# Patient Record
Sex: Female | Born: 1960 | Race: White | Hispanic: No | Smoking: Never smoker
Health system: Southern US, Community
[De-identification: ages and names within clinical notes are randomized; demographics above are authoritative.]

## PROBLEM LIST (undated history)

## (undated) DIAGNOSIS — M1990 Unspecified osteoarthritis, unspecified site: Secondary | ICD-10-CM

## (undated) DIAGNOSIS — R112 Nausea with vomiting, unspecified: Secondary | ICD-10-CM

## (undated) DIAGNOSIS — M419 Scoliosis, unspecified: Secondary | ICD-10-CM

## (undated) DIAGNOSIS — I8390 Asymptomatic varicose veins of unspecified lower extremity: Secondary | ICD-10-CM

## (undated) DIAGNOSIS — R51 Headache: Secondary | ICD-10-CM

## (undated) DIAGNOSIS — D649 Anemia, unspecified: Secondary | ICD-10-CM

## (undated) DIAGNOSIS — R21 Rash and other nonspecific skin eruption: Secondary | ICD-10-CM

## (undated) DIAGNOSIS — G629 Polyneuropathy, unspecified: Secondary | ICD-10-CM

## (undated) DIAGNOSIS — K219 Gastro-esophageal reflux disease without esophagitis: Secondary | ICD-10-CM

## (undated) DIAGNOSIS — M545 Low back pain, unspecified: Secondary | ICD-10-CM

## (undated) HISTORY — DX: Gastro-esophageal reflux disease without esophagitis: K21.9

## (undated) HISTORY — DX: Asymptomatic varicose veins of unspecified lower extremity: I83.90

## (undated) HISTORY — DX: Unspecified osteoarthritis, unspecified site: M19.90

## (undated) HISTORY — DX: Polyneuropathy, unspecified: G62.9

## (undated) HISTORY — PX: THYROID SURGERY: SHX805

## (undated) HISTORY — DX: Rash and other nonspecific skin eruption: R21

## (undated) HISTORY — DX: Low back pain, unspecified: M54.50

## (undated) HISTORY — DX: Scoliosis, unspecified: M41.9

## (undated) HISTORY — DX: Headache: R51

## (undated) HISTORY — DX: Nausea with vomiting, unspecified: R11.2

---

## 1991-04-20 HISTORY — PX: BREAST SURGERY: SHX581

## 2001-04-19 HISTORY — PX: BUNIONECTOMY: SHX129

## 2007-04-20 HISTORY — PX: ESOPHAGOGASTRODUODENOSCOPY: SHX1529

## 2010-08-02 ENCOUNTER — Ambulatory Visit: Admit: 2010-08-02 | Discharge: 2010-08-02 | Payer: Self-pay | Source: Ambulatory Visit

## 2011-02-18 HISTORY — PX: COLONOSCOPY: SHX174

## 2011-05-18 ENCOUNTER — Ambulatory Visit: Payer: BLUE CROSS/BLUE SHIELD | Attending: Foot & Ankle Surgery

## 2011-05-18 DIAGNOSIS — Z3480 Encounter for supervision of other normal pregnancy, unspecified trimester: Secondary | ICD-10-CM | POA: Insufficient documentation

## 2011-05-18 NOTE — Pre-Procedure Instructions (Signed)
FAXED [facility] FOR LAB/EKG/NOTES

## 2011-05-21 HISTORY — PX: ARTHRODESIS: SHX136

## 2011-05-28 NOTE — Pre-Procedure Instructions (Signed)
Labs,EKG HP/med clearance  05/11/11 scan/rev

## 2011-06-01 ENCOUNTER — Ambulatory Visit: Payer: BLUE CROSS/BLUE SHIELD | Admitting: Anesthesiology

## 2011-06-01 ENCOUNTER — Encounter
Admission: RE | Disposition: A | Discharge status: Home / Self Care | Payer: Self-pay | Source: Ambulatory Visit | Attending: Foot & Ankle Surgery

## 2011-06-01 ENCOUNTER — Ambulatory Visit: Payer: BLUE CROSS/BLUE SHIELD

## 2011-06-01 ENCOUNTER — Ambulatory Visit
Admission: RE | Admit: 2011-06-01 | Discharge: 2011-06-01 | Disposition: A | Discharge status: Home / Self Care | Payer: BLUE CROSS/BLUE SHIELD | Source: Ambulatory Visit | Attending: Foot & Ankle Surgery | Admitting: Foot & Ankle Surgery

## 2011-06-01 ENCOUNTER — Encounter: Payer: Self-pay | Admitting: Anesthesiology

## 2011-06-01 ENCOUNTER — Ambulatory Visit: Payer: BLUE CROSS/BLUE SHIELD | Admitting: Foot & Ankle Surgery

## 2011-06-01 DIAGNOSIS — M19079 Primary osteoarthritis, unspecified ankle and foot: Secondary | ICD-10-CM | POA: Insufficient documentation

## 2011-06-01 DIAGNOSIS — M2020 Hallux rigidus, unspecified foot: Secondary | ICD-10-CM | POA: Insufficient documentation

## 2011-06-01 SURGERY — ARTHRODESIS, TOE
Anesthesia: Anesthesia General | Site: Foot | Laterality: Left | Wound class: Clean

## 2011-06-01 MED ORDER — FAMOTIDINE 10 MG/ML IV SOLN
INTRAVENOUS | Status: DC | PRN
Start: 2011-06-01 — End: 2011-06-01
  Administered 2011-06-01: 20 mg via INTRAVENOUS

## 2011-06-01 MED ORDER — MIDAZOLAM HCL 2 MG/2ML IJ SOLN
INTRAMUSCULAR | Status: DC | PRN
Start: 2011-06-01 — End: 2011-06-01
  Administered 2011-06-01: 2 mg via INTRAVENOUS

## 2011-06-01 MED ORDER — MEPERIDINE HCL 25 MG/ML IJ SOLN
12.5000 mg | INTRAMUSCULAR | Status: DC | PRN
Start: 2011-06-01 — End: 2011-06-01

## 2011-06-01 MED ORDER — PROPOFOL 10 MG/ML IV EMUL
INTRAVENOUS | Status: DC | PRN
Start: 2011-06-01 — End: 2011-06-01
  Administered 2011-06-01: 100 mg via INTRAVENOUS

## 2011-06-01 MED ORDER — LACTATED RINGERS IV SOLN
INTRAVENOUS | Status: DC
Start: 2011-06-01 — End: 2011-06-01

## 2011-06-01 MED ORDER — PROPOFOL 10 MG/ML IV EMUL
INTRAVENOUS | Status: DC | PRN
Start: 2011-06-01 — End: 2011-06-01
  Administered 2011-06-01: 140 ug/kg/min via INTRAVENOUS

## 2011-06-01 MED ORDER — FENTANYL CITRATE 0.05 MG/ML IJ SOLN
50.0000 ug | INTRAMUSCULAR | Status: DC | PRN
Start: 2011-06-01 — End: 2011-06-01

## 2011-06-01 MED ORDER — DIPHENHYDRAMINE HCL 50 MG/ML IJ SOLN
12.5000 mg | Freq: Four times a day (QID) | INTRAMUSCULAR | Status: DC | PRN
Start: 2011-06-01 — End: 2011-06-01

## 2011-06-01 MED ORDER — SODIUM CHLORIDE 0.9 % IV MBP
1.0000 g | Freq: Once | INTRAVENOUS | Status: AC
Start: 2011-06-01 — End: 2011-06-01
  Administered 2011-06-01: 1 g via INTRAVENOUS

## 2011-06-01 MED ORDER — LIDOCAINE HCL 2 % IJ SOLN
INTRAMUSCULAR | Status: DC | PRN
Start: 2011-06-01 — End: 2011-06-01
  Administered 2011-06-01: 100 mg

## 2011-06-01 MED ORDER — KETOROLAC TROMETHAMINE 60 MG/2ML IM SOLN
INTRAMUSCULAR | Status: DC | PRN
Start: 2011-06-01 — End: 2011-06-01
  Administered 2011-06-01: 30 mg via INTRAMUSCULAR

## 2011-06-01 MED ORDER — SODIUM CHLORIDE 0.9 % IR SOLN
Status: DC | PRN
Start: 2011-06-01 — End: 2011-06-01
  Administered 2011-06-01: 1000 mL

## 2011-06-01 MED ORDER — PHENYLEPHRINE 100 MCG/ML IV BOLUS (ANESTHESIA)
PREFILLED_SYRINGE | INTRAVENOUS | Status: DC | PRN
Start: 2011-06-01 — End: 2011-06-01
  Administered 2011-06-01 (×3): 100 ug via INTRAVENOUS

## 2011-06-01 MED ORDER — ONDANSETRON HCL 4 MG/2ML IJ SOLN
INTRAMUSCULAR | Status: DC | PRN
Start: 2011-06-01 — End: 2011-06-01
  Administered 2011-06-01: 4 mg via INTRAVENOUS

## 2011-06-01 MED ORDER — BACITRACIN 50000 UNITS IM SOLR
INTRAMUSCULAR | Status: DC | PRN
Start: 2011-06-01 — End: 2011-06-01
  Administered 2011-06-01: 50000 [IU]

## 2011-06-01 MED ORDER — GLYCOPYRROLATE 0.2 MG/ML IJ SOLN
INTRAMUSCULAR | Status: DC | PRN
Start: 2011-06-01 — End: 2011-06-01
  Administered 2011-06-01: 0.2 mg via INTRAVENOUS

## 2011-06-01 MED ORDER — LACTATED RINGERS IV SOLN
INTRAVENOUS | Status: DC
Start: 2011-06-01 — End: 2011-06-01
  Administered 2011-06-01: 1000 mL via INTRAVENOUS

## 2011-06-01 MED ORDER — HYDROMORPHONE HCL PF 1 MG/ML IJ SOLN
0.5000 mg | INTRAMUSCULAR | Status: DC | PRN
Start: 2011-06-01 — End: 2011-06-01

## 2011-06-01 MED ORDER — PROMETHAZINE HCL 25 MG/ML IJ SOLN
6.2500 mg | Freq: Once | INTRAMUSCULAR | Status: DC | PRN
Start: 2011-06-01 — End: 2011-06-01

## 2011-06-01 MED ORDER — FENTANYL CITRATE 0.05 MG/ML IJ SOLN
INTRAMUSCULAR | Status: DC | PRN
Start: 2011-06-01 — End: 2011-06-01
  Administered 2011-06-01 (×2): 50 ug via INTRAVENOUS

## 2011-06-01 MED ORDER — OXYCODONE-ACETAMINOPHEN 5-325 MG PO TABS
1.0000 | ORAL_TABLET | ORAL | Status: DC | PRN
Start: 2011-06-01 — End: 2011-06-01

## 2011-06-01 MED ORDER — DEXAMETHASONE SODIUM PHOSPHATE 4 MG/ML IJ SOLN
INTRAMUSCULAR | Status: DC | PRN
Start: 2011-06-01 — End: 2011-06-01
  Administered 2011-06-01: 6 mg via INTRAVENOUS

## 2011-06-01 MED ORDER — LACTATED RINGERS IV SOLN
INTRAVENOUS | Status: DC | PRN
Start: 2011-06-01 — End: 2011-06-01

## 2011-06-01 MED ORDER — ONDANSETRON HCL 4 MG/2ML IJ SOLN
4.0000 mg | Freq: Once | INTRAMUSCULAR | Status: DC | PRN
Start: 2011-06-01 — End: 2011-06-01

## 2011-06-01 MED ORDER — BUPIVACAINE HCL 0.5 % IJ SOLN
INTRAMUSCULAR | Status: DC | PRN
Start: 2011-06-01 — End: 2011-06-01
  Administered 2011-06-01: 15 mL
  Administered 2011-06-01: 20 mL

## 2011-06-01 SURGICAL SUPPLY — 41 items
BLADE SAGITTAL 9.5X25.5X 4MM (Blade) ×1 IMPLANT
BLADE SHARP 15 DEG (Blade) ×4 IMPLANT
BNDG ACE ELASTIC 4IN STRL (Procedure Accessories) ×1 IMPLANT
BNDG ACE ELASTIC 6IN STRL (Procedure Accessories) ×1 IMPLANT
BUR DENTAL L48 MM OVAL 8 FLUTE OD4 MM BUSA CARBIDE L8 MM (Burr) IMPLANT
BUR RASP CROSSCUT TEAR 9X5MM (Burr) ×1 IMPLANT
BURR DNTL CRB OVL 4MM 48MM 8 FLUT 8MM (Burr) ×2 IMPLANT
CATH IV 14GX1.75IN NLTX (IV Supply) ×1 IMPLANT
DRAPE 84X54IN MINI C ARM OEC 6800 DISPOSABLE (Drape) IMPLANT
DRAPE CARM MN 84X54IN DISP OEC 6800 (Drape) ×2 IMPLANT
DRESSING FLEXZAN 4X4 (Dressing) IMPLANT
GAUZE KERLIX 4.5X4YDS (Dressing) ×1 IMPLANT
GLOVE SRG 6.5 BGL SNSR LTX STRL PF TXTR (Glove) ×2
GLOVE SRG NTR RBR 7 BGL IND 288X91MM LTX (Glove) ×2
GLOVE SURGICAL 6 1/2 BIOGEL SENSOR (Glove) ×1 IMPLANT
GLOVE SURGICAL 6 1/2 BIOGEL SENSOR POWDER FREE TEXTURE BEAD CUFF STRAW (Glove) IMPLANT
GLOVE SURGICAL 7 BIOGEL INDICATOR POWDER (Glove) ×1 IMPLANT
GLOVE SURGICAL 7 BIOGEL INDICATOR POWDER FREE SMOOTH BEAD CUFF (Glove) IMPLANT
GOWN X-LRG POLY REINFORCED (Gown) ×1 IMPLANT
K-WIRE W/BALL 1.6MM (Guide Wire) ×1 IMPLANT
KIT EXTREMITY LOWER *USE 45863 (Kits) ×1 IMPLANT
PAD GROUNDING B-6400 RED (Laser Supplies) ×1 IMPLANT
SCREW BN TI CPTR 4MM 28MM ST SLF DRL CNN (Screw) ×1 IMPLANT
SCREW BN TI CPTR 4MM 34MM ST SLF DRL CNN (Screw) IMPLANT
SCREW BN TI CPTR 4MM 38MM ST SLF DRL CNN (Screw) ×1 IMPLANT
SCREW L28 MM OD4 MM TITANIUM FOREFOOT MIDFOOT SELF TAP SELF DRILL (Screw) IMPLANT
SCREW L34 MM OD4 MM TITANIUM FOREFOOT MIDFOOT SELF TAP SELF DRILL (Screw) IMPLANT
SCREW L38 MM OD4 MM TITANIUM FOREFOOT MIDFOOT SELF TAP SELF DRILL (Screw) IMPLANT
SPLINT PLASTER EFS 5X30IN (Cast) ×1 IMPLANT
SPONGE GAUZE L6 3/4 IN X W6 IN MEDIUM (Dressing) ×1 IMPLANT
SPONGE GAUZE L6 3/4 IN X W6 IN MEDIUM ABSORBENT FLUFF DRY CRINKLE (Dressing) IMPLANT
SPONGE GZE CTTN MED KRLX 6.75X6IN LF (Dressing) ×2
SUTURE ABS 3-0 PS2 VCL MTPS 18IN BRD (Suture) ×2
SUTURE COATED VICRYL 3-0 PS-2 L18 IN (Suture) ×1 IMPLANT
SUTURE COATED VICRYL 3-0 PS-2 L18 IN BRAID COATED UNDYED ABSORBABLE (Suture) IMPLANT
SUTURE MONOCRYL 4-0 PS2 27IN (Suture) ×1 IMPLANT
SUTURE VICRYL 4-0 PS2 27IN (Suture) ×1 IMPLANT
SYRINGE 20 ML BD LUER-LOK MEDICAL (Syringes, Needles) IMPLANT
SYRINGE MED 20ML LL LF STRL (Syringes, Needles) ×2 IMPLANT
TOURNIQUET 34IN STRL (Procedure Accessories) ×1 IMPLANT
TOWEL STERILE REUSABLE 8PK (Procedure Accessories) ×1 IMPLANT

## 2011-06-01 NOTE — Progress Notes (Signed)
Patient signed BETA waiver

## 2011-06-01 NOTE — Anesthesia Preprocedure Evaluation (Addendum)
Anesthesia Evaluation    AIRWAY    Mallampati: II    TM distance: >3 FB  Neck ROM: full  Mouth Opening:full   CARDIOVASCULAR    cardiovascular exam normal     DENTAL    No notable dental hx     PULMONARY    pulmonary exam normal     OTHER FINDINGS          Anesthesia Plan    ASA 2   general   Detailed anesthesia plan: general LMA      Post op pain management: per surgeon  intravenous induction   informed consent obtained        <IHSANLANDD>    Outside labs & EKG reviewed  GERD sx controlled with Dexilant

## 2011-06-01 NOTE — Discharge Instructions (Signed)
Surgeon's  Instructions    1. Keep dressing/operative area clean and dry.  2.   Weight Bearing status:Non weightbearing left foot  3. If swelling, redness, heat, and/or drainage occur at          the incision site, Call Your Doctor.  4. To lessen swelling and pain:  elevate the affected          leg, and apply ice packs over the dressing.  Put the         ice in a plastic bag with a wash cloth around the bag.   5 Low grade fever is normal up to 48 hours after   surgery.  Call  your doctor for any temperature    over 101.  6.   Take pain medication as prescribed  7.   No driving until cleared by your physician   8.   Advance your diet as tolerated      Anesthesia: After Your Surgery  You've just had surgery. During surgery, you received medication called anesthesia to keep you comfortable and pain-free. After surgery, you may experience some pain or nausea. This is normal. Here are some tips for feeling better and recovering after surgery.      Stay on schedule with your medication.   Going Home  Your doctor or nurse will show you how to take care of yourself when you go home. He or she will also answer your questions. Have an adult family member or friend drive you home. For the first 24 hours after your surgery:   Do not drive or use heavy equipment.   Do not make important decisions or sign documents.   Avoid alcohol.   Have someone stay with you, if possible. They can watch for problems and help keep you safe.  Be sure to keep all follow-up doctor's appointments. And rest after your procedure for as long as your doctor tells you to.  Coping with Pain  If you have pain after surgery, pain medication will help you feel better. Take it as directed, before pain becomes severe. Also, ask your doctor or pharmacist about other ways to control pain, such as with heat, ice, and relaxation. And follow any other instructions your surgeon or nurse gives you.  Tips for Taking Pain Medication  To get the best relief  possible, remember these points:   Pain medications can upset your stomach. Taking them with a little food may help.   Most pain relievers taken by mouth need at least 20 to 30 minutes to take effect.   Taking medication on a schedule can help you remember to take it. Try to time your medication so that you can take it before beginning an activity, such as dressing, walking, or sitting down for dinner.   Constipation is a common side effect of pain medications. Drink lots of fluids. Eating fruit and vegetables can also help. Don't take laxatives unless your surgeon has prescribed them.   Mixing alcohol and pain medication can cause dizziness and slow your breathing. It can even be fatal. Don't drink alcohol while taking pain medication.   Pain medication can slow your reflexes. Don't drive or operate machinery while taking pain medication.  Managing Nausea  Some people have an upset stomach after surgery. This is often due to anesthesia, pain, pain medications, or the stress of surgery. The following tips will help you manage nausea and get good nutrition as you recover. If you were on a special diet before  surgery, ask your doctor if you should follow it during recovery. These tips may help:   Don't push yourself to eat. Your body will tell you what to eat and when.   Start off with liquids and soup. They are easier to digest.   Progress to semisolids (mashed potatoes, applesauce, and gelatin) as you feel ready.   Slowly move to solid foods. Don't eat fatty, rich, or spicy foods at first.   Don't force yourself to have three large meals a day. Instead, eat smaller amounts more often.   Take pain medications with a small amount of solid food, such as crackers or toast.  Call Your Surgeon If.   You still have pain an hour after taking medication (it may not be strong enough).   You feel too sleepy, dizzy, or groggy (medication may be too strong).   You have side effects like nausea, vomiting, or skin  changes (rash, itching, or hives).    [address] [place], [place] [address]. All rights reserved. This information is not intended as a substitute for professional medical care. Always follow your healthcare professional's instructions.    GENERAL DISCHARGE INSTRUCTIONS    Things to call your surgeon for:  Persistent Nausea and Vomiting  Chills or a Fever above 101 degrees F  Persistent bleeding, swelling or pus at the operative site  Unable to urinate in 8 hours  Pain that is not relieved by the pain medication.  Loss of feeling or inability to move fingers/toes on the surgical extremity  Blue color of nails or skin on the surgical extremity  Increased coldness of skin on the surgical extremity  Increased swelling of the surgical extremity, especially below the dressing/cast.  Increasing or severe pain, not relieved by your pain medication.You may not drive or do anything requiring coordination or balance for 24 hours.  Rest for the rest of the day.  Avoid heavy lifting for 2 weeks after any surgery.        You may not drink alcohol or consume non-prescribed sedatives or tranquilizers for 24 hours unless approved by your physician.    You should not sign important papers or make important decisions in the next 24 hours.    Please have someone responsible with you the first night you are home.    Keep your dressing/wound site clean and dry.  Do not remove the dressing until advised by your physician.  Wash your hands frequently before and after touching your surgical site.    Begin your diet with clear liquids and progress to your normal diet as long as you are not nauseated. It is suggested that you avoid greasy or spicy foods.    It is suggested that unless specified by the pharmacist, you take all medications with food.  All narcotic type pain medications can cause constipation, keep fluid intake up and increase fiber in your diet.

## 2011-06-01 NOTE — PACU (Signed)
Assisted pt to bathroom, successful at voiding

## 2011-06-01 NOTE — Interval H&P Note (Signed)
H&P reviewed and updated  No changes   OR today for right 1st MPJ fusion  Consents signed  Pt will follow up with Dr. Latanya Presser in one week.    Gaylyn Rong, PGY2

## 2011-06-01 NOTE — Anesthesia Postprocedure Evaluation (Signed)
Anesthesia Post Evaluation    Patient: [name]    Procedures performed: Procedure(s) with comments:  ARTHRODESIS, TOE - LEFT FOOT 1ST MTPJ (METAPHALANGEAL JOINT) ARTHRODESIS    Anesthesia type: General TIVA    Patient location:Phase II PACU    Last vitals:   Filed Vitals:    06/01/11 1202   BP: 109/66   Pulse: 86   Temp: 97 F (36.1 C)   Resp: 16       Post pain: Patient not complaining of pain, continue current therapy     Mental Status:awake    Respiratory Function: tolerating room air    Cardiovascular: stable    Nausea/Vomiting: patient not complaining of nausea or vomiting    Hydration Status: adequate    Post assessment: no apparent anesthetic complications

## 2011-06-01 NOTE — Transfer of Care (Signed)
Anesthesia Transfer of Care Note    Patient: [name]    Procedures performed: Procedure(s) with comments:  ARTHRODESIS, TOE - LEFT FOOT 1ST MTPJ (METAPHALANGEAL JOINT) ARTHRODESIS    Anesthesia type: General TIVA    Patient location:Phase II PACU    Last vitals:   Filed Vitals:    06/01/11 1201   BP: 109/66   Pulse: 98   Temp:    Resp: 16       Post pain: Patient not complaining of pain, continue current therapy     Mental Status:awake    Respiratory Function: tolerating room air    Cardiovascular: stable    Nausea/Vomiting: patient not complaining of nausea or vomiting    Hydration Status: adequate    Post assessment: no apparent anesthetic complications

## 2011-06-01 NOTE — Brief Op Note (Signed)
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.2] left foot    Procedure:   Left 1st MPJ arthrodesis      Anesthesia:    General   [name], MD   Anesthesiologist: [name], MD  CRNA: Naida Sleight, CRNA     Estimated Blood Loss:   Less than 10 cc    Hemostasis: ankle tourniquet at    Implants:     Implants     Screw    Integra Screw - IMPLANTED     Inventory item:  Model/Cat no.: [id number]    Serial no.:  Manufacturer:     Lot no.:  Size:           Screw Low Pro Canu 4.0x46mm - [id number] - IMPLANTED     Inventory item: Screw Low Pro Canu 4.0x61mm Model/Cat no.: [id number]    Serial no.:  Manufacturer: [employer]    Lot no.:  Size:                    Injectables: 20cc 0.5%marcaine preop                        15cc 0.5% marcaine postop    Pathology: poor bone stock of 1st metatarsal head    Antibiotics:   Ancef 1g IV    Complications:   None.    Condition:   Stable to PACU                                                                            Geraldine Solar Keir Viernes

## 2011-06-01 NOTE — PACU (Signed)
Assisted pt to bathroom, successful at voiding. Pt tolerating coke and crackers

## 2011-06-04 NOTE — Op Note (Signed)
Procedure Date: 06/01/2011     Patient Type: A     SURGEON: MyHoa Kaas DPM  ASSISTANT:  Geraldine Solar Lavel Rieman DPM     PREOPERATIVE DIAGNOSIS:  1.  Hallux rigidus, left foot.    2.  Left foot arthritis.     POSTOPERATIVE DIAGNOSIS:  1.  Hallux rigidus, left foot.    2.  Left foot arthritis.     TITLE OF PROCEDURE:  Left first metatarsophalangeal joint arthrodesis.     ANESTHESIA:  General.     ESTIMATED BLOOD LOSS:  Less than 10 mL.     HEMOSTASIS:  Ankle tourniquet at 200 mmHg.     MATERIALS:  4.0 x 38 mm Integra screw and 4.0 x 28 mm Integra screw and 0.062 K-wire.     ANTIBIOTICS:  Ancef 1 gram.     COMPLICATIONS:  None.     CONDITION:  Stable to PACU.     INDICATION FOR PROCEDURE:  The patient is well known to the practice of Dr. Latanya Presser.  All conservative  measures have failed for painful first metatarsophalangeal joint and  conservative measures have been advised.  All risks, complications,  advantages and disadvantages have been thoroughly discussed.  All consents  have been signed preoperatively.     DESCRIPTION OF PROCEDURE:  The patient was brought into the operating room, placed on the operating  table in supine position.  The patient was then placed under general  anesthesia.  An ankle tourniquet was placed around the left ankle and a  preoperative injection of 20 mL of 0.5% Marcaine plain was injected into  the left foot in a standard Mayo blocking fashion.  The left foot was  scrubbed, prepped and draped in usual aseptic fashion.  The extremity was elevated for 2 minutes  and the ankle tourniquet was inflated to 200 mmHg.     Using a 15 blade, the skin was incised, careful to avoid any neurovascular  structures over the dorsal aspect of the first metatarsophalangeal joint.   The incision was then deepened down to the level of bone and capsular  tissue was reflected from the first metatarsal head and base of the  proximal phalanx.  Extremely denuded the cartilage of the first metatarsal  head was noted  along with large osteophytes.  Very poor bone quality was  visualized intraoperatively.  A curette was used to remove all cartilage  from the head of the first metatarsal as well as the base of the proximal  phalanx.  Curettage was carried out to the level of the subchondral plate.   The power bur was then used to further resect any cartilage.  A 0.045  K-wire was used to fenestrate the head of the first metatarsal and base of  the proximal phalanx.  The toe was then placed into proper fusion position  which was approximately 15 degrees of dorsiflexion and 10 degrees of  abduction and 0 degrees of rotation in the frontal plane.  A 0.062 K-wire  was thrown from distal to proximal percutaneously to stabilize the fusion  site for fixation.  Using fluoroscopy, the fusion site was visualized and  noted to be in good apposition.  Next, a 4.0 x 38 mm Integra screw was  inserted from proximal medial to distal lateral, using the standard AO  technique.  Good compression across the osteotomy site was achieved but the  bone was noted to be extremely soft.  The second screw from proximal  lateral to distal plantar  was then inserted again using standard AO  technique.  Again, fluoroscopy was used to check placement of the screws  and adequate correction and apposition was achieved.  Due to the poor  quality of the bone, the K-wire was used as a guidepin was left in place  and will be pulled later date and Dr. Latanya Presser' office.     The wounds were then flushed with copious amounts of normal saline and  reinspected for closure.  Capsular tissue was reapproximated using 3-0  Vicryl, subcutaneous using 4-0 Vicryl, and skin closure was achieved using  4-0 Monocryl in a subcuticular running fashion.  The ankle tourniquet was  dropped and a hyperemic response was noted to all digits of the left foot.   The wounds were dressed with silk soaked in Betadine, 4 x 4 gauze, Kerlix  and a short plaster splint was applied with Ace bandages.  The  patient was  transferred to PACU with vital signs stable and vascular status intact to  all digits of the left foot.  Following a period of postoperative  monitoring, the patient will be discharged home in stable condition.  She  will follow up with Dr. Latanya Presser next week.           D:  06/03/2011 18:01 PM by Dr. Geraldine Solar. [place], DPM [phone])  T:  06/04/2011 11:56 AM by [facility]      Everlean Cherry: [id number]) (Doc ID: [id number])

## 2011-08-16 ENCOUNTER — Encounter (INDEPENDENT_AMBULATORY_CARE_PROVIDER_SITE_OTHER): Payer: Self-pay

## 2011-08-17 ENCOUNTER — Telehealth: Payer: BLUE CROSS/BLUE SHIELD

## 2011-08-17 NOTE — Pre-Procedure Instructions (Signed)
Will have pre-op labs at [facility] tomorrow --requested by fax

## 2011-08-30 NOTE — Pre-Procedure Instructions (Signed)
Called surgeon's office requesting/lab done 08/18/11 and H&P

## 2011-08-30 NOTE — Pre-Procedure Instructions (Signed)
Rev/Scanned/Lab/Old H&P

## 2011-08-31 ENCOUNTER — Ambulatory Visit: Payer: BLUE CROSS/BLUE SHIELD | Admitting: Foot & Ankle Surgery

## 2011-08-31 ENCOUNTER — Encounter: Payer: Self-pay | Admitting: Anesthesiology

## 2011-08-31 ENCOUNTER — Ambulatory Visit: Payer: BLUE CROSS/BLUE SHIELD | Admitting: Anesthesiology

## 2011-08-31 ENCOUNTER — Other Ambulatory Visit: Payer: BLUE CROSS/BLUE SHIELD | Admitting: Radiology

## 2011-08-31 ENCOUNTER — Other Ambulatory Visit: Payer: BLUE CROSS/BLUE SHIELD

## 2011-08-31 ENCOUNTER — Encounter
Admission: RE | Disposition: A | Discharge status: Home / Self Care | Payer: Self-pay | Source: Ambulatory Visit | Attending: Foot & Ankle Surgery

## 2011-08-31 ENCOUNTER — Ambulatory Visit
Admission: RE | Admit: 2011-08-31 | Discharge: 2011-08-31 | Disposition: A | Discharge status: Home / Self Care | Payer: BLUE CROSS/BLUE SHIELD | Source: Ambulatory Visit | Attending: Foot & Ankle Surgery | Admitting: Foot & Ankle Surgery

## 2011-08-31 DIAGNOSIS — Y831 Surgical operation with implant of artificial internal device as the cause of abnormal reaction of the patient, or of later complication, without mention of misadventure at the time of the procedure: Secondary | ICD-10-CM | POA: Insufficient documentation

## 2011-08-31 DIAGNOSIS — T8489XA Other specified complication of internal orthopedic prosthetic devices, implants and grafts, initial encounter: Secondary | ICD-10-CM | POA: Insufficient documentation

## 2011-08-31 DIAGNOSIS — M79609 Pain in unspecified limb: Secondary | ICD-10-CM | POA: Insufficient documentation

## 2011-08-31 DIAGNOSIS — Z882 Allergy status to sulfonamides status: Secondary | ICD-10-CM | POA: Insufficient documentation

## 2011-08-31 LAB — POCT PREGNANCY TEST, URINE HCG: POCT Pregnancy HCG Test, UR: NEGATIVE

## 2011-08-31 SURGERY — REMOVAL, HARDWARE, SIMPLE, NON-SPINE
Anesthesia: Anesthesia General | Site: Foot | Laterality: Left | Wound class: Clean

## 2011-08-31 MED ORDER — PROPOFOL INFUSION 10 MG/ML
INTRAVENOUS | Status: DC | PRN
Start: 2011-08-31 — End: 2011-08-31
  Administered 2011-08-31: 100 ug/kg/min via INTRAVENOUS

## 2011-08-31 MED ORDER — SODIUM CHLORIDE 0.9 % IR SOLN
Status: DC | PRN
Start: 2011-08-31 — End: 2011-08-31
  Administered 2011-08-31: 250 mL

## 2011-08-31 MED ORDER — ONDANSETRON HCL 4 MG/2ML IJ SOLN
INTRAMUSCULAR | Status: AC
Start: 2011-08-31 — End: 2011-08-31
  Administered 2011-08-31: 4 mg via INTRAVENOUS
  Filled 2011-08-31: qty 2

## 2011-08-31 MED ORDER — HYDROMORPHONE HCL 2 MG PO TABS
2.0000 mg | ORAL_TABLET | ORAL | Status: DC | PRN
Start: 2011-08-31 — End: 2011-08-31

## 2011-08-31 MED ORDER — CEFAZOLIN SODIUM 1 G IJ SOLR
1.00 g | INTRAMUSCULAR | Status: DC
Start: 2011-08-31 — End: 2011-08-31
  Administered 2011-08-31: 1 g via INTRAVENOUS

## 2011-08-31 MED ORDER — FENTANYL CITRATE 0.05 MG/ML IJ SOLN
25.0000 ug | Freq: Once | INTRAMUSCULAR | Status: AC
Start: 2011-08-31 — End: 2011-08-31

## 2011-08-31 MED ORDER — HYDROMORPHONE HCL PF 1 MG/ML IJ SOLN
0.5000 mg | INTRAMUSCULAR | Status: DC | PRN
Start: 2011-08-31 — End: 2011-08-31

## 2011-08-31 MED ORDER — BUPIVACAINE HCL 0.5 % IJ SOLN
INTRAMUSCULAR | Status: DC | PRN
Start: 2011-08-31 — End: 2011-08-31
  Administered 2011-08-31 (×2): 10 mL

## 2011-08-31 MED ORDER — LACTATED RINGERS IV SOLN
INTRAVENOUS | Status: DC
Start: 2011-08-31 — End: 2011-08-31
  Administered 2011-08-31: 1000 mL via INTRAVENOUS

## 2011-08-31 MED ORDER — FENTANYL CITRATE 0.05 MG/ML IJ SOLN
INTRAMUSCULAR | Status: DC | PRN
Start: 2011-08-31 — End: 2011-08-31
  Administered 2011-08-31: 25 ug via INTRAVENOUS

## 2011-08-31 MED ORDER — ONDANSETRON HCL 4 MG/2ML IJ SOLN
4.0000 mg | Freq: Once | INTRAMUSCULAR | Status: DC | PRN
Start: 2011-08-31 — End: 2011-08-31

## 2011-08-31 MED ORDER — MIDAZOLAM HCL 2 MG/2ML IJ SOLN
INTRAMUSCULAR | Status: DC | PRN
Start: 2011-08-31 — End: 2011-08-31
  Administered 2011-08-31: 2 mg via INTRAVENOUS

## 2011-08-31 MED ORDER — FENTANYL CITRATE 0.05 MG/ML IJ SOLN
INTRAMUSCULAR | Status: AC
Start: 2011-08-31 — End: 2011-08-31
  Administered 2011-08-31: 25 ug via INTRAVENOUS
  Filled 2011-08-31: qty 2

## 2011-08-31 MED ORDER — PROPOFOL INFUSION 10 MG/ML
INTRAVENOUS | Status: DC | PRN
Start: 2011-08-31 — End: 2011-08-31
  Administered 2011-08-31: 100 mg via INTRAVENOUS

## 2011-08-31 MED ORDER — FENTANYL CITRATE 0.05 MG/ML IJ SOLN
25.0000 ug | INTRAMUSCULAR | Status: DC | PRN
Start: 2011-08-31 — End: 2011-08-31

## 2011-08-31 MED ORDER — LIDOCAINE HCL 2 % IJ SOLN
INTRAMUSCULAR | Status: DC | PRN
Start: 2011-08-31 — End: 2011-08-31
  Administered 2011-08-31: 60 mg

## 2011-08-31 MED ORDER — PROMETHAZINE HCL 25 MG/ML IJ SOLN
6.2500 mg | Freq: Once | INTRAMUSCULAR | Status: DC | PRN
Start: 2011-08-31 — End: 2011-08-31

## 2011-08-31 MED ORDER — PHENYLEPHRINE 100 MCG/ML IV BOLUS (ANESTHESIA)
PREFILLED_SYRINGE | INTRAVENOUS | Status: DC | PRN
Start: 2011-08-31 — End: 2011-08-31
  Administered 2011-08-31 (×2): 100 ug via INTRAVENOUS

## 2011-08-31 MED ORDER — LACTATED RINGERS IV SOLN
20.0000 mL/h | INTRAVENOUS | Status: DC | PRN
Start: 2011-08-31 — End: 2011-08-31

## 2011-08-31 SURGICAL SUPPLY — 49 items
APPLCATOR CHLORAPREP 26ML (Prep) IMPLANT
BANDAGE CMPR PLSTR CTTN PRCR 5YDX4IN LF (Procedure Accessories) ×2
BANDAGE PROCARE COMP L5 YD X W4 IN 2 CLIP FASTENER SLF CLSR PLSTR CTTN (Procedure Accessories) ×1 IMPLANT
BANDAGE PROCARE COMPRESSION L5 YD X W4 (Procedure Accessories) ×1 IMPLANT
BLADE S/SU RIBBACK CARB STL 15 (Blade) ×4 IMPLANT
CATH IV 14GX1.75IN NLTX (IV Supply) ×2 IMPLANT
DRAPE SRG TBRN CNVRT 108X77IN STRL REINF (Drape) ×2
DRAPE SURGICAL REINFORCE SPLIT (Drape) ×1 IMPLANT
DRAPE SURGICAL REINFORCE SPLIT IMPERVIOUS L108 IN X W77 IN CONVERTORS (Drape) ×1 IMPLANT
DRESSING OWENS GZE STRL 3X8IN (Dressing) ×1
DRESSING WND DERMACEA 8X3IN LF STRL NADH (Dressing) ×1 IMPLANT
DRESSING WOUND 8X3IN DERMACEA NONADHSV LF STRL DSPSBL (Dressing) ×1 IMPLANT
GAUZE KERLIX 4.5X4YDS (Dressing) ×2 IMPLANT
GLOVE SRG NTR RBR 7 BGL IND 288X91MM LTX (Glove) ×2
GLOVE SURG BIOGEL SZ6.5 (Glove) ×2 IMPLANT
GLOVE SURGICAL 7 BIOGEL INDICATOR POWDER (Glove) ×1 IMPLANT
GLOVE SURGICAL 7 BIOGEL INDICATOR POWDER FREE SMOOTH BEAD CUFF (Glove) ×1 IMPLANT
INACTIVE USE LAWSON 112416 (Drape) ×2 IMPLANT
INACTIVE USE LAWSON 120900 (Gown) ×4 IMPLANT
NEEDLE 25GA 1 1/2 (Needles) ×2 IMPLANT
NEEDLE REG BEVEL 19GX1.5IN (Needles) ×2 IMPLANT
PACK DRAPE C-ARM FIT MINI-6600 (Pack) ×2 IMPLANT
PAD ELECTROSRG GRND REM W CRD (Procedure Accessories) ×2 IMPLANT
PAD PREP CUFF 24X41IN W 9IN (Prep) ×2 IMPLANT
SLEEVE SEQUEN COMP KNEE REG (Procedure Accessories) ×2 IMPLANT
SOL NACL .9% IRRIG 250ML NLTX (IV Solutions) ×1
SOLUTION IRR 0.9% NACL 250ML LF STRL PLS (IV Solutions) ×1
SOLUTION IRRIGATION 0.9% SODIUM CHLORIDE (IV Solutions) ×1 IMPLANT
SOLUTION IRRIGATION 0.9% SODIUM CHLORIDE 250 ML PLASTIC POUR BOTTLE (IV Solutions) ×1 IMPLANT
SPONGE GAUZE L4 IN X W4 IN 16 PLY (Dressing) ×1 IMPLANT
SPONGE GAUZE L4 IN X W4 IN 16 PLY MAXIMUM ABSORBENT USP TYPE VII (Dressing) IMPLANT
SPONGE GAUZE L6 3/4 IN X W6 IN MEDIUM (Dressing) ×1 IMPLANT
SPONGE GAUZE L6 3/4 IN X W6 IN MEDIUM ABSORBENT FLUFF DRY CRINKLE (Dressing) ×1 IMPLANT
SPONGE GZE CTTN CRTY 4X4IN LF NS 16 PLY (Dressing) ×2
SPONGE GZE CTTN MED KRLX 6.75X6IN LF (Dressing) ×2
STRIP SKIN CLOSURE L4 IN X W1/4 IN (Dressing) ×1 IMPLANT
STRIP SKIN CLOSURE L4 IN X W1/4 IN REINFORCE STERI-STRIP POLYESTER (Dressing) ×1 IMPLANT
STRIP SKNCLS PLSTR STRSTRP 4X.25IN LF (Dressing) ×1
STRIP STRSTRP SKNCLS 4X.25IN PLSTR REINF (Dressing) ×1
SUTURE MONOCRYL 4-0 PS2 27IN (Suture) ×2 IMPLANT
SUTURE VICRYL 3-0 PS2 27IN (Suture) ×2 IMPLANT
SUTURE VICRYL 4-0 PS2 27IN (Suture) ×2 IMPLANT
SYRINGE 20 ML BD LUER-LOK MEDICAL (Syringes, Needles) ×1 IMPLANT
SYRINGE LUER LOCK 10CC (Syringes, Needles) ×2 IMPLANT
SYRINGE MED 20ML LL LF STRL (Syringes, Needles) ×2 IMPLANT
TOURNIQUET 18IN STRL (Procedure Accessories) ×2 IMPLANT
TOURNIQUET 34IN STRL (Procedure Accessories) ×2 IMPLANT
TOWEL STERILE 6-PACK (Procedure Accessories) ×2 IMPLANT
TRAY EXTREMITY PACK (Pack) ×2 IMPLANT

## 2011-08-31 NOTE — Brief Op Note (Signed)
BRIEF OP NOTE    Date Time: 08/31/2011 2:06 PM    Patient Name:   [name]    Date of Operation:   08/31/2011    Providers Performing:   Surgeon(s):  Myhoa Latanya Presser, DPM    Assistant (s):   Earna Coder PGY1  Jimmey Ralph, RN - Circulator  Nicholos Johns Czesniuk - Scrub Person  Antoine Primas, RN - Second Circulator    Operative Procedure:   Procedure(s):  Left foot REMOVAL, HARDWARE, SIMPLE, NON-SPINE    Preoperative Diagnosis:   Pre-Op Diagnosis Codes:     * Unspecified mechanical complication of internal orthopedic device, implant, and graft [996.40]    Postoperative Diagnosis:    Unspecified mechanical complication of internal orthopedic device, implant, and graft [996.40]    Anesthesia:   Monitor Anesthesia Care    Estimated Blood Loss:    minimal    Implants:   none    Drains:   none    Specimens:   Screws x 2     Findings:   See full op report    Complications:   none      Signed by: Earna Coder, DPM                                                                           [facility] OR

## 2011-08-31 NOTE — H&P (Signed)
PODIATRIC SURGERY HISTORY AND PHYSICAL   Spectra: [id number]   Pager: [phone]  Attending: Dr. Latanya Presser  Date/Time: 08/31/2011 1:12 PM    08/31/2011 1:12 PM    History of Presenting Illness:    [name] is a 51 y.o. year old female with pmh of migraines, GERD and spinal stenosis (no focal deficits) presents today for removal of hardware from Left foot 1st MTPJ. Had the fusion done on Feb of 2013, now it's painful. Complains of migraine today.     Past Medical History:     Past Medical History   Diagnosis Date   . PONV (postoperative nausea and vomiting)    . GERD (gastroesophageal reflux disease)    . Low back pain    . Arthritis      THUMB   . Headache      MIGRAINES   . Rash      PSORIASIS     Past Surgical History:     Past Surgical History   Procedure Date   . Bunionectomy 2003   . Esophagogastroduodenoscopy      X3   . Colonoscopy 02/2011   . Thyroid surgery age 60     benign mass removed   . Arthrodesis Feb 2013     Left foot     Family History:   History reviewed. No pertinent family history.  Social History:     History     Social History   . Marital Status: Single     Spouse Name: N/A     Number of Children: N/A   . Years of Education: N/A     Social History Main Topics   . Smoking status: Never Smoker    . Smokeless tobacco: Never Used   . Alcohol Use: 1.8 oz/week     3 Glasses of wine per week   . Drug Use: No   . Sexually Active:      Other Topics Concern   . Not on file     Social History Narrative   . No narrative on file     Allergies:     Allergies   Allergen Reactions   . Sulfa Antibiotics Rash and Fever     Medications:     Prior to Admission medications    Medication Sig Start Date End Date Taking? Authorizing Provider   CALCIUM PO Take 1 tablet by mouth daily. CHEWABLE    Yes Historical Provider, MD   carisoprodol (SOMA) 250 MG tablet Take 250 mg by mouth nightly.     Yes Historical Provider, MD   clobetasol (OLUX) 0.05 % topical foam Apply topically as needed.     Yes Historical Provider, MD    dexlansoprazole 60 MG capsule Take 60 mg by mouth daily.    Yes Historical Provider, MD   Diclofenac Epolamine (FLECTOR) 1.3 % PTCH Place onto the skin daily.   Yes Historical Provider, MD   Diclofenac Sodium (VOLTAREN) 1 % GEL Apply topically as needed.     Yes Historical Provider, MD   gabapentin (NEURONTIN) 300 MG capsule Take 300 mg by mouth 3 (three) times daily. 2 SCHEDULED DOSES WITH 1 AS NEEDED 3RD DOSE    Yes Historical Provider, MD   Multiple Vitamins-Minerals (MULTIVITAMIN PO) Take 1 tablet by mouth daily.     Yes Historical Provider, MD   SUMAtriptan (IMITREX) 100 MG tablet Take 100 mg by mouth as needed.     Yes Historical Provider, MD   SUMAtriptan Succinate (IMITREX  IJ) Inject 4 mg as directed as needed.     Yes Historical Provider, MD   Diclofenac Potassium (CAMBIA) 50 MG PACK Take 1 packet by mouth as needed.      Historical Provider, MD     Review of Systems:   ROS performed and patient reports all pertinent systems negative  Physical Exam:   Blood pressure 123/65, pulse 87, temperature 98.3 F (36.8 C), temperature source Temporal Artery, resp. rate 16, height 1.702 m (5\' 7" ), weight 67.132 kg (148 lb), last menstrual period 09/18/2010, SpO2 99.00%.  67.132 kg (148 lb)   1.702 m (5\' 7" )  Estimated Body mass index is 23.18 kg/(m^2) as calculated from the following:    Height as of this encounter: 5\' 7" (1.702 m).    Weight as of this encounter: 148 lb(67.132 kg).  Constitutional: Well-appearing, NAD  Chest: Normal chest rise, CTAB  Cardiovascular: Regular rate rhythm, S1, S2  Abdomen: Soft, non-tender, +BS  Neuro: Alert, oriented  Focused LLE Exam:   Vascular: Palpable DP/PT, Dopplerable signals,  CFT < 3 secs, - edema   Derm: Supple skin, no open lesions. Prominent HW over the 1st MTPJ  Ortho: Gross motor function intact. + tenderness over 1st MPJ.  Neuro: Light touch and Protective sensation intact.    LABORATORY RESULTS:   Hematology (most recent):  No results found for this basename: WBC,  HCT, HGB, LABPLAT   Chemistry (most recent):  No results found for this basename: NA, K, CL, CO2, BUN, CREAT, GLU, CA, MG, PHOS   Coagulation Profile (most recent):  No results found for this basename: PT, PTT, INR   Hepatic Profile (most recent):  No results found for this basename: ALP, AST, ALT, BILIT, BILID, BILII   Nutrition Profile (last two values):  No results found for this basename: PREALBUMIN, ALB     Pathology  Specimens     None        RADIOLOGY:   Relevant, recent radiological imaging results reviewed.  CHART DOCUMENTED PROBLEM LIST:   Painful hardware  Assessment & Plan:   [name] is a 51 y.o. female    Patient has been evaluated and is deemed safe to proceed forward with the procedure discussed.   The patient has been given ample opportunity to ask appropriate questions and those questions have been answered to their satisfaction.   The consent form has been signed, witnessed and placed into the chart...  ______________________________    Earna Coder, DPM  Podiatric Surgery Resident, PGY-1  [phone]  ______________________________

## 2011-08-31 NOTE — Anesthesia Preprocedure Evaluation (Signed)
Anesthesia Evaluation    AIRWAY    Mallampati: II    TM distance: <3 FB  Neck ROM: limited  Mouth Opening:full   CARDIOVASCULAR    cardiovascular exam normal     DENTAL    No notable dental hx     PULMONARY    pulmonary exam normal     OTHER FINDINGS              Anesthesia Plan    ASA 2   general   Detailed anesthesia plan: general IV      Post op pain management: per surgeon  intravenous induction   informed consent obtained    Plan discussed with CRNA.

## 2011-08-31 NOTE — Anesthesia Postprocedure Evaluation (Signed)
Anesthesia Post Evaluation    Patient: [name]    Procedures performed: Procedure(s) with comments:  REMOVAL, HARDWARE, SIMPLE, NON-SPINE - LEFT FOOT HARDWARE REMOVAL X2    Anesthesia type: General TIVA    Patient location:Phase II PACU    Last vitals:   Filed Vitals:    08/31/11 1434   BP: 112/71   Pulse: 80   Temp:    Resp: 16   SpO2: 99%       Post pain: Continue adjustment of pain medication;      Mental Status:awake and alert     Respiratory Function: toleratinig nasal cannula    Cardiovascular: stable    Nausea/Vomiting: patient not complaining of nausea or vomiting    Hydration Status: adequate    Post assessment: no apparent anesthetic complications

## 2011-08-31 NOTE — Op Note (Signed)
Procedure Date: 08/31/2011     Patient Type: A     SURGEON: MyHoa Kaas DPM  ASSISTANT:  Earna Coder DPM     PREOPERATIVE DIAGNOSIS:  Left foot painful hardware at the first metatarsophalangeal joint.     POSTOPERATIVE DIAGNOSIS:  Left foot painful hardware at the first metatarsophalangeal joint.     TITLE OF PROCEDURE:  Left foot hardware removalX2     PATHOLOGY:  Two screws were sent to pathology as a specimen.     ANESTHESIA:  Monitored anesthesia care with local.     HEMOSTASIS:  A left ankle tourniquet was utilized at 200 mmHg.     ESTIMATED BLOOD LOSS:  Minimal.     MATERIALS USED:  None.     INJECTABLES:  10 mL of 0.5% Marcaine plain.     COMPLICATIONS:  None.     CONDITION:  Stable.     ANTIBIOTICS:  The patient received 1 g of Ancef preoperatively.     INDICATIONS FOR PROCEDURE:  The patient is a 51 year old female with past medical history of migraines,  GERD, and spinal stenosis with no focal defects, who had a left foot first  metatarsophalangeal joint fusion in February of 2013.  The patient has had  painful prominent hardware at the metatarsophalangeal joint and has elected  for surgical removal of the hardware.  It was noted that the patient had  osseous fusion at the first metatarsophalangeal joint before hardware  removal was suggested.  All risks, benefits, potential complications, and  the potential outcomes were discussed with the patient and the patient  signed informed surgical consent.     DESCRIPTION OF PROCEDURE:  The patient was brought into the operating room and placed on the operating  table in the supine position.  Following IV sedation, local anesthesia was  then administered about the operative site in a local infiltrative block  fashion utilizing approximately 10 mL of 0.5% Marcaine plain.  A left  pneumatic ankle tourniquet was then placed in the supramalleolar position.   The left foot was then prepped and draped in the usual sterile manner.   Following exsanguination, the left  ankle tourniquet was then inflated to  200 mmHg.  The following procedure began.     Attention was directed to the dorsal aspect of the first  metatarsophalangeal joint, where approximately a 2-cm linear longitudinal  incision was made along the previous scar directly dorsal to the prominent  screws beneath.  The incision was then deepened to subcutaneous tissue with  care being taken to retract all vital neurovascular structures.  Hemostasis  was achieved via electrocautery.  The subcutaneous tissues were then  reflected medially and laterally, thus exposing the head of the first screw  medially.  The screw was then freed of all of its soft tissue ligament and  osseous attachments.  A screwdriver was then utilized to remove the first  screw from the operative site.  It was noted that the screw was _____  intact.  Next, the soft tissue were then reflected laterally utilizing  blunt dissection.  The head of the second screw was then visualized.  The  screw head was then freed of its soft tissue and osseous attachments.  A  screwdriver was then utilized to remove the second screw from the operative  field in its entirety.  The area was then flushed with copious amounts of  sterile normal saline.  The skin was then reapproximated utilizing 4-0  Prolene in a continuous  running suture technique.     Upon completion of the procedure, the incision was then dressed with  Betadine-soaked Owen's silk and dressed with sterile compressive dressing  consisting of 4 x 4's and Kerlix.  The pneumatic ankle tourniquet was then  deflated and prompt hyperemic response was noted to all digits of the left  foot.  Ace wrap was then applied in avoidance of postoperative hematoma.     The patient tolerated the procedure and anesthesia well.  She was  transferred to the recovery room with vital signs stable and vascular  status intact to all toes of the left foot.  Following a period of  postoperative monitoring, the patient will be  discharged home with written  and oral postoperative instructions.  The patient is to follow up at the  office of Dr. Latanya Presser for the entirety of the postoperative period.        D:  08/31/2011 14:55 PM by Dr. Earna Coder, DPM [phone])  T:  08/31/2011 23:41 PM by [id number]      Everlean Cherry: [id number]) (Doc ID: [id number])

## 2011-08-31 NOTE — Discharge Instructions (Signed)
Surgeon's  Instructions    1. Keep dressing/operative area clean and dry.  2.   Weight Bearing status: Weightbearing as tolerated to Left foot with surgical shoe  3. If swelling, redness, heat, and/or drainage occur at          the incision site, Call Your Doctor.  4. To lessen swelling and pain:  elevate the affected          leg, and apply ice packs over the dressing.  Put the         ice in a plastic bag with a wash cloth around the bag.   5 Low grade fever is normal up to 48 hours after   surgery.  Call  your doctor for any temperature    over 101.  6.   Take pain medication as prescribed  7.   No driving until cleared by your physician   8.   Advance your diet as tolerated      Anesthesia: After Your Surgery  You've just had surgery. During surgery, you received medication called anesthesia to keep you comfortable and pain-free. After surgery, you may experience some pain or nausea. This is normal. Here are some tips for feeling better and recovering after surgery.     Stay on schedule with your medication.   Going Home  Your doctor or nurse will show you how to take care of yourself when you go home. He or she will also answer your questions. Have an adult family member or friend drive you home. For the first 24 hours after your surgery:   Do not drive or use heavy equipment.   Do not make important decisions or sign documents.   Avoid alcohol.   Have someone stay with you, if possible. They can watch for problems and help keep you safe.  Be sure to keep all follow-up doctor's appointments. And rest after your procedure for as long as your doctor tells you to.  Coping with Pain  If you have pain after surgery, pain medication will help you feel better. Take it as directed, before pain becomes severe. Also, ask your doctor or pharmacist about other ways to control pain, such as with heat, ice, and relaxation. And follow any other instructions your surgeon or nurse gives you.  Tips for Taking Pain  Medication  To get the best relief possible, remember these points:   Pain medications can upset your stomach. Taking them with a little food may help.   Most pain relievers taken by mouth need at least 20 to 30 minutes to take effect.   Taking medication on a schedule can help you remember to take it. Try to time your medication so that you can take it before beginning an activity, such as dressing, walking, or sitting down for dinner.   Constipation is a common side effect of pain medications. Drink lots of fluids. Eating fruit and vegetables can also help. Don't take laxatives unless your surgeon has prescribed them.   Mixing alcohol and pain medication can cause dizziness and slow your breathing. It can even be fatal. Don't drink alcohol while taking pain medication.   Pain medication can slow your reflexes. Don't drive or operate machinery while taking pain medication.  Managing Nausea  Some people have an upset stomach after surgery. This is often due to anesthesia, pain, pain medications, or the stress of surgery. The following tips will help you manage nausea and get good nutrition as you recover. If you were  on a special diet before surgery, ask your doctor if you should follow it during recovery. These tips may help:   Don't push yourself to eat. Your body will tell you what to eat and when.   Start off with liquids and soup. They are easier to digest.   Progress to semisolids (mashed potatoes, applesauce, and gelatin) as you feel ready.   Slowly move to solid foods. Don't eat fatty, rich, or spicy foods at first.   Don't force yourself to have three large meals a day. Instead, eat smaller amounts more often.   Take pain medications with a small amount of solid food, such as crackers or toast.  Call Your Surgeon If.   You still have pain an hour after taking medication (it may not be strong enough).   You feel too sleepy, dizzy, or groggy (medication may be too strong).   You have side  effects like nausea, vomiting, or skin changes (rash, itching, or hives).    [address], [place] [address]. All rights reserved. This information is not intended as a substitute for professional medical care. Always follow your healthcare professional's instructions.

## 2011-08-31 NOTE — Transfer of Care (Signed)
Anesthesia Transfer of Care Note    Patient: [name]    Procedures performed: Procedure(s) with comments:  REMOVAL, HARDWARE, SIMPLE, NON-SPINE - LEFT FOOT HARDWARE REMOVAL X2    Anesthesia type: General TIVA    Patient location:Phase II PACU    Last vitals:   Filed Vitals:    08/31/11 1408   BP: 94/61   Pulse: 82   Temp:    Resp: 16   SpO2: 99%       Post pain: Patient not complaining of pain, continue current therapy     Mental Status:awake and alert     Respiratory Function: tolerating room air    Cardiovascular: stable    Nausea/Vomiting: patient not complaining of nausea or vomiting    Hydration Status: adequate    Post assessment: no apparent anesthetic complications and no reportable events

## 2011-09-01 ENCOUNTER — Other Ambulatory Visit: Payer: BLUE CROSS/BLUE SHIELD

## 2013-02-08 ENCOUNTER — Other Ambulatory Visit: Payer: Self-pay | Admitting: Specialist

## 2013-02-08 DIAGNOSIS — M5416 Radiculopathy, lumbar region: Secondary | ICD-10-CM

## 2013-02-10 ENCOUNTER — Ambulatory Visit: Payer: No Typology Code available for payment source | Attending: Specialist

## 2013-02-10 DIAGNOSIS — M47817 Spondylosis without myelopathy or radiculopathy, lumbosacral region: Secondary | ICD-10-CM | POA: Insufficient documentation

## 2013-02-10 DIAGNOSIS — M7130 Other bursal cyst, unspecified site: Secondary | ICD-10-CM | POA: Insufficient documentation

## 2013-02-10 DIAGNOSIS — M51379 Other intervertebral disc degeneration, lumbosacral region without mention of lumbar back pain or lower extremity pain: Secondary | ICD-10-CM | POA: Insufficient documentation

## 2013-02-10 DIAGNOSIS — M25559 Pain in unspecified hip: Secondary | ICD-10-CM | POA: Insufficient documentation

## 2013-02-10 DIAGNOSIS — M5126 Other intervertebral disc displacement, lumbar region: Secondary | ICD-10-CM | POA: Insufficient documentation

## 2013-02-17 HISTORY — PX: SPINE SURGERY: SHX786

## 2013-02-17 HISTORY — PX: BACK SURGERY: SHX140

## 2013-02-20 ENCOUNTER — Ambulatory Visit: Payer: No Typology Code available for payment source | Attending: Specialist

## 2013-02-20 ENCOUNTER — Other Ambulatory Visit: Payer: Self-pay

## 2013-02-20 DIAGNOSIS — Z01818 Encounter for other preprocedural examination: Secondary | ICD-10-CM | POA: Insufficient documentation

## 2013-02-20 DIAGNOSIS — M7130 Other bursal cyst, unspecified site: Secondary | ICD-10-CM

## 2013-02-20 LAB — ECG 12-LEAD
Atrial Rate: 64 {beats}/min
P Axis: 9 degrees
P-R Interval: 90 ms
Q-T Interval: 402 ms
QRS Duration: 80 ms
QTC Calculation (Bezet): 414 ms
R Axis: 19 degrees
T Axis: 27 degrees
Ventricular Rate: 64 {beats}/min

## 2013-02-20 LAB — BASIC METABOLIC PANEL
BUN: 13 mg/dL (ref 6.0–20.0)
CO2: 29 mEq/L (ref 21–30)
Calcium: 9.3 mg/dL (ref 8.5–10.5)
Chloride: 105 mEq/L (ref 96–109)
Creatinine: 0.9 mg/dL (ref 0.4–1.5)
Glucose: 76 mg/dL (ref 70–100)
Potassium: 4.8 mEq/L (ref 3.5–5.3)
Sodium: 142 mEq/L (ref 135–146)

## 2013-02-20 LAB — PT AND APTT
PT INR: 1 (ref 0.9–1.1)
PT: 13.5 (ref 12.6–15.0)
PTT: 30 (ref 23–37)

## 2013-02-20 LAB — CBC AND DIFFERENTIAL
Basophils Absolute Automated: 0.02 (ref 0.00–0.20)
Basophils Automated: 0 %
Eosinophils Absolute Automated: 0.05 (ref 0.00–0.70)
Eosinophils Automated: 1 %
Hematocrit: 40.7 % (ref 37.0–47.0)
Hgb: 13.6 g/dL (ref 12.0–16.0)
Immature Granulocytes Absolute: 0.01
Immature Granulocytes: 0 %
Lymphocytes Absolute Automated: 0.84 (ref 0.50–4.40)
Lymphocytes Automated: 21 %
MCH: 31.5 pg (ref 28.0–32.0)
MCHC: 33.4 g/dL (ref 32.0–36.0)
MCV: 94.2 fL (ref 80.0–100.0)
MPV: 10.4 fL (ref 9.4–12.3)
Monocytes Absolute Automated: 0.52 (ref 0.00–1.20)
Monocytes: 13 %
Neutrophils Absolute: 2.61 (ref 1.80–8.10)
Neutrophils: 65 %
Nucleated RBC: 0 (ref 0–1)
Platelets: 264 (ref 140–400)
RBC: 4.32 (ref 4.20–5.40)
RDW: 13 % (ref 12–15)
WBC: 4.05 (ref 3.50–10.80)

## 2013-02-20 LAB — TYPE AND SCREEN
AB Screen Gel: NEGATIVE
ABO Rh: A POS

## 2013-02-20 LAB — HEMOLYSIS INDEX: Hemolysis Index: 4 (ref 0–9)

## 2013-02-20 LAB — GFR: EGFR: >60

## 2013-02-21 ENCOUNTER — Encounter (INDEPENDENT_AMBULATORY_CARE_PROVIDER_SITE_OTHER): Payer: Self-pay

## 2013-02-21 NOTE — Pre-Procedure Instructions (Signed)
Labs 02/20/13 rev

## 2013-02-23 ENCOUNTER — Ambulatory Visit: Payer: BLUE CROSS/BLUE SHIELD

## 2013-02-23 NOTE — Pre-Procedure Instructions (Addendum)
Pt concerned re:  History of migraine headache with vomiting preop with surgery.  RN consulted with Gwen in Anesthesia regarding NPO.  Gwen suggested pt call surgeon to request arriving earlier to potentially receive IVF preop for hydration.  Message left on pt's work phone # (per pt's permission) regarding above and to call [phone] with questions  Pain fax sent 02/23/13 regarding chronic pain issues with back for approx. 6 years  preop testing done and rev. By [employer]

## 2013-02-26 NOTE — Pre-Procedure Instructions (Addendum)
Ekg 02-20-13 rev'd.  TXS physician order rev'd.

## 2013-02-27 NOTE — Pre-Procedure Instructions (Signed)
Audfrey will sent preop info requested to [facility]

## 2013-02-27 NOTE — Pre-Procedure Instructions (Signed)
Reviewed H/P from surgeon     Reviewed Negative MRSA/MSSA

## 2013-02-27 NOTE — Pre-Procedure Instructions (Signed)
[name] at Dr Bari Mantis office to request cxray results  She will call back [facility]

## 2013-02-28 ENCOUNTER — Ambulatory Visit: Payer: No Typology Code available for payment source | Admitting: Specialist

## 2013-02-28 ENCOUNTER — Ambulatory Visit: Payer: Self-pay

## 2013-02-28 ENCOUNTER — Other Ambulatory Visit: Payer: Self-pay

## 2013-02-28 ENCOUNTER — Ambulatory Visit: Payer: No Typology Code available for payment source | Admitting: Anesthesiology

## 2013-02-28 ENCOUNTER — Encounter
Admission: RE | Disposition: A | Discharge status: Home / Self Care | Payer: Self-pay | Source: Ambulatory Visit | Attending: Specialist

## 2013-02-28 ENCOUNTER — Ambulatory Visit: Payer: No Typology Code available for payment source

## 2013-02-28 ENCOUNTER — Ambulatory Visit
Admission: RE | Admit: 2013-02-28 | Discharge: 2013-03-01 | Disposition: A | Discharge status: Home / Self Care | Payer: No Typology Code available for payment source | Source: Ambulatory Visit | Attending: Specialist | Admitting: Specialist

## 2013-02-28 ENCOUNTER — Encounter: Payer: Self-pay | Admitting: Anesthesiology

## 2013-02-28 DIAGNOSIS — Z882 Allergy status to sulfonamides status: Secondary | ICD-10-CM | POA: Insufficient documentation

## 2013-02-28 DIAGNOSIS — L408 Other psoriasis: Secondary | ICD-10-CM | POA: Insufficient documentation

## 2013-02-28 DIAGNOSIS — G43909 Migraine, unspecified, not intractable, without status migrainosus: Secondary | ICD-10-CM | POA: Insufficient documentation

## 2013-02-28 DIAGNOSIS — K219 Gastro-esophageal reflux disease without esophagitis: Secondary | ICD-10-CM | POA: Insufficient documentation

## 2013-02-28 DIAGNOSIS — M48061 Spinal stenosis, lumbar region without neurogenic claudication: Secondary | ICD-10-CM | POA: Insufficient documentation

## 2013-02-28 DIAGNOSIS — M7130 Other bursal cyst, unspecified site: Secondary | ICD-10-CM | POA: Insufficient documentation

## 2013-02-28 HISTORY — PX: LAMINECTOMY, LUMBAR, REMOVAL HNP, LEVEL 1: SHX4443

## 2013-02-28 SURGERY — LAMINECTOMY, LUMBAR, REMOVAL HNP, LEVEL1
Anesthesia: Anesthesia General | Laterality: Right | Wound class: Clean

## 2013-02-28 MED ORDER — BACITRACIN 50000 UNITS IM SOLR
INTRAMUSCULAR | Status: DC | PRN
Start: 2013-02-28 — End: 2013-02-28
  Administered 2013-02-28: 50000 [IU]

## 2013-02-28 MED ORDER — LIDOCAINE HCL 2 % IJ SOLN
INTRAMUSCULAR | Status: DC | PRN
Start: 2013-02-28 — End: 2013-02-28
  Administered 2013-02-28: 60 mg

## 2013-02-28 MED ORDER — MEPERIDINE HCL 25 MG/ML IJ SOLN
25.0000 mg | INTRAMUSCULAR | Status: DC | PRN
Start: 2013-02-28 — End: 2013-02-28

## 2013-02-28 MED ORDER — SUMATRIPTAN SUCCINATE 50 MG PO TABS
100.0000 mg | ORAL_TABLET | Freq: Two times a day (BID) | ORAL | Status: DC | PRN
Start: 2013-02-28 — End: 2013-03-01
  Filled 2013-02-28 (×2): qty 2

## 2013-02-28 MED ORDER — DEXAMETHASONE SODIUM PHOSPHATE 20 MG/5ML IJ SOLN
INTRAMUSCULAR | Status: AC
Start: 2013-02-28 — End: ?
  Filled 2013-02-28: qty 5

## 2013-02-28 MED ORDER — NEOSTIGMINE METHYLSULFATE 1 MG/ML IJ SOLN
INTRAMUSCULAR | Status: DC | PRN
Start: 2013-02-28 — End: 2013-02-28
  Administered 2013-02-28: 3 mg via INTRAVENOUS

## 2013-02-28 MED ORDER — SCOPOLAMINE 1 MG/3DAYS TD PT72
MEDICATED_PATCH | TRANSDERMAL | Status: AC
Start: 2013-02-28 — End: ?
  Filled 2013-02-28: qty 1

## 2013-02-28 MED ORDER — HYDROCODONE-ACETAMINOPHEN 5-325 MG PO TABS
1.0000 | ORAL_TABLET | Freq: Once | ORAL | Status: DC | PRN
Start: 2013-02-28 — End: 2013-02-28

## 2013-02-28 MED ORDER — ONDANSETRON HCL 4 MG/2ML IJ SOLN
4.0000 mg | Freq: Three times a day (TID) | INTRAMUSCULAR | Status: DC | PRN
Start: 2013-02-28 — End: 2013-03-01
  Administered 2013-02-28 – 2013-03-01 (×2): 4 mg via INTRAVENOUS
  Filled 2013-02-28 (×2): qty 2

## 2013-02-28 MED ORDER — CYCLOBENZAPRINE HCL 10 MG PO TABS
5.0000 mg | ORAL_TABLET | Freq: Once | ORAL | Status: DC | PRN
Start: 2013-02-28 — End: 2013-02-28

## 2013-02-28 MED ORDER — GLYCOPYRROLATE 0.2 MG/ML IJ SOLN
INTRAMUSCULAR | Status: DC | PRN
Start: 2013-02-28 — End: 2013-02-28
  Administered 2013-02-28: 600 ug via INTRAVENOUS

## 2013-02-28 MED ORDER — GABAPENTIN 300 MG PO CAPS
ORAL_CAPSULE | ORAL | Status: AC
Start: 2013-02-28 — End: ?
  Filled 2013-02-28: qty 2

## 2013-02-28 MED ORDER — GABAPENTIN 300 MG PO CAPS
600.0000 mg | ORAL_CAPSULE | Freq: Two times a day (BID) | ORAL | Status: DC
Start: 2013-02-28 — End: 2013-03-01
  Filled 2013-02-28: qty 2

## 2013-02-28 MED ORDER — HYDROCODONE-ACETAMINOPHEN 10-325 MG PO TABS
ORAL_TABLET | ORAL | Status: AC
Start: 2013-02-28 — End: 2013-02-28
  Filled 2013-02-28: qty 1

## 2013-02-28 MED ORDER — CYCLOBENZAPRINE HCL 10 MG PO TABS
10.0000 mg | ORAL_TABLET | Freq: Three times a day (TID) | ORAL | Status: DC | PRN
Start: 2013-02-28 — End: 2013-03-01

## 2013-02-28 MED ORDER — GELATIN ABSORBABLE 100 EX MISC
CUTANEOUS | Status: DC | PRN
Start: 2013-02-28 — End: 2013-02-28
  Administered 2013-02-28: 1 via TOPICAL

## 2013-02-28 MED ORDER — ONDANSETRON HCL 4 MG/2ML IJ SOLN
4.0000 mg | Freq: Once | INTRAMUSCULAR | Status: DC | PRN
Start: 2013-02-28 — End: 2013-02-28

## 2013-02-28 MED ORDER — LACTATED RINGERS IV SOLN
INTRAVENOUS | Status: DC
Start: 2013-02-28 — End: 2013-02-28

## 2013-02-28 MED ORDER — HYDROMORPHONE HCL PF 1 MG/ML IJ SOLN
1.0000 mg | INTRAMUSCULAR | Status: DC | PRN
Start: 2013-02-28 — End: 2013-03-01
  Administered 2013-02-28 – 2013-03-01 (×2): 1 mg via INTRAVENOUS
  Filled 2013-02-28 (×2): qty 1

## 2013-02-28 MED ORDER — MIDAZOLAM HCL 2 MG/2ML IJ SOLN
INTRAMUSCULAR | Status: AC
Start: 2013-02-28 — End: ?
  Filled 2013-02-28: qty 2

## 2013-02-28 MED ORDER — FENTANYL CITRATE 0.05 MG/ML IJ SOLN
25.0000 ug | INTRAMUSCULAR | Status: AC | PRN
Start: 2013-02-28 — End: 2013-02-28
  Administered 2013-02-28 (×3): 25 ug via INTRAVENOUS

## 2013-02-28 MED ORDER — HYDROCODONE-ACETAMINOPHEN 5-325 MG PO TABS
ORAL_TABLET | ORAL | 0 refills | Status: DC
Start: 2013-02-28 — End: 2014-04-26
  Filled 2013-02-28: qty 30, 5d supply, fill #0

## 2013-02-28 MED ORDER — FENTANYL CITRATE 0.05 MG/ML IJ SOLN
INTRAMUSCULAR | Status: DC | PRN
Start: 2013-02-28 — End: 2013-02-28
  Administered 2013-02-28 (×2): 50 ug via INTRAVENOUS

## 2013-02-28 MED ORDER — ONDANSETRON HCL 4 MG/2ML IJ SOLN
INTRAMUSCULAR | Status: AC
Start: 2013-02-28 — End: ?
  Filled 2013-02-28: qty 2

## 2013-02-28 MED ORDER — ROCURONIUM BROMIDE 50 MG/5ML IV SOLN
INTRAVENOUS | Status: AC
Start: 2013-02-28 — End: ?
  Filled 2013-02-28: qty 5

## 2013-02-28 MED ORDER — ACETAMINOPHEN 325 MG PO TABS
650.0000 mg | ORAL_TABLET | ORAL | Status: DC | PRN
Start: 2013-02-28 — End: 2013-03-01

## 2013-02-28 MED ORDER — ONDANSETRON HCL 4 MG/2ML IJ SOLN
INTRAMUSCULAR | Status: DC | PRN
Start: 2013-02-28 — End: 2013-02-28
  Administered 2013-02-28: 4 mg via INTRAVENOUS

## 2013-02-28 MED ORDER — FAMOTIDINE 10 MG/ML IV SOLN (WRAP)
INTRAVENOUS | Status: DC | PRN
Start: 2013-02-28 — End: 2013-02-28
  Administered 2013-02-28: 20 mg via INTRAVENOUS

## 2013-02-28 MED ORDER — DEXAMETHASONE SODIUM PHOSPHATE 4 MG/ML IJ SOLN (WRAP)
INTRAMUSCULAR | Status: DC | PRN
Start: 2013-02-28 — End: 2013-02-28
  Administered 2013-02-28: 20 mg via INTRAVENOUS

## 2013-02-28 MED ORDER — FAMOTIDINE 20 MG PO TABS
20.0000 mg | ORAL_TABLET | Freq: Two times a day (BID) | ORAL | Status: DC
Start: 2013-02-28 — End: 2013-03-01
  Filled 2013-02-28: qty 1

## 2013-02-28 MED ORDER — GLYCOPYRROLATE 0.2 MG/ML IJ SOLN
INTRAMUSCULAR | Status: AC
Start: 2013-02-28 — End: ?
  Filled 2013-02-28: qty 4

## 2013-02-28 MED ORDER — PROPOFOL INFUSION 10 MG/ML
INTRAVENOUS | Status: DC | PRN
Start: 2013-02-28 — End: 2013-02-28
  Administered 2013-02-28: 120 mg via INTRAVENOUS

## 2013-02-28 MED ORDER — OXYCODONE HCL ER 10 MG PO T12A
10.0000 mg | EXTENDED_RELEASE_TABLET | Freq: Once | ORAL | Status: AC
Start: 2013-02-28 — End: 2013-02-28
  Administered 2013-02-28: 10 mg via ORAL

## 2013-02-28 MED ORDER — CEFAZOLIN SODIUM 1 G IJ SOLR
1.0000 g | Freq: Three times a day (TID) | INTRAMUSCULAR | Status: AC
Start: 2013-02-28 — End: 2013-03-01
  Administered 2013-02-28 – 2013-03-01 (×2): 1 g via INTRAVENOUS
  Filled 2013-02-28: qty 1000
  Filled 2013-02-28: qty 50
  Filled 2013-02-28: qty 1000
  Filled 2013-02-28: qty 50

## 2013-02-28 MED ORDER — VITAMINS/MINERALS PO TABS
1.0000 | ORAL_TABLET | Freq: Every day | ORAL | Status: DC
Start: 2013-02-28 — End: 2013-03-01
  Filled 2013-02-28: qty 1

## 2013-02-28 MED ORDER — ONDANSETRON HCL 4 MG PO TABS
ORAL_TABLET | ORAL | 1 refills | Status: DC
Start: 2013-02-28 — End: 2013-03-01
  Filled 2013-02-28: qty 9, 3d supply, fill #0

## 2013-02-28 MED ORDER — HYDROCODONE-ACETAMINOPHEN 5-325 MG PO TABS
2.0000 | ORAL_TABLET | ORAL | Status: DC | PRN
Start: 2013-02-28 — End: 2013-03-01
  Administered 2013-03-01: 1 via ORAL
  Filled 2013-02-28: qty 2
  Filled 2013-02-28: qty 1

## 2013-02-28 MED ORDER — CEFAZOLIN SODIUM 1 G IJ SOLR
1.0000 g | Freq: Once | INTRAMUSCULAR | Status: AC
Start: 2013-02-28 — End: 2013-02-28
  Administered 2013-02-28: 1 g via INTRAVENOUS

## 2013-02-28 MED ORDER — ACETAMINOPHEN 500 MG PO TABS
1000.0000 mg | ORAL_TABLET | Freq: Once | ORAL | Status: DC
Start: 2013-02-28 — End: 2013-02-28

## 2013-02-28 MED ORDER — LIDOCAINE HCL (PF) 2 % IJ SOLN
INTRAMUSCULAR | Status: AC
Start: 2013-02-28 — End: ?
  Filled 2013-02-28: qty 5

## 2013-02-28 MED ORDER — CYCLOBENZAPRINE HCL 10 MG PO TABS
ORAL_TABLET | ORAL | 1 refills | Status: DC
Start: 2013-02-28 — End: 2014-04-26
  Filled 2013-02-28: qty 60, 20d supply, fill #0

## 2013-02-28 MED ORDER — PHENYLEPHRINE 100 MCG/ML IV BOLUS (ANESTHESIA)
PREFILLED_SYRINGE | INTRAVENOUS | Status: AC
Start: 2013-02-28 — End: ?
  Filled 2013-02-28: qty 5

## 2013-02-28 MED ORDER — FENTANYL CITRATE 0.05 MG/ML IJ SOLN
INTRAMUSCULAR | Status: AC
Start: 2013-02-28 — End: ?
  Filled 2013-02-28: qty 2

## 2013-02-28 MED ORDER — CEFAZOLIN SODIUM 1 G IJ SOLR
1.0000 g | Freq: Three times a day (TID) | INTRAMUSCULAR | Status: DC
Start: 2013-02-28 — End: 2013-02-28

## 2013-02-28 MED ORDER — SODIUM CHLORIDE 0.9 % IV SOLN
INTRAVENOUS | Status: DC
Start: 2013-02-28 — End: 2013-03-01

## 2013-02-28 MED ORDER — HYDROMORPHONE HCL PF 1 MG/ML IJ SOLN
0.5000 mg | INTRAMUSCULAR | Status: DC | PRN
Start: 2013-02-28 — End: 2013-02-28

## 2013-02-28 MED ORDER — CEPASTAT 14.5 MG MT LOZG
1.0000 | LOZENGE | OROMUCOSAL | Status: DC | PRN
Start: 2013-02-28 — End: 2013-03-01

## 2013-02-28 MED ORDER — SODIUM CHLORIDE 0.9 % IR SOLN
Status: DC | PRN
Start: 2013-02-28 — End: 2013-02-28
  Administered 2013-02-28: 1000 mL

## 2013-02-28 MED ORDER — PHENYLEPHRINE 100 MCG/ML IV BOLUS (ANESTHESIA)
PREFILLED_SYRINGE | INTRAVENOUS | Status: DC | PRN
Start: 2013-02-28 — End: 2013-02-28
  Administered 2013-02-28 (×2): 50 ug via INTRAVENOUS

## 2013-02-28 MED ORDER — SODIUM CHLORIDE BACTERIOSTATIC 0.9 % IJ SOLN
INTRAMUSCULAR | Status: DC | PRN
Start: 2013-02-28 — End: 2013-02-28
  Administered 2013-02-28: 10 mL

## 2013-02-28 MED ORDER — GABAPENTIN 300 MG PO CAPS
600.0000 mg | ORAL_CAPSULE | Freq: Once | ORAL | Status: DC
Start: 2013-02-28 — End: 2013-02-28

## 2013-02-28 MED ORDER — CALCIUM CITRATE-VITAMIN D 315-250 MG-UNIT PO TABS
1.0000 | ORAL_TABLET | Freq: Every day | ORAL | Status: DC
Start: 2013-02-28 — End: 2013-03-01
  Filled 2013-02-28: qty 1

## 2013-02-28 MED ORDER — CYCLOBENZAPRINE HCL 10 MG PO TABS
ORAL_TABLET | ORAL | Status: AC
Start: 2013-02-28 — End: 2013-02-28
  Administered 2013-02-28: 10 mg via ORAL
  Filled 2013-02-28: qty 1

## 2013-02-28 MED ORDER — OXYCODONE HCL ER 10 MG PO T12A
EXTENDED_RELEASE_TABLET | ORAL | Status: AC
Start: 2013-02-28 — End: ?
  Filled 2013-02-28: qty 1

## 2013-02-28 MED ORDER — PROPOFOL 10 MG/ML IV EMUL
INTRAVENOUS | Status: AC
Start: 2013-02-28 — End: ?
  Filled 2013-02-28: qty 20

## 2013-02-28 MED ORDER — THROMBIN 5000 UNITS EX SOLR
CUTANEOUS | Status: DC | PRN
Start: 2013-02-28 — End: 2013-02-28
  Administered 2013-02-28: 5000 [IU] via TOPICAL

## 2013-02-28 MED ORDER — FAMOTIDINE 20 MG/2ML IV SOLN
INTRAVENOUS | Status: AC
Start: 2013-02-28 — End: ?
  Filled 2013-02-28: qty 2

## 2013-02-28 MED ORDER — FENTANYL CITRATE 0.05 MG/ML IJ SOLN
INTRAMUSCULAR | Status: AC
Start: 2013-02-28 — End: 2013-02-28
  Administered 2013-02-28: 25 ug via INTRAVENOUS
  Filled 2013-02-28: qty 2

## 2013-02-28 MED ORDER — ACETAMINOPHEN 500 MG PO TABS
ORAL_TABLET | ORAL | Status: AC
Start: 2013-02-28 — End: ?
  Filled 2013-02-28: qty 2

## 2013-02-28 MED ORDER — HYDROMORPHONE HCL PF 1 MG/ML IJ SOLN
INTRAMUSCULAR | Status: AC
Start: 2013-02-28 — End: 2013-02-28
  Administered 2013-02-28: 0.5 mg via INTRAVENOUS
  Filled 2013-02-28: qty 1

## 2013-02-28 MED ORDER — ROCURONIUM BROMIDE 50 MG/5ML IV SOLN
INTRAVENOUS | Status: DC | PRN
Start: 2013-02-28 — End: 2013-02-28
  Administered 2013-02-28: 40 mg via INTRAVENOUS

## 2013-02-28 MED ORDER — HYDROCODONE-ACETAMINOPHEN 5-325 MG PO TABS
ORAL_TABLET | ORAL | Status: AC
Start: 2013-02-28 — End: 2013-02-28
  Administered 2013-02-28: 2 via ORAL
  Filled 2013-02-28: qty 2

## 2013-02-28 MED ORDER — SENNOSIDES-DOCUSATE SODIUM 8.6-50 MG PO TABS
1.0000 | ORAL_TABLET | Freq: Two times a day (BID) | ORAL | Status: DC
Start: 2013-02-28 — End: 2013-03-01
  Filled 2013-02-28: qty 1

## 2013-02-28 SURGICAL SUPPLY — 47 items
BANDAGE ADH PLSTR POLYACRYLATE CVRL 2YD (Bandage) ×1
BANDAGE ADHESIVE L2 YD X W6 IN STRETCH (Bandage) ×1 IMPLANT
BANDAGE ADHESIVE L2 YD X W6 IN STRETCH NONWOVEN POROUS COVER-ROLL (Bandage) ×1 IMPLANT
BNDG CVRL ADH 2YDX6IN PLSTR POLYACRYLATE (Bandage) ×1
CLOSURE STERI-STRIP 1X5IN (Dressing) ×2 IMPLANT
CONTAINER SPECIMAN STERILE 5OZ (Procedure Accessories) ×2 IMPLANT
DRAIN 4 FREE FLOW CHANNEL RADIOPAQUE (Drain) IMPLANT
DRAIN 4 FREE FLOW CHANNEL RADIOPAQUE BARD L1/4 IN INCISION SILICONE (Drain) IMPLANT
DRAIN INCS SIL FULL FLUT FLT BARD .25IN (Drain)
DRAPE 3/4 SHEET FANFLD 52X76IN (Drape) ×4 IMPLANT
DRAPE LAPAROTOMY (Drape) ×2 IMPLANT
DRAPE MAG FM DVN 20X16IN LF STRL HNDFR (Drape) ×2
DRAPE MAGNETIC HAND FREE TRANSFER (Drape) ×1 IMPLANT
ELECTRODE ELECTROSRGCL BLADE STD L2.5IN MEGADYNE E-Z CLEAN PTFE MNPL (Blade) ×1 IMPLANT
ELECTRODE ELECTROSURGICAL BLADE STANDARD (Blade) ×1 IMPLANT
ELECTRODE ESURG PTFE BLDE STD MEGADYNE (Blade) ×2
EVACUATOR WOUND SILICONE 100CC (Drain) IMPLANT
GLOVE SURG BIOGEL INDIC SZ 8.5 (Glove) ×2 IMPLANT
GLOVE SURG BIOGEL ORTHO SZ8.5 (Glove) ×4 IMPLANT
GOWN STANDARD XLG W/ PAPER TWL (Gown) ×1 IMPLANT
GOWN STNDRD XLG (W/ PAPER TWL) (Gown) ×2 IMPLANT
GRAFT BIOLOGICAL SOFT TISSUE DURAFORM 3X3IN BOVINE MATRIX 801478 (Graft) ×1 IMPLANT
GRAFT DURAFORM 3X3IN (Graft) ×2 IMPLANT
NEEDLE REG BEVEL 19GX1.5IN (Needles) ×4 IMPLANT
PACK SPNE FFX (Pack) ×2 IMPLANT
POSITIONER HEAD SLOTTED ADLT (Positioning Supplies) ×2 IMPLANT
SLEEVE SEQUEN COMP KNEE REG (Procedure Accessories) ×2 IMPLANT
SOL NACL INJ 0.9% 30ML BACTER (IV Solutions) ×2 IMPLANT
SOLUTION IRR 0.9% NACL 1000ML LF STRL (Irrigation Solutions) ×2
SOLUTION IRRIGATION 0.9% SODIUM CHLORIDE (Irrigation Solutions) ×1 IMPLANT
SOLUTION IRRIGATION 0.9% SODIUM CHLORIDE 1000 ML PLASTIC POUR BOTTLE (Irrigation Solutions) ×1 IMPLANT
SOLUTION SRGPRP 74% ISPRP 0.7% IOD (Prep) ×4
SOLUTION SURGICAL PREP 26 ML DURAPREP (Prep) ×2 IMPLANT
SOLUTION SURGICAL PREP 26 ML DURAPREP 74% ISOPROPYL ALCOHOL 0.7% (Prep) ×2 IMPLANT
SUTURE ABS 0 OS-6 VCL 18IN CR BRD 3 STRN (Suture) ×2
SUTURE COATED VICRYL 0 OS-6 L18 IN (Suture) ×1 IMPLANT
SUTURE COATED VICRYL 0 OS-6 L18 IN CONTROL RELEASE BRAID 3 STRAND (Suture) ×1 IMPLANT
SUTURE MONOCRYL 4-0 PS2 27IN (Suture) ×2 IMPLANT
SUTURE NABSB 6-0 BV-1 PRLN 24IN 2 ARM (Suture)
SUTURE PROLENE BLUE 6-0 BV-1 L24 IN 2 (Suture) IMPLANT
SUTURE PROLENE BLUE 6-0 BV-1 L24 IN 2 ARM MONOFILAMENT NONABSORBABLE (Suture) IMPLANT
SUTURE VICRYL 3-0 SH 8X18IN (Suture) ×2 IMPLANT
TISSEEL 4mL ×4 IMPLANT
TOOL DISSECTING L9 CM MATCH HEAD FLUTE (Burr) ×1 IMPLANT
TOOL DISSECTING L9 CM MATCH HEAD FLUTE OD3 MM MIDAS REX LEGEND (Burr) ×1 IMPLANT
TOOL DSCT MTCH HD LGND 3MM 9CM (Burr) ×2
TOWEL STERILE REUSABLE 8PK (Procedure Accessories) ×2 IMPLANT

## 2013-02-28 NOTE — Anesthesia Postprocedure Evaluation (Signed)
Anesthesia Post Evaluation    Patient: [name]    Procedures performed: Procedure(s) with comments:  LAMINECTOMY, LUMBAR, REMOVAL HNP, LEVEL 1 - RIGHT L5-S1 LUMBAR FORAMINOTOMY W/EXCISION OF SYNOVIAL CYST    Anesthesia type: General ETT    Patient location:PACU    Last vitals:   Filed Vitals:    02/28/13 1530   BP: 130/76   Pulse: 78   Temp:    Resp: 14   SpO2: 100%       Post pain: patient has received pain meds in PACU     Mental Status:awake    Respiratory Function: tolerating nasal cannula    Cardiovascular: see PACU record for VS    Nausea/Vomiting: patient not complaining of nausea or vomiting    Hydration Status: adequate    Post assessment: no apparent anesthetic complications, no reportable events and no evidence of recall    Dr. Joselyn Glassman [phone]

## 2013-02-28 NOTE — Transfer of Care (Signed)
Anesthesia Transfer of Care Note    Patient: [name]    Procedures performed: Procedure(s) with comments:  LAMINECTOMY, LUMBAR, REMOVAL HNP, LEVEL 1 - RIGHT L5-S1 LUMBAR FORAMINOTOMY W/EXCISION OF SYNOVIAL CYST    Anesthesia type: General ETT    Patient location:Phase I PACU    Last vitals:   Filed Vitals:    02/28/13 1452   BP: 129/74   Pulse: 89   Temp: 96.9 F (36.1 C)   Resp: 12   SpO2: 100%       Post pain: Patient not complaining of pain, continue current therapy      Mental Status:awake    Respiratory Function: tolerating face mask    Cardiovascular: stable    Nausea/Vomiting: patient not complaining of nausea or vomiting    Hydration Status: adequate    Post assessment: no apparent anesthetic complications, no reportable events and no evidence of recall

## 2013-02-28 NOTE — Plan of Care (Signed)
Problem: Health Promotion  Goal: Risk control - tobacco abuse  Actions to eliminate or reduce tobacco use.   Outcome: Completed Date Met:  02/28/13  Never smoked

## 2013-02-28 NOTE — Brief Op Note (Signed)
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 Item Serial No. Manufacturer Lot No. LRB No. Used Action   GRAFT DURAFORM 3X3IN - [id number] Graft GRAFT DURAFORM 3X3IN  [employer]ODMAN [id number] Right 1 Implanted   TISSEEL 4mL     [id number] Right 1 Implanted   TISSEEL 4mL         [id number] Right 1 Implanted         Drains:       Specimens:        SPECIMENS (last 24 hours)      Pathology Specimens     Row Name 02/28/13 1300             Specimen Information    Specimen Testing Required Routine Pathology     Specimen ID  A     Specimen Description SYNOVIAL CYST         Findings:   See op note    Complications:   none      Signed by: Marnee Guarneri, MD                                                                              [facility]

## 2013-02-28 NOTE — Anesthesia Preprocedure Evaluation (Addendum)
Anesthesia Evaluation    AIRWAY    Mallampati: II    Neck ROM: full  Mouth Opening:full   CARDIOVASCULAR    cardiovascular exam normal, regular and normal       DENTAL           PULMONARY    pulmonary exam normal and clear to auscultation     OTHER FINDINGS    GERD (well controlled with meds, took dose today)  s/p thyroid lump removal    EKG 02/20/13: NSR    Labs 02/20/13:  Hct 40.7  Plt 264              Anesthesia Plan    ASA 2     general                     intravenous induction   Detailed anesthesia plan: general endotracheal  Monitors/Adjuncts: other    Post Op: other  Post op pain management: per surgeon    informed consent obtained    Plan discussed with CRNA.    ECG reviewed  pertinent labs reviewed

## 2013-02-28 NOTE — Plan of Care (Signed)
Problem: Day of Surgery- Lumbar Discectomy/Laminectomy  Goal: Patient/Patient Care Companion demonstrates understanding of disease process, treatment plan, medications, and discharge plan  Intervention: Patient received [facility] pre-op spine education in the form of Pre-op class, online video, back in action booklet, quiz.  Patient completed online video Back in Action and the quiz prior to surgery e-mail confirmation received  February 17, 2013

## 2013-02-28 NOTE — H&P (Signed)
I have reviewed the H&P, examined the patient and there are no changes.

## 2013-02-28 NOTE — Op Note (Signed)
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     TITLE OF PROCEDURE:  Right L5-S1 foraminotomy with excision of a right L5-S1 synovial cyst,  decompression of the right S1 nerve root.       SURGEON:    Dr. Francisco Capuchin.       FIRST ASSISTANT:    Mr. Raquel James.       ANESTHESIA:  General.     DESCRIPTION OF PROCEDURE:  With the patient in supine position, anesthesia and endotracheal intubation  were accomplished.  The patient was placed on the Wilson frame in the prone  position.  Lumbar region was prepped and draped in the usual aseptic  manner.  An incision was made at the L5-S1 level, extended through  subcutaneous tissue down to the fascia.  The fascia was opened paramedially  on the right side and subperiosteal dissection was performed to expose the  L5-S1 interspace.  The procedure was performed with the help of C-arm,  which was used to identify the level of surgery with a marker.  Then, a  small partial hemilaminectomy, medial facetectomy, foraminotomy were  performed to expose the S1 nerve root, which was found to be compressed,  By a lateral recess and foraminal stenosis as well as a right L5-S1 synovial  cyst which was removed in its totality.  During the exposure there was a  pin hole centrally at the L5-S1 level, probably secondary to an epidural  injection, which was leaking CSF.  It was closed with a figure-of-eight 6-0  Prolene suture.  Dura-Guard and fibrin glue were used for adequate hermetic  closure.  Profuse irrigation was used throughout the procedure.  Hemostasis  was accomplished and the wound was closed in layers, closing the fascia  with 0-Vicryl simple sutures, subcutaneous closure with 3-0 Vicryl inverted  simple suture, and skin edges  approximated with 2-0 nylon running locking  suture.  The sponge count, needle count reported correct by nursing staff  at the end of the procedure.  Estimated blood loss was minimal.  She  received preoperative antibiotic therapy and had compression stockings to  her legs.           D:  02/28/2013 14:43 PM by Dr. Beatrix Shipper. Francisco Capuchin, MD [phone])  T:  02/28/2013 20:45 PM by       Everlean Cherry: [id number]) (Doc ID: [id number])

## 2013-03-01 ENCOUNTER — Encounter: Payer: Self-pay | Admitting: Specialist

## 2013-03-01 NOTE — Progress Notes (Signed)
Pt stated her nausea subsided and she felt okay to be discharged. Prescriptions given to pt yesterday and was filled by her sister. Discharge instructions reviewed with and given to the pt. Dr. Bari Mantis discharge instructions reviewed with and given to the pt. Handouts for dressing change and constipation given to the pt. IV d/c'ed with tip intact. Dressed with gz and tape. Pt discharged in no acute distress. Pt accompanied by her sister. Pt sent with personal belongings.

## 2013-03-01 NOTE — Plan of Care (Signed)
Problem: Day 1 Post-op- Lumbar Discectomy/Laminectomy  Goal: Hemodynamic Stability  Outcome: Progressing  VSS, pt afebrile.  Goal: Stable Neurovascular Status  Outcome: Progressing  Denies N/T. SCDs on while in bed. Pedal pulses intact.  Goal: Free from Infection  Outcome: Progressing  Back dressing remains clean, dry, and intact. Pt afebrile.  Goal: Pain at adequate level as identified by patient  Outcome: Progressing  Pt reports 5-6/10 pain to her back, aggravated with movement. Medicated with 1 tab norco, pt resting after norco given. Pt stated relief. Pt denies need for more pain medication.  Goal: Patient will maintain normal GI status  Outcome: Progressing  Pt with constant nausea post-op. No emesis. Medicated with 4mg  zofran IVP. Pt still with mild nausea but able to rest. Pt states she usually has severe nausea on POD #1 and she is fine on POD #2. Pt cleared for discharge. Pt wishing to stay until nausea gets better prior to discharging. Offered to call Dr. Francisco Capuchin to hold discharge, pt refused. Pt wants to wait a while longer.  Goal: Mobility/activity is maintained at optimum level for patient  Outcome: Progressing  Pt ambulating in the room with slow steady gait. Denies dizziness while up. Instructed pt on proper technique for getting in and out of bed. Pt sat in the chair for meals.  Goal: Patient/Patient Care Companion demonstrates understanding of disease process, treatment plan, medications, and discharge plan  Outcome: Progressing  Pt care companion in the room. Pt aware of plan for the shift. Pt cleared for discharge. Awaiting pt's nausea to subside for discharge.

## 2013-03-01 NOTE — Plan of Care (Signed)
Problem: Day 1 Post-op- Lumbar Discectomy/Laminectomy  Goal: Hemodynamic Stability  Outcome: Progressing  VSS. Pt afebrile. Pt voiding in the bathroom without difficulty.   Goal: Stable Neurovascular Status  Outcome: Progressing  Pt denies N/T. SCDs on while in bed. Pedal pulses intact. Moves BUEs 5/5, BLEs, 4/5.  Goal: Free from Infection  Outcome: Progressing  VSS, afebrile. Back dressing remains clean, dry, and intact.   Goal: Pain at adequate level as identified by patient  Outcome: Progressing  Pt rating pain 5-6/10 to her back after movement. Medicated with 1 tab norco per pt's request. Pt rerating pain 4/10.  Goal: Patient will maintain normal GI status  Outcome: Progressing  Pt with intermittent nausea, no emesis. Pt unable to eat lunch. Medicated with 4mg  zofran.  Goal: Mobility/activity is maintained at optimum level for patient  Outcome: Progressing  Pt ambulating in the room with steady gait. Pt denies dizziness while up. Pt using proper technique to get in and out of bed.   Goal: Patient/Patient Care Companion demonstrates understanding of disease process, treatment plan, medications, and discharge plan  Outcome: Progressing  Pt care companion at the bedside. Pt aware of goals for today. Pt cleared for discharge. Discharge time pushed back as pt with moderate nausea at this time.

## 2013-03-01 NOTE — Discharge Instructions (Signed)
Back in Action Spine Surgery booklet to be provided and reviewed - reviewed with and given to the patient.    Spine Wound Care information sheet to be provided and reviewed - reviewed with and given to the patient.    Constipation Information sheet to be provided and reviewed   - reviewed with and given to the patient.    Dr. Bari Mantis Postoperative Instructions for Back Surgery to be provided - reviewed with and given to the patient.    Mixing alcohol and pain medications can cause dizziness and slow your breathing.    Don't drink alcoholic beverages while taking pain medications.         MEDICATION: CYCLOBENZAPRINE   Cyclobenzaprine (brand: Flexeril) is a sedative and muscle relaxant medication. It is intended to be used along with rest, heat or ice packs, and other measures to relieve acute muscle spasm.  DIRECTIONS FOR USE:  Cyclobenzaprine may be taken on an empty stomach or with food. Take the medicine at regular intervals. If the label says "every 8 hours", this means 3 times per day. Doses don't have to be exactly 8 hours apart, but you should take 3 doses a day. This medicine should not be taken daily for more than 3 weeks without consulting your doctor.  WHAT TO WATCH FOR:  POSSIBLE SIDE EFFECTS: Drowsiness, dizziness (Take it less often and contact your doctor if symptoms persist). Dry mouth (Chew gum or suck on hard candy). Constipation (Contact your doctor). Difficulty passing urine; seizure (Stop the medicine and contact your doctor).  MEDICAL CONDITIONS: Before starting this medicine, be sure your doctor knows if you have any of the following conditions:   Pregnancy or breastfeeding, sleep apnea   Glaucoma, prostate enlargement, seizure disorder, asthma, or lung disease  DRUG INTERACTIONS: Before starting this medicine, be sure your doctor knows if you are taking any of the following drugs:   MAO inhibitors within the last 14 days [Nardil (phenelzine), Parnate (tranylcypromine)];   Tricyclic  antidepressants [amitriptyline (Elavil), doxepin (Sinequan), imipramine (Tofranil), and others],   Phenothiazines: Thorazine, Haldol, Mellaril, Compazine, and others   Antihistamines Benadryl, Vistaril, Atarax and others; Bentyl, Donnatal, Librax, Pro-Banthine,   Cystospaz, Cogentin, Artane, Combivent, or Atrovent inhalers; anti-Parkinson drugs  WARNINGS:   Do not drive, ride a bicycle, or operate dangerous equipment while taking this medicine until you know how it will affect you.   May cause excess drowsiness when taken with alcohol, muscle relaxant, sedative, or pain medicine. Use with caution.   May cause thickened mucus and worsening of asthma, COPD, or emphysema. Use with caution.  [NOTE: This information topic may not include all directions, precautions, medical conditions, drug/food interactions and warnings for this drug. Check with your doctor, nurse, or pharmacist for any questions that you may have.]   [address], [address], [place], [place] [address]. All rights reserved. This information is not intended as a substitute for professional medical care. Always follow your healthcare professional's instructions.      Hydrocodone Bitartrate, Acetaminophen Oral tablet   Hydrocodone Bitartrate, Acetaminophen Oral tablet  What is this medicine?  ACETAMINOPHEN; HYDROCODONE (a set a MEE noe fen; hye droe KOE done) is a pain reliever. It is used to treat mild to moderate pain.  This medicine may be used for other purposes; ask your health care provider or pharmacist if you have questions.  What should I tell my health care provider before I take this medicine?  They need to know if you have  any of these conditions:  brain tumor  Crohn's disease, inflammatory bowel disease, or ulcerative colitis  drink more than 3 alcohol-containing drinks per day  drug abuse or addiction  head injury  heart or circulation problems  kidney disease or problems going to the bathroom  liver disease  lung  disease, asthma, or breathing problems  an unusual or allergic reaction to acetaminophen, hydrocodone, other opioid analgesics, other medicines, foods, dyes, or preservatives  pregnant or trying to get pregnant  breast-feeding  How should I use this medicine?  Take this medicine by mouth. Swallow it with a full glass of water. Follow the directions on the prescription label. If the medicine upsets your stomach, take the medicine with food or milk. Do not take more than you are told to take.   Talk to your pediatrician regarding the use of this medicine in children. This medicine is not approved for use in children.  Overdosage: If you think you have taken too much of this medicine contact a poison control center or emergency room at once.  NOTE: This medicine is only for you. Do not share this medicine with others.  What if I miss a dose?  If you miss a dose, take it as soon as you can. If it is almost time for your next dose, take only that dose. Do not take double or extra doses.  What may interact with this medicine?  alcohol  antihistamines  isoniazid  medicines for depression, anxiety, or psychotic disturbances  medicines for sleep  muscle relaxants  naltrexone  narcotic medicines (opiates) for pain  phenobarbital  ritonavir  tramadol  This list may not describe all possible interactions. Give your health care provider a list of all the medicines, herbs, non-prescription drugs, or dietary supplements you use. Also tell them if you smoke, drink alcohol, or use illegal drugs. Some items may interact with your medicine.  What should I watch for while using this medicine?  Tell your doctor or health care professional if your pain does not go away, if it gets worse, or if you have new or a different type of pain. You may develop tolerance to the medicine. Tolerance means that you will need a higher dose of the medicine for pain relief. Tolerance is normal and is expected if you take the medicine for a long time.  Do  not suddenly stop taking your medicine because you may develop a severe reaction. Your body becomes used to the medicine. This does NOT mean you are addicted. Addiction is a behavior related to getting and using a drug for a non-medical reason. If you have pain, you have a medical reason to take pain medicine. Your doctor will tell you how much medicine to take. If your doctor wants you to stop the medicine, the dose will be slowly lowered over time to avoid any side effects.  You may get drowsy or dizzy when you first start taking the medicine or change doses. Do not drive, use machinery, or do anything that may be dangerous until you know how the medicine affects you. Stand or sit up slowly.  There are different types of narcotic medicines (opiates) for pain. If you take more than one type at the same time, you may have more side effects. Give your health care provider a list of all medicines you use. Your doctor will tell you how much medicine to take. Do not take more medicine than directed. Call emergency for help if you have problems  breathing.  The medicine will cause constipation. Try to have a bowel movement at least every 2 to 3 days. If you do not have a bowel movement for 3 days, call your doctor or health care professional.  Too much acetaminophen can be very dangerous. Do not take Tylenol (acetaminophen) or medicines that contain acetaminophen with this medicine. Many non-prescription medicines contain acetaminophen. Always read the labels carefully.  What side effects may I notice from receiving this medicine?  Side effects that you should report to your doctor or health care professional as soon as possible:  allergic reactions like skin rash, itching or hives, swelling of the face, lips, or tongue  breathing problems  confusion  feeling faint or lightheaded, falls  stomach pain  yellowing of the eyes or skin  Side effects that usually do not require medical attention (report to your doctor or health  care professional if they continue or are bothersome):  nausea, vomiting  stomach upset  This list may not describe all possible side effects. Call your doctor for medical advice about side effects. You may report side effects to FDA at 1-800-FDA-1088.  Where should I keep my medicine?  Keep out of the reach of children. This medicine can be abused. Keep your medicine in a safe place to protect it from theft. Do not share this medicine with anyone. Selling or giving away this medicine is dangerous and against the law.  Store at room temperature between 15 and 30 degrees C (59 and 86 degrees F). Protect from light. Keep container tightly closed.  Throw away any unused medicine after the expiration date. Discard unused medicine and used packaging carefully. Pets and children can be harmed if they find used or lost packages.  NOTE: This sheet is a summary. It may not cover all possible information. If you have questions about this medicine, talk to your doctor, pharmacist, or health care provider.  NOTE:This sheet is a summary. It may not cover all possible information. If you have questions about this medicine, talk to your doctor, pharmacist, or health care provider. Copyright 2014 [employer]

## 2013-03-01 NOTE — Final Progress Note (DC Note for stay less than 48 (Signed)
ASSESSMENT: Patient is doing quite well postoperatively. Her preoperative symptoms have markedly subsided.     CONDITION: Condition at time of discharge is much improved.     FINAL DIAGNOSIS: Lumbar synovial cyst    PRESCRIPTIONS: Robaxin and Norco  DISPOSITION: will be reassessed in the office in one week.

## 2013-03-01 NOTE — Plan of Care (Signed)
Problem: Day of Surgery- Lumbar Discectomy/Laminectomy  Goal: Hemodynamic Stability  Outcome: Completed Date Met:  03/01/13  VSS, unable to void  Goal: Stable Neurovascular Status  Outcome: Completed Date Met:  03/01/13  No numbness or tingling, SCD's in place  Goal: Free from Infection  Outcome: Completed Date Met:  03/01/13  Afebrile, antibiotic given  Goal: Pain at adequate level as identified by patient  Outcome: Progressing  Pain tolerable with prn dilaudid, will attempt pills in am  Goal: Patient will maintain normal GI status  Outcome: Progressing  Pt nauseated, zofran given with good effect  Goal: Mobility/activity is maintained at optimum level for patient  Outcome: Progressing  Pt on bedrest, able to move all extremities  Goal: Patient/Patient Care Companion demonstrates understanding of disease process, treatment plan, medications, and discharge plan  Outcome: Progressing  Pt aware of plan of care and is compliant    Problem: Moderate/High Fall Risk Score >/=15  Goal: Patient will remain free of falls  Outcome: Progressing  Pt free from falls/injury, call bell within reach, pt encouraged to call for assistance    Comments:   Pt a+ox4, VSS, no c/o cp, sob, does have nausea, IVFL running, no acute events, will ambulate at 0600

## 2014-04-05 ENCOUNTER — Ambulatory Visit: Payer: No Typology Code available for payment source

## 2014-04-18 NOTE — Progress Notes (Signed)
[place] Nurse Navigator: Patient has completed online spine preoperative education with quiz

## 2014-04-23 ENCOUNTER — Other Ambulatory Visit: Payer: Self-pay | Admitting: Orthopaedic Surgery

## 2014-04-23 ENCOUNTER — Ambulatory Visit
Admission: RE | Admit: 2014-04-23 | Discharge: 2014-04-23 | Disposition: A | Discharge status: Home / Self Care | Payer: No Typology Code available for payment source | Source: Ambulatory Visit | Attending: Orthopaedic Surgery | Admitting: Orthopaedic Surgery

## 2014-04-23 ENCOUNTER — Ambulatory Visit (HOSPITAL_BASED_OUTPATIENT_CLINIC_OR_DEPARTMENT_OTHER)
Admission: RE | Admit: 2014-04-23 | Discharge: 2014-04-23 | Disposition: A | Discharge status: Home / Self Care | Payer: No Typology Code available for payment source | Source: Ambulatory Visit

## 2014-04-23 DIAGNOSIS — Z01818 Encounter for other preprocedural examination: Secondary | ICD-10-CM

## 2014-04-23 LAB — URINALYSIS
Bilirubin, UA: NEGATIVE
Blood, UA: NEGATIVE
Glucose, UA: NEGATIVE
Ketones UA: NEGATIVE
Leukocyte Esterase, UA: NEGATIVE
Nitrite, UA: NEGATIVE
Protein, UR: NEGATIVE
Specific Gravity UA: 1.017 (ref 1.001–1.035)
Urine pH: 6 (ref 5.0–8.0)
Urobilinogen, UA: 0.2 (ref 0.2–2.0)

## 2014-04-23 LAB — ECG 12-LEAD
Atrial Rate: 83 {beats}/min
P Axis: 23 degrees
P-R Interval: 114 ms
Q-T Interval: 372 ms
QRS Duration: 86 ms
QTC Calculation (Bezet): 437 ms
R Axis: 42 degrees
T Axis: 54 degrees
Ventricular Rate: 83 {beats}/min

## 2014-04-23 LAB — COMPREHENSIVE METABOLIC PANEL
ALT: 29 U/L (ref 0–55)
AST (SGOT): 29 U/L (ref 5–34)
Albumin/Globulin Ratio: 1.4 (ref 0.9–2.2)
Albumin: 4.1 g/dL (ref 3.5–5.0)
Alkaline Phosphatase: 74 U/L (ref 37–106)
BUN: 14 mg/dL (ref 7.0–19.0)
Bilirubin, Total: 0.5 mg/dL (ref 0.1–1.2)
CO2: 26 mEq/L (ref 21–30)
Calcium: 9.8 mg/dL (ref 8.5–10.5)
Chloride: 109 mEq/L (ref 100–111)
Creatinine: 0.9 mg/dL (ref 0.4–1.5)
Globulin: 3 g/dL (ref 2.0–3.7)
Glucose: 88 mg/dL (ref 70–100)
Potassium: 5.2 mEq/L (ref 3.5–5.3)
Protein, Total: 7.1 g/dL (ref 6.0–8.3)
Sodium: 144 mEq/L (ref 135–146)

## 2014-04-23 LAB — CBC
Hematocrit: 42.9 % (ref 37.0–47.0)
Hgb: 13.7 g/dL (ref 12.0–16.0)
MCH: 31.4 pg (ref 28.0–32.0)
MCHC: 31.9 g/dL — ABNORMAL LOW (ref 32.0–36.0)
MCV: 98.4 fL (ref 80.0–100.0)
MPV: 10.2 fL (ref 9.4–12.3)
Nucleated RBC: 0 /100 WBC (ref 0–1)
Platelets: 313 10*3/uL (ref 140–400)
RBC: 4.36 10*6/uL (ref 4.20–5.40)
RDW: 13 % (ref 12–15)
WBC: 5.88 10*3/uL (ref 3.50–10.80)

## 2014-04-23 LAB — PT AND APTT
PT INR: 1 (ref 0.9–1.1)
PT: 13.6 s (ref 12.6–15.0)
PTT: 28 s (ref 23–37)

## 2014-04-23 LAB — GFR: EGFR: >60

## 2014-04-23 LAB — HEMOLYSIS INDEX: Hemolysis Index: 8 (ref 0–18)

## 2014-04-23 NOTE — Pre-Procedure Instructions (Signed)
Faxed EKG to Dr. Georgette Shell [phone].

## 2014-04-26 ENCOUNTER — Ambulatory Visit: Payer: No Typology Code available for payment source | Attending: Orthopaedic Surgery

## 2014-04-26 NOTE — Pre-Procedure Instructions (Signed)
Faxed Dr. Carolyne Littles office [phone]; Fax: [phone], for medical clearance, H&P, surgical consent. SDA form ready for T&C.Patient watched back in action video and did quiz.  Emailed patient surgical brochure, hibiclens instructions and spine info with contact number to [email or web address].

## 2014-05-13 NOTE — Brief Op Note (Signed)
BRIEF OP NOTE           Primary Surgeon:  Georgette Shell        Assistant:  Peggye Ley, MD     Technical Procedure:  Revision Lumbar Bilateral Laminectomy Fusion Instrumentation                                                  Bmp-2,                                                 Local  Autograft, InQu, OnQ pump   Levels:   L4-5 and L5-S1           Post-Operative Diagnosis:  HNP/Stenosis/ spondylolisthesis        Findings:  HNP/Stenosis        Estimated Blood Loss:  150        Specimens:  none            [name] M.D

## 2014-05-14 ENCOUNTER — Inpatient Hospital Stay: Payer: No Typology Code available for payment source

## 2014-05-14 ENCOUNTER — Inpatient Hospital Stay: Payer: No Typology Code available for payment source | Admitting: Orthopaedic Surgery

## 2014-05-14 ENCOUNTER — Encounter
Admission: RE | Disposition: A | Discharge status: Home Health | Payer: Self-pay | Source: Ambulatory Visit | Attending: Orthopaedic Surgery

## 2014-05-14 ENCOUNTER — Inpatient Hospital Stay: Payer: No Typology Code available for payment source | Admitting: Physician Assistant

## 2014-05-14 ENCOUNTER — Inpatient Hospital Stay
Admission: RE | Admit: 2014-05-14 | Discharge: 2014-05-16 | DRG: 460 | Disposition: A | Discharge status: Home Health | Payer: No Typology Code available for payment source | Source: Ambulatory Visit | Attending: Orthopaedic Surgery | Admitting: Orthopaedic Surgery

## 2014-05-14 DIAGNOSIS — M48061 Spinal stenosis, lumbar region without neurogenic claudication: Secondary | ICD-10-CM | POA: Diagnosis present

## 2014-05-14 DIAGNOSIS — M4317 Spondylolisthesis, lumbosacral region: Secondary | ICD-10-CM | POA: Diagnosis present

## 2014-05-14 DIAGNOSIS — R110 Nausea: Secondary | ICD-10-CM | POA: Diagnosis not present

## 2014-05-14 DIAGNOSIS — M4316 Spondylolisthesis, lumbar region: Principal | ICD-10-CM | POA: Diagnosis present

## 2014-05-14 DIAGNOSIS — M47896 Other spondylosis, lumbar region: Secondary | ICD-10-CM | POA: Diagnosis present

## 2014-05-14 DIAGNOSIS — M47897 Other spondylosis, lumbosacral region: Secondary | ICD-10-CM | POA: Diagnosis present

## 2014-05-14 DIAGNOSIS — K219 Gastro-esophageal reflux disease without esophagitis: Secondary | ICD-10-CM | POA: Diagnosis present

## 2014-05-14 HISTORY — DX: Anemia, unspecified: D64.9

## 2014-05-14 HISTORY — PX: LAMINECTOMY, POSTERIOR LUMBAR, DECOMP, FUSION, LEVEL 2: SHX4453

## 2014-05-14 LAB — TYPE AND SCREEN
AB Screen Gel: NEGATIVE
ABO Rh: A POS

## 2014-05-14 SURGERY — LAMINECTOMY, POSTERIOR LUMBAR, DECOMP, FUSION, LEVEL 2
Anesthesia: Anesthesia General | Site: Spine Lumbar | Wound class: Clean

## 2014-05-14 MED ORDER — NEOSTIGMINE METHYLSULFATE 1 MG/ML IJ SOLN
INTRAMUSCULAR | Status: DC | PRN
Start: 2014-05-14 — End: 2014-05-14
  Administered 2014-05-14: 3 mg via INTRAVENOUS

## 2014-05-14 MED ORDER — PREGABALIN 25 MG PO CAPS
ORAL_CAPSULE | ORAL | Status: AC
Start: 2014-05-14 — End: ?
  Filled 2014-05-14: qty 2

## 2014-05-14 MED ORDER — LACTATED RINGERS IV SOLN
75.0000 mL/h | INTRAVENOUS | Status: DC
Start: 2014-05-14 — End: 2014-05-14
  Administered 2014-05-14: 1000 mL/h via INTRAVENOUS

## 2014-05-14 MED ORDER — LIDOCAINE HCL 2 % IJ SOLN
INTRAMUSCULAR | Status: AC
Start: 2014-05-14 — End: ?
  Filled 2014-05-14: qty 20

## 2014-05-14 MED ORDER — HYDROMORPHONE HCL 2 MG/ML IJ SOLN
INTRAMUSCULAR | Status: AC
Start: 2014-05-14 — End: ?
  Filled 2014-05-14: qty 1

## 2014-05-14 MED ORDER — OXYCODONE HCL 5 MG PO TABS
10.0000 mg | ORAL_TABLET | ORAL | Status: DC | PRN
Start: 2014-05-14 — End: 2014-05-16
  Administered 2014-05-15 – 2014-05-16 (×4): 10 mg via ORAL
  Filled 2014-05-14 (×4): qty 2

## 2014-05-14 MED ORDER — ONDANSETRON HCL 4 MG/2ML IJ SOLN
INTRAMUSCULAR | Status: AC
Start: 2014-05-14 — End: ?
  Filled 2014-05-14: qty 2

## 2014-05-14 MED ORDER — HYDROMORPHONE HCL 1 MG/ML IJ SOLN
INTRAMUSCULAR | Status: DC | PRN
Start: 2014-05-14 — End: 2014-05-14
  Administered 2014-05-14 (×3): .2 mg via INTRAVENOUS

## 2014-05-14 MED ORDER — OXYCODONE HCL ER 10 MG PO T12A
10.0000 mg | EXTENDED_RELEASE_TABLET | Freq: Two times a day (BID) | ORAL | Status: DC
Start: 2014-05-14 — End: 2014-05-14
  Administered 2014-05-14: 10 mg via ORAL

## 2014-05-14 MED ORDER — OXYCODONE HCL ER 10 MG PO T12A
EXTENDED_RELEASE_TABLET | ORAL | Status: AC
Start: 2014-05-14 — End: ?
  Filled 2014-05-14: qty 1

## 2014-05-14 MED ORDER — ROCURONIUM BROMIDE 50 MG/5ML IV SOLN
INTRAVENOUS | Status: AC
Start: 2014-05-14 — End: ?
  Filled 2014-05-14: qty 5

## 2014-05-14 MED ORDER — SODIUM CHLORIDE 0.9 % IV SOLN
INTRAVENOUS | Status: DC
Start: 2014-05-14 — End: 2014-05-14

## 2014-05-14 MED ORDER — FENTANYL CITRATE 0.05 MG/ML IJ SOLN
INTRAMUSCULAR | Status: AC
Start: 2014-05-14 — End: 2014-05-14
  Administered 2014-05-14: 25 ug via INTRAVENOUS
  Filled 2014-05-14: qty 2

## 2014-05-14 MED ORDER — GELATIN ABSORBABLE 12-7 MM EX MISC
CUTANEOUS | Status: DC | PRN
Start: 2014-05-14 — End: 2014-05-14
  Administered 2014-05-14: 2 via TOPICAL

## 2014-05-14 MED ORDER — MIDAZOLAM HCL 2 MG/2ML IJ SOLN
INTRAMUSCULAR | Status: AC
Start: 2014-05-14 — End: ?
  Filled 2014-05-14: qty 2

## 2014-05-14 MED ORDER — ONDANSETRON HCL 4 MG/2ML IJ SOLN
4.0000 mg | Freq: Three times a day (TID) | INTRAMUSCULAR | Status: DC | PRN
Start: 2014-05-14 — End: 2014-05-16
  Administered 2014-05-15: 4 mg via INTRAVENOUS
  Filled 2014-05-14: qty 2

## 2014-05-14 MED ORDER — SODIUM CHLORIDE 0.9 % IV MBP
1.0000 g | INTRAVENOUS | Status: AC
Start: 2014-05-14 — End: 2014-05-14
  Administered 2014-05-14 (×2): 1 g via INTRAVENOUS

## 2014-05-14 MED ORDER — OXYCODONE HCL 5 MG PO TABS
5.0000 mg | ORAL_TABLET | ORAL | Status: DC | PRN
Start: 2014-05-14 — End: 2014-05-14

## 2014-05-14 MED ORDER — HYDROMORPHONE HCL 1 MG/ML IJ SOLN
INTRAMUSCULAR | Status: AC
Start: 2014-05-14 — End: 2014-05-15
  Filled 2014-05-14: qty 1

## 2014-05-14 MED ORDER — HYDROMORPHONE PCA 0.2 MG/ML BOLUS FROM PCA
0.5000 mg | INTRAVENOUS | Status: DC | PRN
Start: 2014-05-14 — End: 2014-05-15

## 2014-05-14 MED ORDER — LACTATED RINGERS IR SOLN
Status: DC | PRN
Start: 2014-05-14 — End: 2014-05-14
  Administered 2014-05-14: 3000 mL

## 2014-05-14 MED ORDER — FENTANYL CITRATE 0.05 MG/ML IJ SOLN
25.0000 ug | INTRAMUSCULAR | Status: AC | PRN
Start: 2014-05-14 — End: 2014-05-14
  Administered 2014-05-14 (×3): 25 ug via INTRAVENOUS

## 2014-05-14 MED ORDER — CEPASTAT 14.5 MG MT LOZG
1.0000 | LOZENGE | OROMUCOSAL | Status: DC | PRN
Start: 2014-05-14 — End: 2014-05-16
  Administered 2014-05-15: 1 via BUCCAL
  Filled 2014-05-14: qty 1

## 2014-05-14 MED ORDER — CEFAZOLIN SODIUM 1 G IJ SOLR
1.0000 g | Freq: Once | INTRAMUSCULAR | Status: DC
Start: 2014-05-14 — End: 2014-05-14

## 2014-05-14 MED ORDER — PROPOFOL 10 MG/ML IV EMUL
INTRAVENOUS | Status: AC
Start: 2014-05-14 — End: ?
  Filled 2014-05-14: qty 20

## 2014-05-14 MED ORDER — DOCUSATE SODIUM 100 MG PO CAPS
100.0000 mg | ORAL_CAPSULE | Freq: Two times a day (BID) | ORAL | Status: DC
Start: 2014-05-14 — End: 2014-05-16
  Administered 2014-05-14 – 2014-05-16 (×4): 100 mg via ORAL
  Filled 2014-05-14 (×4): qty 1

## 2014-05-14 MED ORDER — SODIUM CHLORIDE 0.9 % IR SOLN
Status: DC | PRN
Start: 2014-05-14 — End: 2014-05-14
  Administered 2014-05-14: 1000 mL

## 2014-05-14 MED ORDER — BUPIVACAINE HCL 0.5 % IJ SOLN
INTRAMUSCULAR | Status: DC | PRN
Start: 2014-05-14 — End: 2014-05-14
  Administered 2014-05-14: 10 mL

## 2014-05-14 MED ORDER — OXYCODONE HCL ER 10 MG PO T12A
10.0000 mg | EXTENDED_RELEASE_TABLET | Freq: Two times a day (BID) | ORAL | Status: DC
Start: 2014-05-14 — End: 2014-05-16
  Administered 2014-05-15 (×2): 10 mg via ORAL
  Filled 2014-05-14 (×3): qty 1

## 2014-05-14 MED ORDER — GELATIN ABSORBABLE 100 EX MISC
CUTANEOUS | Status: AC
Start: 2014-05-14 — End: ?
  Filled 2014-05-14: qty 1

## 2014-05-14 MED ORDER — HYDROMORPHONE PCA 0.2 MG/ML 100 ML (OUTSOURCED)
INTRAVENOUS | Status: DC
Start: 2014-05-14 — End: 2014-05-15

## 2014-05-14 MED ORDER — SCOPOLAMINE 1 MG/3DAYS TD PT72
MEDICATED_PATCH | TRANSDERMAL | Status: AC
Start: 2014-05-14 — End: ?
  Filled 2014-05-14: qty 1

## 2014-05-14 MED ORDER — DEXAMETHASONE SODIUM PHOSPHATE 4 MG/ML IJ SOLN (WRAP)
INTRAMUSCULAR | Status: DC | PRN
Start: 2014-05-14 — End: 2014-05-14
  Administered 2014-05-14: 6 mg via INTRAVENOUS

## 2014-05-14 MED ORDER — ONDANSETRON 4 MG PO TBDP
4.0000 mg | ORAL_TABLET | Freq: Three times a day (TID) | ORAL | Status: DC | PRN
Start: 2014-05-14 — End: 2014-05-16

## 2014-05-14 MED ORDER — LIDOCAINE HCL 2 % IJ SOLN
INTRAMUSCULAR | Status: DC | PRN
Start: 2014-05-14 — End: 2014-05-14

## 2014-05-14 MED ORDER — SCOPOLAMINE 1 MG/3DAYS TD PT72
1.0000 | MEDICATED_PATCH | Freq: Once | TRANSDERMAL | Status: DC
Start: 2014-05-14 — End: 2014-05-14
  Administered 2014-05-14: 1 via TRANSDERMAL

## 2014-05-14 MED ORDER — GELATIN ABSORBABLE 12-7 MM EX MISC
CUTANEOUS | Status: AC
Start: 2014-05-14 — End: ?
  Filled 2014-05-14: qty 2

## 2014-05-14 MED ORDER — PROPOFOL INFUSION 10 MG/ML
INTRAVENOUS | Status: DC | PRN
Start: 2014-05-14 — End: 2014-05-14
  Administered 2014-05-14: 30 mg via INTRAVENOUS
  Administered 2014-05-14: 140 mg via INTRAVENOUS

## 2014-05-14 MED ORDER — HYDROMORPHONE PCA 0.2 MG/ML 100 ML (OUTSOURCED)
INTRAVENOUS | Status: AC
Start: 2014-05-14 — End: 2014-05-14
  Filled 2014-05-14: qty 100

## 2014-05-14 MED ORDER — GLYCOPYRROLATE 1 MG/5ML IJ SOLN
INTRAMUSCULAR | Status: AC
Start: 2014-05-14 — End: ?
  Filled 2014-05-14: qty 5

## 2014-05-14 MED ORDER — EPHEDRINE SULFATE 50 MG/ML IJ SOLN
INTRAMUSCULAR | Status: DC | PRN
Start: 2014-05-14 — End: 2014-05-14
  Administered 2014-05-14: 12:00:00 5 mg via INTRAVENOUS
  Administered 2014-05-14: 11:00:00 10 mg via INTRAVENOUS
  Administered 2014-05-14: 11:00:00 5 mg via INTRAVENOUS

## 2014-05-14 MED ORDER — BUPIVACAINE HCL (PF) 0.5 % IJ SOLN
INTRAMUSCULAR | Status: AC
Start: 2014-05-14 — End: ?
  Filled 2014-05-14: qty 30

## 2014-05-14 MED ORDER — ONDANSETRON HCL 4 MG/2ML IJ SOLN
4.0000 mg | Freq: Four times a day (QID) | INTRAMUSCULAR | Status: DC | PRN
Start: 2014-05-14 — End: 2014-05-15

## 2014-05-14 MED ORDER — LIDOCAINE HCL 2 % IJ SOLN
INTRAMUSCULAR | Status: DC | PRN
Start: 2014-05-14 — End: 2014-05-14
  Administered 2014-05-14: 40 mg

## 2014-05-14 MED ORDER — PROMETHAZINE HCL 25 MG/ML IJ SOLN
6.2500 mg | INTRAMUSCULAR | Status: DC | PRN
Start: 2014-05-14 — End: 2014-05-14

## 2014-05-14 MED ORDER — OXYCODONE HCL ER 10 MG PO T12A
10.0000 mg | EXTENDED_RELEASE_TABLET | Freq: Two times a day (BID) | ORAL | Status: DC
Start: 2014-05-14 — End: 2014-05-14

## 2014-05-14 MED ORDER — ACETAMINOPHEN 325 MG PO TABS
325.0000 mg | ORAL_TABLET | ORAL | Status: DC | PRN
Start: 2014-05-14 — End: 2014-05-16
  Administered 2014-05-16: 325 mg via ORAL
  Filled 2014-05-14: qty 1

## 2014-05-14 MED ORDER — PROMETHAZINE HCL 25 MG/ML IJ SOLN
6.2500 mg | INTRAMUSCULAR | Status: DC | PRN
Start: 2014-05-14 — End: 2014-05-15

## 2014-05-14 MED ORDER — CEFAZOLIN SODIUM 1 G IJ SOLR
INTRAMUSCULAR | Status: AC
Start: 2014-05-14 — End: ?
  Filled 2014-05-14: qty 1000

## 2014-05-14 MED ORDER — GELATIN ABSORBABLE 12-7 MM EX MISC
CUTANEOUS | Status: AC
Start: 2014-05-14 — End: ?
  Filled 2014-05-14: qty 1

## 2014-05-14 MED ORDER — ONDANSETRON HCL 4 MG/2ML IJ SOLN
4.0000 mg | INTRAMUSCULAR | Status: DC | PRN
Start: 2014-05-14 — End: 2014-05-14

## 2014-05-14 MED ORDER — SODIUM CHLORIDE 0.9 % IV MBP
1.0000 g | Freq: Three times a day (TID) | INTRAVENOUS | Status: AC
Start: 2014-05-14 — End: 2014-05-15
  Administered 2014-05-14 – 2014-05-15 (×3): 1 g via INTRAVENOUS
  Filled 2014-05-14 (×3): qty 1000

## 2014-05-14 MED ORDER — FENTANYL CITRATE 0.05 MG/ML IJ SOLN
INTRAMUSCULAR | Status: DC | PRN
Start: 2014-05-14 — End: 2014-05-14
  Administered 2014-05-14 (×2): 100 ug via INTRAVENOUS
  Administered 2014-05-14: 50 ug via INTRAVENOUS

## 2014-05-14 MED ORDER — MIDAZOLAM HCL 2 MG/2ML IJ SOLN
INTRAMUSCULAR | Status: DC | PRN
Start: 2014-05-14 — End: 2014-05-14
  Administered 2014-05-14: 2 mg via INTRAVENOUS

## 2014-05-14 MED ORDER — VANCOMYCIN HCL 500 MG IV SOLR
INTRAVENOUS | Status: DC | PRN
Start: 2014-05-14 — End: 2014-05-14
  Administered 2014-05-14: 500 mg

## 2014-05-14 MED ORDER — PREGABALIN 25 MG PO CAPS
50.0000 mg | ORAL_CAPSULE | Freq: Once | ORAL | Status: AC
Start: 2014-05-14 — End: 2014-05-14
  Administered 2014-05-14: 50 mg via ORAL

## 2014-05-14 MED ORDER — DIPHENHYDRAMINE HCL 50 MG/ML IJ SOLN
12.5000 mg | INTRAMUSCULAR | Status: DC | PRN
Start: 2014-05-14 — End: 2014-05-14

## 2014-05-14 MED ORDER — BACITRACIN 50000 UNITS IM SOLR
INTRAMUSCULAR | Status: DC | PRN
Start: 2014-05-14 — End: 2014-05-14
  Administered 2014-05-14: 50000 [IU]

## 2014-05-14 MED ORDER — LACTATED RINGERS IV SOLN
INTRAVENOUS | Status: DC
Start: 2014-05-14 — End: 2014-05-14

## 2014-05-14 MED ORDER — VANCOMYCIN HCL 500 MG IV SOLR
INTRAVENOUS | Status: AC
Start: 2014-05-14 — End: ?
  Filled 2014-05-14: qty 500

## 2014-05-14 MED ORDER — NALOXONE HCL 0.4 MG/ML IJ SOLN
0.1000 mg | INTRAMUSCULAR | Status: DC | PRN
Start: 2014-05-14 — End: 2014-05-15

## 2014-05-14 MED ORDER — ROCURONIUM BROMIDE 50 MG/5ML IV SOLN
INTRAVENOUS | Status: DC | PRN
Start: 2014-05-14 — End: 2014-05-14
  Administered 2014-05-14: 15 mg via INTRAVENOUS
  Administered 2014-05-14: 10 mg via INTRAVENOUS
  Administered 2014-05-14: 35 mg via INTRAVENOUS

## 2014-05-14 MED ORDER — HYDROMORPHONE HCL 1 MG/ML IJ SOLN
0.5000 mg | INTRAMUSCULAR | Status: DC | PRN
Start: 2014-05-14 — End: 2014-05-14
  Administered 2014-05-14 (×2): 0.5 mg via INTRAVENOUS

## 2014-05-14 MED ORDER — METHOCARBAMOL 500 MG PO TABS
750.0000 mg | ORAL_TABLET | Freq: Three times a day (TID) | ORAL | Status: DC | PRN
Start: 2014-05-14 — End: 2014-05-16
  Administered 2014-05-15 – 2014-05-16 (×3): 750 mg via ORAL
  Filled 2014-05-14 (×3): qty 2

## 2014-05-14 MED ORDER — SUMATRIPTAN SUCCINATE 50 MG PO TABS
100.0000 mg | ORAL_TABLET | ORAL | Status: DC | PRN
Start: 2014-05-14 — End: 2014-05-16

## 2014-05-14 MED ORDER — THROMBIN 5000 UNITS EX SOLR
CUTANEOUS | Status: DC | PRN
Start: 2014-05-14 — End: 2014-05-14
  Administered 2014-05-14: 10000 [IU] via TOPICAL

## 2014-05-14 MED ORDER — ON-Q PUMP SINGLE FLOW
Status: DC | PRN
Start: 2014-05-14 — End: 2014-05-14

## 2014-05-14 MED ORDER — FENTANYL CITRATE 0.05 MG/ML IJ SOLN
INTRAMUSCULAR | Status: AC
Start: 2014-05-14 — End: ?
  Filled 2014-05-14: qty 5

## 2014-05-14 MED ORDER — EPHEDRINE SULFATE 50 MG/ML IJ SOLN
INTRAMUSCULAR | Status: AC
Start: 2014-05-14 — End: ?
  Filled 2014-05-14: qty 1

## 2014-05-14 MED ORDER — THROMBIN 5000 UNITS EX SOLR
CUTANEOUS | Status: AC
Start: 2014-05-14 — End: ?
  Filled 2014-05-14: qty 10000

## 2014-05-14 MED ORDER — BACITRACIN 50000 UNITS IM SOLR
INTRAMUSCULAR | Status: AC
Start: 2014-05-14 — End: ?
  Filled 2014-05-14: qty 50000

## 2014-05-14 MED ORDER — KCL IN DEXTROSE-NACL 20-5-0.45 MEQ/L-%-% IV SOLN
INTRAVENOUS | Status: DC
Start: 2014-05-14 — End: 2014-05-16

## 2014-05-14 MED ORDER — MEPERIDINE HCL 25 MG/ML IJ SOLN
12.5000 mg | INTRAMUSCULAR | Status: DC | PRN
Start: 2014-05-14 — End: 2014-05-14

## 2014-05-14 MED ORDER — GLYCOPYRROLATE 0.2 MG/ML IJ SOLN
INTRAMUSCULAR | Status: DC | PRN
Start: 2014-05-14 — End: 2014-05-14
  Administered 2014-05-14: .6 mg via INTRAVENOUS

## 2014-05-14 SURGICAL SUPPLY — 111 items
ADHESIVE SKIN SURGISEAL .35ML (Suture) ×2 IMPLANT
APPLCATOR CHLORAPREP 26ML (Prep) ×2 IMPLANT
BANDAGE ADH PLSTR POLYACRYLATE CVRL 2YD (Bandage) ×1
BANDAGE ADHESIVE L2 YD X W6 IN STRETCH (Bandage) ×1 IMPLANT
BANDAGE ADHESIVE L2 YD X W6 IN STRETCH NONWOVEN POROUS COVER-ROLL (Bandage) ×1 IMPLANT
BANDAGE STERI-STRIP 0.5X4IN (Dressing) ×2
BLADE BOVIE ELCTRD (Cautery) ×2 IMPLANT
BUR DENTAL L54.5 MM ROUND 12 FLUTE OD4.5 MM BUSA STAINLESS STEEL (Burr) ×1 IMPLANT
BUR RND 4.5X55MM 12 BLD SS (Burr) ×1
BURR DNTL SS RND 4.5MM 54.5MM 12 FLUT (Burr) ×1 IMPLANT
CAP LOCKING CREO (Cap) ×12 IMPLANT
CEMENT MIX SYSTM BONE MILL DIS (Kits) ×2 IMPLANT
CLOTH BEACON TIMEOUT ORANGE (Other) ×2 IMPLANT
CNSR MEDIVAC CRD LINER W LID (Suction) ×4 IMPLANT
CONTAINER SPECIMEN 4 OZ STRL (Procedure Accessories) ×2 IMPLANT
DRAPE 3/4 SHEET FANFLD 52X76IN (Drape) ×2 IMPLANT
DRAPE SRG TBRN CNVRT 122X106X77IN LF (Drape) ×1 IMPLANT
DRAPE SURGICAL IMPERVIOUS REINFORCEMENT FENESTRATE ABSORBENT ARMBOARD (Drape) ×1 IMPLANT
DRAPE TIBURON LAP 77X122X100IN (Drape) ×1
DRESSING STRETCH GAUZE 6INX2YD (Bandage) ×1
DRESSING TEGADERM 4X4X3/4IN (Dressing) ×3
DRESSING TRANSPARENT L4 3/4 IN X W4 IN (Dressing) ×3 IMPLANT
DRESSING TRANSPARENT L4 3/4 IN X W4 IN POLYURETHANE ADHESIVE (Dressing) ×3 IMPLANT
DRESSING TRNS PU STD TGDRM 4.75X4IN LF (Dressing) ×3
END CAP SPINAL THORACOLUMBAR CREO 5.5MM TITANIUM 11190000 (Cap) ×6 IMPLANT
EXTENDER BNGF HYALURONIC ACD POLY (Allograft) ×1 IMPLANT
EXTENDER BONE GRAFT L5 CM X W10 CM MATRIX MOLDABLE STRIP INQU (Allograft) ×1 IMPLANT
GAUZE SPONGE 4X4 NS (Dressing) ×1
GLOVE BIOGEL SURGCL INDICTR 8 (Glove) ×1
GLOVE SRG NTR RBR 8 INDCTR BGL 299X103MM (Glove) ×1
GLOVE SURG BIOGEL INDIC SZ 8.5 (Glove) ×2 IMPLANT
GLOVE SURG BIOGEL LF SZ8 (Glove) ×2 IMPLANT
GLOVE SURG BIOGEL ORTHO SZ8.5 (Glove) ×2 IMPLANT
GLOVE SURGICAL 8 INDICATOR BIOGEL POWDER (Glove) ×1 IMPLANT
GLOVE SURGICAL 8 INDICATOR BIOGEL POWDER FREE SMOOTH BEAD CUFF (Glove) ×1 IMPLANT
GRAFT BONE MATRIX STRIP 5X10CM (Allograft) ×1 IMPLANT
GRAFT BONE RHBMP-2 BOVINE COLLAGEN L23 (Bone) ×1 IMPLANT
GRAFT BONE RHBMP-2 BOVINE COLLAGEN L23 MM 2.8 ML ABSORBABLE SPONGE (Bone) ×1 IMPLANT
HANDPIECE INTERPLUSE HIGH FLOW (Cautery) ×2 IMPLANT
HOLDER CATH VLCR UNV CATH-MATE LF ADJ (Procedure Accessories) ×1
HOLDER CATH-MATE LEG STRAP (Procedure Accessories) ×1
HOLDER CATHETER UNIVERSAL ADJUSTABLE (Procedure Accessories) ×1 IMPLANT
INFUSE BN GRAFT SMALL KIT (Bone) ×2 IMPLANT
KIT DRAINAGE L12.5 IN ROUND 3 SPRING (Drain) ×1 IMPLANT
KIT DRAINAGE L12.5 IN ROUND 3 SPRING EVACUATOR Y CONNECTOR TUBE HOLE (Drain) ×1 IMPLANT
KIT DRN PVC 400CC RND 1/8IN 12.5IN LF (Drain) ×1
KIT HEMOVAC 3SPRNG 1/8IN 400CC (Drain) ×1
KIT INFECTION CONTROL CUSTOM (Kits) ×2 IMPLANT
KIT INFECTION CONTROL CUSTOM IFOH03 (Kits) ×1 IMPLANT
KIT PAT-CARE ANDREW-SPINAL-FRA (Procedure Accessories) ×2 IMPLANT
MARKER REVERE PEDICLE CYL (Procedure Accessories) ×3
MARKER SRG CYL REVERE SPNE PDCL (Procedure Accessories) ×3
MARKER SRG SPHR REVERE SPNE PDCL (Procedure Accessories) ×3
MARKER SURGICAL CYLINDER REVERE SPINE (Procedure Accessories) ×3 IMPLANT
MARKER SURGICAL CYLINDER REVERE SPINE PEDICLE (Procedure Accessories) ×3 IMPLANT
MARKER SURGICAL SPHERE REVERE SPINE (Procedure Accessories) ×3 IMPLANT
MARKER SURGICAL SPHERE REVERE SPINE PEDICLE (Procedure Accessories) ×3 IMPLANT
MASTISOL VIAL 2/3CC STRL (Skin Closure) ×2 IMPLANT
PACK SPNE FFX (Pack) ×2 IMPLANT
PAD ABD PVC CRTY 9X5IN LF STRL 3 LYR (Dressing) ×1 IMPLANT
PAD POSITIONER CRADLE HEAD PINK DEVON 4X8X9IN FOAM ADULT 31143129 (Positioning Supplies) ×1 IMPLANT
PAD TENDRSRB ABD STRL 5X9IN (Dressing) ×1
PEDIC MARKER REV SPHERE (Procedure Accessories) ×3
POSITIONER HEAD FOAM ADULT (Positioning Supplies) ×2 IMPLANT
RINGER LAC IRRIG ARHTRO (Irrigation Solutions) ×1
ROD CREO 5.5X65MM (Rods) ×2 IMPLANT
ROD SPINAL CREO L65 MM OD5.5 MM TITANIUM (Rods) ×2 IMPLANT
ROD SPINAL CREO L65 MM OD5.5 MM TITANIUM CURVE (Rods) ×2 IMPLANT
ROD SPNL TI CRV CREO 5.5MM 65MM NS (Rods) ×2 IMPLANT
SCREW BN CREO 6.5MM 45MM LF NS PA (Screw) ×4 IMPLANT
SCREW CREO 6.5X45MM (Screw) ×4 IMPLANT
SCREW CREO PREASMBLD 6.5X40MM (Screw) ×4 IMPLANT
SCREW L45 MM OD6.5 MM SPINE POLYAXIAL (Screw) ×4 IMPLANT
SCREW L45 MM OD6.5 MM SPINE POLYAXIAL PREASSEMBLE 5.5 MM ROD BONE CREO (Screw) ×4 IMPLANT
SCREW SPINAL PEDICLE POLYAXIAL SOLID CREO 6.5X40MM 51191640 (Screw) ×2 IMPLANT
SOL ALCOHOL ISOPROPYL 70% 4 OZ (Prep) ×2 IMPLANT
SOLUTION EXIDINE CHG 4% 4OZ (Prep) ×1
SOLUTION IRR LR 3L ARTHMTC LF PLS CNTNR (Irrigation Solutions) ×1
SOLUTION IRRIGATION LACTATED RINGERS (Irrigation Solutions) ×1 IMPLANT
SOLUTION IRRIGATION LACTATED RINGERS 3000 ML PLASTIC CONTAINER (Irrigation Solutions) ×1 IMPLANT
SOLUTION PRP 4% CHG 4OZ SCR CR EXDN ANMC (Prep) ×1 IMPLANT
SPONGE GAUZE L4 IN X W4 IN 12 PLY (Sponge) ×1 IMPLANT
SPONGE GAUZE L4 IN X W4 IN 16 PLY (Dressing) ×1 IMPLANT
SPONGE GAUZE L4 IN X W4 IN 16 PLY MAXIMUM ABSORBENT USP TYPE VII (Dressing) ×1 IMPLANT
SPONGE GAUZE STR 10'S 12PLY4X4 (Sponge) ×1
SPONGE GZE CTTN CRTY 4X4IN LF NS 16 PLY (Dressing) ×1
SPONGE GZE PLS CTTN CRTY 4X4IN LF STRL (Sponge) ×1
SPONGE SRG VISTEC 4X4IN LF STRL 16 PLY (Sponge) ×1
SPONGE SURGICAL L4 IN X W4 IN 16 PLY (Sponge) ×1 IMPLANT
SPONGE VISTEC 16PLY 4X4IN (Sponge) ×1
STERILE WATER 1000ML (Solution) ×1
STRIP SKIN CLOSURE L4 IN X W1/2 IN (Dressing) ×2 IMPLANT
STRIP SKIN CLOSURE L4 IN X W1/2 IN REINFORCE STERI-STRIP POLYESTER (Dressing) ×2 IMPLANT
STRIP SKNCLS PLSTR STRSTRP 4X.5IN LF (Dressing) ×2
SUT VICRYL 0 6X18IN (Suture) ×3
SUTURE ABS 0 VCL STPK 18IN BRD TIE 6 (Suture) ×3
SUTURE COATED VICRYL 0 L18 IN BRAID TIES (Suture) ×3 IMPLANT
SUTURE COATED VICRYL 0 L18 IN BRAID TIES 6 STRAND COATED UNDYED (Suture) ×3 IMPLANT
SUTURE VICRYL 2-0 CP2 8X18IN (Suture) ×4 IMPLANT
TABLECOVER OVERHEAD 80X90 POLY (Drape) ×2 IMPLANT
TAP GLOBUS CREO 5.5 (Taps) ×1
TAP SRG BCN 5.5MM SPNE (Taps) ×1
TAP SURGICAL OD5.5 MM BEACON SPINE (Taps) ×1 IMPLANT
TRAY FOLEY W URINE METER 16FR (Tray) ×2 IMPLANT
TRAY SKIN DRY SCRUB (Tray) ×2 IMPLANT
TUNNELER ONQ SHTH 17GX8IN (Procedure Accessories) ×1
TUNNELER SRG ONQ 17GA 8IN LF STRL SHTH (Procedure Accessories) ×1
TUNNELER SURGICAL L8 IN SHEATH OD17 GA (Procedure Accessories) ×1 IMPLANT
TUNNELER SURGICAL L8 IN SHEATH OD17 GA ON-Q* (Procedure Accessories) ×1 IMPLANT
WATER STERILE PVC FREE DEHP FREE 1000 ML (Solution) ×1 IMPLANT
WATER STRL 1000ML PIC LF PVC FR DEHP-FR (Solution) ×1

## 2014-05-14 NOTE — Anesthesia Preprocedure Evaluation (Signed)
Anesthesia Evaluation    AIRWAY    Mallampati: I    TM distance: >3 FB  Neck ROM: full  Mouth Opening:full   CARDIOVASCULAR    regular and normal       DENTAL         PULMONARY    clear to auscultation     OTHER FINDINGS    Upper braces; post. caps                  Anesthesia Plan    ASA 2     general                                     Plan discussed with CRNA.

## 2014-05-14 NOTE — Transfer of Care (Signed)
Anesthesia Transfer of Care Note    Patient: [name]    Procedures performed: Procedure(s) with comments:  LAMINECTOMY, POSTERIOR LUMBAR, DECOMP, FUSION, LEVEL 2 - LAMINECTOMY AND FUSION L4-S1,BMP-2      Anesthesia type: General ETT    Patient location:Phase I PACU    Last vitals:   Filed Vitals:    05/14/14 1319   BP:    Pulse: 79   Temp: 36.2 C (97.2 F)   Resp: 12   SpO2: 100%       Post pain: Patient not complaining of pain, continue current therapy      Mental Status:sedated    Respiratory Function: tolerating face mask    Cardiovascular: stable    Nausea/Vomiting: patient not complaining of nausea or vomiting    Hydration Status: adequate    Post assessment: no apparent anesthetic complications

## 2014-05-14 NOTE — Progress Notes (Signed)
Patient OOB with assist to chair at this time-back brace in place

## 2014-05-14 NOTE — Anesthesia Postprocedure Evaluation (Signed)
Anesthesia Post Evaluation    Patient: [name]    Procedures performed: Procedure(s) with comments:  LAMINECTOMY, POSTERIOR LUMBAR, DECOMP, FUSION, LEVEL 2 - LAMINECTOMY AND FUSION L4-S1,BMP-2      Anesthesia type: General ETT    Patient location:Phase I PACU    Last vitals:   Filed Vitals:    05/14/14 1450   BP: 123/73   Pulse: 82   Temp: 35.1 C (95.1 F)   Resp: 16   SpO2: 99%       Post pain: Patient not complaining of pain, continue current therapy      Mental Status:awake    Respiratory Function: tolerating face mask    Cardiovascular: stable    Nausea/Vomiting: patient not complaining of nausea or vomiting    Hydration Status: adequate    Post assessment: no apparent anesthetic complications

## 2014-05-14 NOTE — Interval H&P Note (Signed)
Surgery Admission Updated Note:        No change in above spinal history or exam findings  Since H+P  notes were written.             Tereso Unangst P. Shenique Childers M.D

## 2014-05-14 NOTE — Plan of Care (Signed)
Problem: Safety  Goal: Patient will be free from injury during hospitalization  Outcome: Progressing    Problem: Pain  Goal: Patient's pain/discomfort is manageable  Outcome: Progressing    Problem: Moderate/High Fall Risk Score >/=15  Goal: Patient will remain free of falls  Outcome: Progressing    Problem: Day of Surgery- Lumbar Discectomy/Laminectomy/Fusion  Goal: Free from Infection  Outcome: Progressing  Goal: Hemodynamic Stability  Outcome: Progressing  Goal: Mobility/activity is maintained at optimum level for patient  Outcome: Progressing  Goal: Patient will maintain normal GI status  Outcome: Progressing  Goal: Stable Neurovascular Status  Outcome: Progressing

## 2014-05-14 NOTE — OR Nursing (Signed)
Skin condition noted while positioning patient, small dime size redness on left upper back and left lateral thigh. Dr. Eather Colas.

## 2014-05-14 NOTE — Progress Notes (Signed)
Report given to J. Spina, RN who is assuming care of patient.

## 2014-05-14 NOTE — Progress Notes (Signed)
Received patient from PACU per bed, alert, iv fluids infusing without difficulty, PCA in use, rates pain 5/10, encouraged PCA use prn, ,  Dressing dry and intact to back, neurovascular checks wnl, Foley catheter patent and intact, PAS on, placed on Masimo continuous pulse oximetry.

## 2014-05-14 NOTE — Op Note (Signed)
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     TITLE OF PROCEDURE:  An L4 and L5 and partial S1 laminectomy with bilateral lysis of scar and  foraminotomies, freeing up the S1, L5 and L4 nerve roots bilaterally, with  placement of pedicle screws and rods from L4 to the sacrum with a  posterolateral fusion using local bone allograft and BMP.     HISTORY:  The patient is a 54 year old female who has had a previous history of an  L5-S1 laminectomy who has had recurrent pain with an MRI scan which reveals  stenosis at L4-L5 and L5-S1 with scarring with facet degeneration and  instability.  Because of her continued pain and nonresponsive to  conservative treatment, the patient is brought to surgery.     ESTIMATED BLOOD LOSS:  About 150 mL.     DRAINS:  One subcutaneous Jackson-Pratt drain.     SPECIMENS:  None.     ANESTHESIA:  General.     DESCRIPTION OF PROCEDURE:  The patient was brought to the operating room, and after intubation the  patient was placed on an Andrews frame in the usual fashion for lumbar  surgery.  The patient's back was prepped and draped and a midline incision  was made from L4 to the sacrum and dissection was taken down in a  subperiosteal fashion so as to expose the lamina of L4, the remaining  lamina of L5 and the sacrum.  Dissection was taken beyond the facet joints  so that the transverse processes of L4, L5 and the sacrum were identified.   With this done, an x-ray was obtained to confirm our levels.  Attention was  turned to the decompression.  The interspinous ligament at L3-L4 and L5-S1  was incised using a Bovie and the spinous processes of L4 and L5 were removed  using a rongeur.  All bone was  saved for the fusion and the lamina of L4  and L5 was thinned down using a rongeur.  A curved curette was then used to  elevate the ligamentum flavum from the remaining L5 lamina and a 5 mm  Kerrison punch was used to complete the laminectomy at L5 as well as the  laminectomy at L4.  Scar tissue was lysed bilaterally so that  foraminotomies could be performed, freeing up the L4, L5 and S1 nerve roots  bilaterally.  Hemostasis was controlled with bipolar cautery to control  epidural bleeding.  With the nerve roots freed up in this fashion and scar  tissue lysed, attention was then turned to the fusion as performed by Dr.  Georgette Shell with our assistance.  With the fusion completed in this fashion  and after copious irrigation, the wound was closed as per Dr. Georgette Shell with  our assistance.           D:  05/14/2014 13:03 PM by Dr. Elder Cyphers, MD [phone])  T:  05/14/2014 13:19 PM by Noni Saupe      (Conf: [id number]) (Doc ID: [id number])

## 2014-05-14 NOTE — Brief Op Note (Signed)
L4-5 L5-S1 and partial S1 laminectomy with bilateral lysis of scar and foraminotomies freeing the L4, L5, and S1 nerve roots with pedicle screws and rods L4-S1 with fusion using local bone, allograft, and BMP with 150cc EBL.

## 2014-05-14 NOTE — Op Note (Signed)
Procedure Date: 05/14/2014     Patient Type: I     SURGEON: Tharon Aquas MD  ASSISTANT:  Elder Cyphers MD     PREOPERATIVE DIAGNOSES:  Recurrent stenosis L4-L5, L5-S1 with spondylolisthesis, status post  previous lumbar laminectomy L4, L5, S1.     POSTOPERATIVE DIAGNOSES:  Recurrent stenosis L4-L5, L5-S1 with spondylolisthesis, status post  previous lumbar laminectomy L4, L5, S1 with partial facetectomies.     TITLE OF PROCEDURE:  1.  Posterior spinal fusion, L4-L5, with local bone graft, bone morphogenic  protein, InQu bone graft extender.  2.  Segmental fixation, L4-L5, L5-S1, with Globus Creole instrumentation.  3.  Additional level arthrodesis L5-S1 with local bone graft, bone  morphogenic protein, InQu bone graft extender.  4.  Assist Dr. Loree Fee in bilateral revision laminectomy of L4 with  foraminotomies.  5.  Assist Dr. Loree Fee in additional level bilateral revision laminectomy of  L5 with foraminotomies.  6.  Assist Dr. Loree Fee in additional level bilateral revision laminectomy of  S1, partial, with foraminotomies and facetectomies.  7.  Local bone graft harvest.  8.  Placement SilverSoaker catheters x2 for postoperative pain management.     COMPLICATIONS:  No complications.     ESTIMATED BLOOD LOSS:  Minimal.     ANESTHESIA:  General anesthesia.     DESCRIPTION OF PROCEDURE:  This is a very pleasant female who presented with unrelenting pain, failed  conservative management, opted for surgical intervention, understanding the  risks and benefits of surgery to include but not limited to persistent  pain, numbness, weakness, worsening symptomatology, need for further  surgery in the future, infection, nonunion, failure of fixation, loosening  of hardware, difficulty with catastrophic neurovascular complications.   These as well as other complications were discussed.  She was consented,  was taken to the operating room and general anesthesia was induced.   Preoperative antibiotics were given to the  patient.  TEDs and Venodyne  stockings were placed on the lower extremities.  Antibiotics were given 2  hours into the procedure.  Foley catheter was placed.  She was log-rolled  to the prone position on the Oval frame with all pressure points  adequately bolstered.  The lumbar spine was sterilely prepped and draped in  the usual sterile fashion.       An incision was made overlying the old scar, carried superiorly and  inferiorly.  Subperiosteal dissection ensued with lysis of adhesions down  to the lamina and facet joints at L4-L5, L5-S1 and then subperiosteal  dissection out to the transverse processes of L4, L5 and sacral ala.   Curettage of the bony elements ensued, followed by a rongeur, removing the  ligamentous structures, removing all soft tissue attachments, and a rongeur  was used to remove the spinous processes for bone graft.  Dr. Loree Fee then  provided completion revision laminectomy with lysis of adhesions at L4-L5,  L5-S1, and he will dictate that portion of the procedure.     After completion of decompression, pulsatile lavage ensued up to 3 liters.   Curettage of the bony elements of the transverse processes, pars  articularis and facet joints ensued, followed by using a bur for  decortication.  The pedicle markers were placed in the usual fashion from  L4 to the sacrum and confirmed with x-ray, and a joystick was then used to  sound the pedicles, feeling the inner 4 quadrants and the medial borders to  ensure there was no cortical breech from L4 to  the sacrum.  Local bone  graft was placed in the lateral gutters, followed by rolled collagen  sponge, local bone graft and bone morphogenic protein, finally followed by  InQu bone graft extender rolled on local bone.  Once this was packed into  the lateral gutters from L4 to the sacrum the screws were then placed,  again feeling the medial border of the pedicles to ensure there was no  cortical breech.  Excellent fixation was obtained.  The rods were  attached,  secured and tightened with the caps and torqued down appropriately in the  usual fashion.  The retractors were removed after the neural elements were  maintained and confirmed that they were free.  Bipolar electrocautery was  used for hemostasis.       The vancomycin powder was then spread along the musculature and fascia of  the lumbar spinal wound and SilverSoaker catheters x2 for postoperative  pain management were placed subfascially with 0.5% Marcaine infiltration,  taking care not to enter the fusion area or the decompression area.  The  wound was then closed using 0 Vicryl in the fascia, followed by lavage once  again in the subcutaneous tissue, followed by subcutaneous subcuticular  closure with Steri-Strips over a drain and over the remaining vancomycin  powder totalling 500 mg total.  Sterile dressings were applied as well as  Steri-Strips, and then the patient was taken to the recovery room in stable  condition having tolerated the procedure well.  X-rays confirmed placement  of the instrumentation.           D:  05/14/2014 13:06 PM by Dr. Tharon Aquas, MD [phone])  T:  05/14/2014 13:26 PM by Noni Saupe      Everlean Cherry: [id number]) (Doc ID: [id number])

## 2014-05-15 ENCOUNTER — Encounter: Payer: Self-pay | Admitting: Orthopaedic Surgery

## 2014-05-15 ENCOUNTER — Ambulatory Visit: Payer: No Typology Code available for payment source

## 2014-05-15 LAB — HEMOGLOBIN AND HEMATOCRIT, BLOOD
Hematocrit: 33.5 % — ABNORMAL LOW (ref 37.0–47.0)
Hgb: 11.1 g/dL — ABNORMAL LOW (ref 12.0–16.0)

## 2014-05-15 NOTE — Progress Notes (Signed)
Case Management Initial Discharge Planning Assessment     Patient is a Medicare focused diagnosis patient? Yes/No No   Patient is a 30 day INPATIENT readmission (current inpatient admission and another inpatient admission within 30 days)? Yes/No No      EPIC Documentation  CM Assessment complete button selected in CM Navigator? Yes/No Yes   Readmissions flowsheet completed in CM Navigator if patient is readmission? Yes/No N/a       Psychosocial/Demographic Information   Name of interviewee/s:  Patient/Sister   Orientation and decision making abilities of patient (ie a&ox3 able to make decisions, demented patient, patient on vent, etc)    Alert and oriented x 3   Does the patient have an Advance Directive? Location? (home/on chart, if home-advised to bring in copy?) <no information>  Advance Directive: Patient has advance directive, copy not in chart]   Healthcare Decision Maker (HDM) (if other than the patient) Include relationship and contact information.   Self   Any additional emergency contacts? Extended Emergency Contact Information  Primary Emergency Contact: Romana Juniper, [place] [address] [place]  Home Phone: [phone]  Mobile Phone: [phone]  Relation: Sister   Pt lives with:  Living Arrangements: Alone]   Type of residence where patient lives:   Type of Home: House]  Home Layout: Two level, Stairs to enter without rails (add number in comment) (5 without rails to enter)]   Prior level of functioning (ambulation & ADL's)  Prior level of function: Independent with ADLs, Ambulates independently]   Support system-list  (i.e.church, friends, extended family, friends?)  Sister   Do you want to designate an individual who will care for or assist you upon discharge? Listed above   If yes: Please list the name, relationship, phone number, and address of the designated individual. Name: Listed above  Relationship:  Phone Number:  Address:       Correct Insurance listed on face sheet  - verified with the patient/HDM  Yes      Discharge Planning Services in Place  Name of Primary Care Physician verified in patient banner (update in patient banner if not listed) Enelow, Altamease Oiler, MD  [phone]   What DME does the patient currently own? (rolling walker, hospital bed, home O2, BiPAP/CPAP, bedside commode, cane, hoyer lift)  ]None   ]   ]   Are PT/OT services indicated? If so, has it been ordered?   Already ordered   Has the patient been to an Acute Rehab or SNF in the past?  If so, where?    No   Does the patient currently have home health or hospice/palliative services in place?  If so, list agency name.    No   Does the patient already have community dialysis set up?  If so, where?  N/a      Readmission Assessment  What is the current LACE score?  1   Is this patient an inpatient to inpatient 30 day readmission?  No   Previous admission discharge diagnosis  N/a   Was patient readmitted from a facility?     No   Patient active with Home Health?     No   Patient active with Home Hospice?    No   Contributing factors to readmission (i.e., no follow up appt on previous d/c, unable to get meds, no insurance, no social support, etc.)    Did patient/family understand what medication was for and  how to administer, symptoms to indicate worsening condition, activity and diet restrictions at time of previous d/c?     N/a         Anticipated Discharge Plan  Anticipated Disposition: Option A  Home  At sisters home with Home PT/RW/BSC   Anticipated Disposition: Option B  Possible Rehab   Who will transport the patient upon discharge?  Sister   If applicable, were SNF or Hospice choices provided?  N/a at this time   Palliative Care Consult needed? (if yes, contact attending MD)  N/a      Medicare/Medicare HMO Patients Only  If this patient is inpatient, was an initial IMM signed within 24 hours of admission? (Look in media tab, documents table or shadow chart)  N/a     Is this patient identified as a  Medicare focused diagnosis or readmission? Yes/No No      Met with the patient and her sister. Educated them both on the role  of Case Management. Informed that she lives alone, She is having issues with pain management today which is limiting her participation with PT/OT. Initially she was going to discharge to her own home, but after discussing this concern with them, her sister stated that she could come to her home after discharge. Both she and her husband would be available for oversight for the patient.  Her sister is Marjean Donna [address] [place] [address], [place] # [id number] # [phone]. The patient's cell# is [phone].  The physical mentioned the possibility of some type of rehab. This to be addressed if the patient does not progress. At present OT eval is pending. The home health liaison is aware of potential home discharge needs. Case Management will continue to monitor for final discharge plan.

## 2014-05-15 NOTE — OT Progress Note (Signed)
Occupational Therapy Note    Skilled OT eval attempted but pt found to be in 10/10 pain.  Will re-attempt as time allows.  Eudelia Bunch, OT 05/15/2014

## 2014-05-15 NOTE — Plan of Care (Signed)
Problem: Safety  Goal: Patient will be free from injury during hospitalization  Outcome: Progressing  Pt will be free from injury/falls. Call bell within reach. Hourly rounding performed. Bed alarm on.    Problem: Pain  Goal: Patient's pain/discomfort is manageable  Outcome: Progressing  Pt pain being controlled with PCA. Pt stated only needs it when she wakes up.    Problem: Day of Surgery- Lumbar Discectomy/Laminectomy/Fusion  Goal: Mobility/activity is maintained at optimum level for patient  Outcome: Progressing  Pt up to the chair with back brace.

## 2014-05-15 NOTE — Plan of Care (Signed)
Problem: Safety  Goal: Patient will be free from injury during hospitalization  Outcome: Progressing  Call bell use reinforced; bed in lowest position; hourly rounding; call bell within reach.    Problem: Pain  Goal: Patient's pain/discomfort is manageable  Outcome: Progressing  Patient's pain managed by repositioning and roxicodone.    Problem: Psychosocial and Spiritual Needs  Goal: Demonstrates ability to cope with hospitalization/illness  Outcome: Progressing  Family included in care.    Problem: Day 1 Post-op- Lumbar Discectomy/Laminectomy/Fusion  Goal: Stable Neurovascular Status  Outcome: Progressing  Patient's neurovascular status is stable; ambulating in room without difficulty.

## 2014-05-15 NOTE — Progress Notes (Signed)
Pt interviewed and denies any anesthestic complications.  Had some mild nausea once she started eating breakfast but feels like it's resolving.   RN giving Zofran now.    Chart reviewed for settings on PCA and APS.      Mental status: awake and oriented  Respiratory function: stable  No complaints. Pain well controlled under current regimen.    No significant side effects.     Pt tolerating PO.  PCA transitioned to PO pain meds.  Please reconsult pain service as needed.

## 2014-05-15 NOTE — Progress Notes (Signed)
Patient with post op back pain. No pre-op leg pain. Continue attempts at mobilization today. Dressings dry.

## 2014-05-15 NOTE — PT Eval Note (Signed)
[facility]  Physical Therapy Evaluation    Patient: [name] MRN: [id number]   Unit: 5NEW ORTHOPEDICS    Bed: K530/K530-01    Assessment:   Assessment: Decreased LE ROM;Decreased LE strength;Decreased endurance/activity tolerance;Decreased functional mobility;Decreased balance;Gait impairment, resulting in difficulty with ambulation.  Prognosis: Good;With continued PT status post acute discharge    Interdisciplinary Communication:   Communicated with Pam, RN in person regarding pt's functional mobility status.    Plan:   Recommendation  Discharge Recommendation: Home with home health PT (vs additional rehab in an inpatient setting.)  DME Recommended for Discharge: Front wheel walker;BSC  PT - Next Visit Recommendation: 05/16/14  PT Frequency: 4-5x/wk  Patient Goal: Return home   Risks/Benefits/POC Discussed with Pt/Family: With patient   Treatment/Interventions: Exercise;Gait training;Neuromuscular re-education;Functional transfer training;LE strengthening/ROM;Endurance training;Cognitive reorientation     Goals  Goal Formulation: With patient  Time for Goal Acheivement: By time of discharge  Goals: Select goal  Pt Will Perform Sit to Stand: with contact guard assist  Pt Will Ambulate: 51-100 feet;with rolling walker;with contact guard assist  Pt Will Go Up / Down Stairs: 3-5 stairs;with minimal assist;With rail;With [facility]  Recommended mode of transportation at discharge: Car    Education:   Educated patient/caregiver to role of physical therapy, plan of care, transfer training techniques, gait training techniques with RW, home safety, next appropriate level of care.    Patient/caregiver demonstrated good understanding.  Will benefit from further training of ambulation.  Discussed goals of therapy with patient/caregiver, in agreement with plan.    Evaluation:   Consult received for [name] for PT evaluation and treatment.  Chart reviewed.  Patient's medical condition is appropriate  for Physical Therapy intervention at this time.     Medical Diagnosis: Lumbar stenosis [M48.06]  Spondylolisthesis [M43.10]    Therapy Diagnosis: difficulty walking, generalized muscle weakness    Precautions  Weight Bearing Status: no restrictions  Precaution Instructions Given to Patient: Yes  Back Brace Applied: yes  Spinal Precautions: no bending;no twisting;no lifting  Other Precautions: Falls    History of Present Illness: [name] is a 54 y.o. female admitted on 05/14/2014 with Fusion L4-S1.    Patient Active Problem List   Diagnosis   . Synovial cyst   . Stenosis, spinal, lumbar     Past Medical History   Diagnosis Date   . PONV (postoperative nausea and vomiting)    . GERD (gastroesophageal reflux disease)    . Arthritis      THUMB   . Rash      PSORIASIS   . Headache(784.0)      MIGRAINES, headache today   . Low back pain      spinal stenosis, right sided pain, no neuro deficits   . 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 Varicose veins      [facility]   . Scoliosis      slight curvature-noted by orthopedist a many years ago   . Anemia      with pregnancy     Past Surgical History   Procedure Laterality Date   . Bunionectomy  2003   . Esophagogastroduodenoscopy       X3   . Colonoscopy  02/2011   . Thyroid surgery  age 58     benign mass removed   . Arthrodesis  Feb 2013  Left foot   . Breast surgery  1993     right benign hardened cyst   . Laminectomy, lumbar, removal hnp, level 1  02/28/2013     Procedure: LAMINECTOMY, LUMBAR, REMOVAL HNP, LEVEL 1;  Surgeon: Marnee Guarneri, MD;  Location:  [facility];  Service: Neurosurgery;  Laterality: Right;  RIGHT L5-S1 LUMBAR FORAMINOTOMY W/EXCISION OF SYNOVIAL CYST   . Back surgery  Nov. 2014     nerve opening   . Skin biopsy       various times-brown spots removed by dermatologist   . Spine surgery  Nov. 2014     nerve opening   . Laminectomy, posterior lumbar, decomp, fusion, level 2  N/A 05/14/2014     Procedure: LAMINECTOMY, POSTERIOR LUMBAR, DECOMP, FUSION, LEVEL 2;  Surgeon: Tharon Aquas, MD;  Location: [facility];  Service: Orthopedics;  Laterality: N/A;  LAMINECTOMY AND FUSION L4-S1,BMP-2       Prior Level of Function  Prior level of function: Independent with ADLs;Ambulates independently  Baseline Activity Level: Community ambulation  Driving: independent  Cooking: Yes  Employment: FT  Home Living Arrangements  Living Arrangements: Alone  Type of Home: House  Home Layout: Two level;Stairs to enter without rails (add number in comment) (5 without rails to enter)    Subjective: Patient is agreeable to participation in the therapy session.   Pain Assessment  Pain Assessment: Numeric Scale (0-10)  Pain Score: 8-severe pain  Pain Location: Back  Pain Orientation: Lower  Pain Descriptors: Spasm  Pain Frequency: Increases with movement  Pain Intervention(s): Repositioned          Objective:  Patient is in bed with peripheral IV in place.    Observation of patient/vitals  Inspection/Posture  Inspection/Posture: WFL  Cognition/Neuro Status  Arousal/Alertness: Appropriate responses to stimuli  Attention Span: Appears intact  Orientation Level: Oriented X4  Memory: Appears intact  Following Commands: Follows all commands and directions without difficulty  Safety Awareness: independent  Insights: Fully aware of deficits  Behavior: attentive;calm;cooperative  Neuro Status RETIRED  Behavior: attentive;calm;cooperative    Musculoskeletal Examination  Gross ROM  Right Lower Extremity ROM: within functional limits  Left Lower Extremity ROM: within functional limits                                  Sensation: Grossly Intact    Gross Strength  Right Lower Extremity Strength: within functional limits  Left Lower Extremity Strength: within functional limits                Functional Mobility  Sit to Stand: Minimal Assist  Stand to Sit: Minimal Assist       Balance  Standing - Static:  Fair  Standing - Dynamic: Fair        Locomotion  Ambulation: Minimal Assist;with front-wheeled walker  Ambulation Distance (Feet): 15 Feet (x 2)  Pattern: shuffle    Participation and Endurance  Participation Effort: excellent  Endurance: Tolerates 10 - 20 min exercise with multiple rests       G codes  Based on AM-PAC Current:         Goal:         Discharge:    Time Calculation  PT Received On: 05/15/14  Start Time: 1320  Stop Time: 1344  Time Calculation (min): 24 min    Signature: Jacqulyn Cane, PT

## 2014-05-15 NOTE — Progress Notes (Signed)
Tharon Aquas, M.D.    Spine Surgeon     Lumbar Laminectomy and Fusion     POD #1     Patient doing well, without new complaints except normal post op  back pain  Denies worsening leg pain or significant numbness     VSS  Wound Clean/Dry  Neurovascular exam without change  No calf pain or swelling  Assessment:  Stable  POD#1.   Ambulate and Progress with physical therapy.  Advance diet as tolerated.    D/C PCA and begin po pain meds.  Plan to D/C Foley in am with post void residual checks and str. cath as needed.     Except for nausea she is doing well          Tharon Aquas M.D

## 2014-05-16 ENCOUNTER — Other Ambulatory Visit: Payer: Self-pay

## 2014-05-16 LAB — HEMOGLOBIN AND HEMATOCRIT, BLOOD
Hematocrit: 32.1 % — ABNORMAL LOW (ref 37.0–47.0)
Hgb: 10.5 g/dL — ABNORMAL LOW (ref 12.0–16.0)

## 2014-05-16 MED ORDER — BACITRACIN ZINC 500 UNIT/GM EX OINT
TOPICAL_OINTMENT | Freq: Once | CUTANEOUS | Status: AC
Start: 2014-05-16 — End: 2014-05-16
  Filled 2014-05-16: qty 28.35
  Filled 2014-05-16: qty 1

## 2014-05-16 MED ORDER — HYDROCODONE-ACETAMINOPHEN 5-325 MG PO TABS
2.0000 | ORAL_TABLET | ORAL | Status: DC | PRN
Start: 2014-05-16 — End: 2014-05-16
  Administered 2014-05-16 (×2): 2 via ORAL
  Filled 2014-05-16 (×2): qty 2

## 2014-05-16 MED ORDER — HYDROCODONE-ACETAMINOPHEN 5-325 MG PO TABS
2.0000 | ORAL_TABLET | ORAL | 0 refills | Status: DC | PRN
Start: 2014-05-16 — End: 2015-08-29
  Filled 2014-05-16: qty 40, 4d supply, fill #0

## 2014-05-16 MED ORDER — METHOCARBAMOL 750 MG PO TABS
750.0000 mg | ORAL_TABLET | Freq: Three times a day (TID) | ORAL | 0 refills | Status: DC | PRN
Start: 2014-05-16 — End: 2015-08-29
  Filled 2014-05-16: qty 40, 14d supply, fill #0

## 2014-05-16 MED ORDER — OXYCODONE HCL ER 10 MG PO T12A
10.0000 mg | EXTENDED_RELEASE_TABLET | Freq: Two times a day (BID) | ORAL | 0 refills | Status: DC
Start: 2014-05-16 — End: 2015-08-29
  Filled 2014-05-16: qty 20, 10d supply, fill #0

## 2014-05-16 MED ORDER — HYDROCODONE-ACETAMINOPHEN 5-325 MG PO TABS
1.0000 | ORAL_TABLET | ORAL | Status: DC | PRN
Start: 2014-05-16 — End: 2014-05-16

## 2014-05-16 NOTE — PT Progress Note (Signed)
Physical Therapy Note    [facility]  Physical Therapy Treatment    Patient:  [name] MRN#:  [id number]  Unit:  5NEW ORTHOPEDICS Room/Bed:  K530/K530-01    Assessment:   After today's treatment patient able to ambulate 150 and perform steps.  Pt is safe for discharge home w/ family and HHPT at this time  Assessment: Decreased endurance/activity tolerance;Decreased functional mobility;Decreased balance;Gait impairment, resulting in continued difficulty with ambulation.  Prognosis: Good;With continued PT status post acute discharge    Interdisciplinary Communication:   Patient up in chair and call bell within reach. Communicated with Ermie, RN in person regarding pt's functional mobility status and discharge needs.    Education:   Further education provided to patient/caregiver on, HEP, safety with mobility and ADLs.  Demonstrated good understanding with all.  Will benefit from further training to focus on ambulation.      Plan:   Goals  Pt Will Perform Sit to Stand: Goal met  Pt Will Ambulate: Goal met  Pt Will Go Up / Down Stairs: Goal met  Plan  Risks/Benefits/POC Discussed with Pt/Family: With patient  Treatment/Interventions: Exercise;Gait training;Neuromuscular re-education;Functional transfer training;LE strengthening/ROM;Endurance training  PT Frequency: 4-5x/wk    Continue plan of care.    Recommendation  Discharge Recommendation: Home with supervision;Home with home health PT  DME Recommended for Discharge: Front wheel walker;BSC  PT - Next Visit Recommendation: 05/17/14 (Pt wil require new goals if not discharged this PM.)  PT Frequency: 4-5x/wk  Recommended mode of transportation at discharge: Car    Treatment:     Time of treatment: Start Time: 1515 Stop Time: 1545  Time Calculation (min): 30 min  PT Received On: 05/16/14    Treatment # 1    Precautions  Weight Bearing Status: no restrictions  Precaution Instructions Given to Patient: Yes  Back Brace Applied: yes  Spinal  Precautions: no bending;no twisting;no lifting  Other Precautions: falls    Patient's medical condition is appropriate for Physical Therapy intervention at this time.     Subjective:   Patient is agreeable to participation in the therapy session.                 Objective:  Patient is seated in a bedside chair with On-Q pump in place.                                                                    Functional Mobility  Sit to Stand: Contact Guard Assist  Stand to Sit: Contact Guard Assist  Pt required verbal and tactile cues for technique, sequencing, and hand placement w/ transfers.         Locomotion  Ambulation: Contact Guard Assist;with front-wheeled walker  Ambulation Distance (Feet): 150 Feet  Pattern: shuffle  Pt required verbal and tactile cues for technique, sequencing, walker placement, speed, and safety w/ ambulation.     Stair Management: Contact Guard Assist;one rail R;with cane  Number of Stairs: 4 (and 4 w/ cane and min A x 1 without rail.)  Pt required verbal and tactile cues for technique, sequencing, cane placement, hand placement, and safety w/ stairs.            Signature: Jacqulyn Cane, PT

## 2014-05-16 NOTE — Progress Notes (Signed)
[name], M.D.    Spine Surgeon     Lumbar Laminectomy and Fusion     POD #2     Patient improving, without new complaints except normal post op  back pain, pain management has been an issue but Norco now seems to be working  Denies worsening leg pain or significant numbness     VSS  Wound Clean/Dry, dressing changed   Neurovascular exam without change  No calf pain or swelling  Assessment:  Stable and improving  POD#2.   Ambulate and Progress with physical therapy.  Plan for D/C to home today if tolerating physical therapy and pain control.  Social work to detail rehabilitative or home health services as required.  Patient instructed in wound care, neurovascular checks and Follow up in Two weeks.           [name] M.D

## 2014-05-16 NOTE — Discharge Instructions (Signed)
Spinal Surgery  Byan P. Silveri, M.D.  [facility]      Post-Operative Lumbar Laminectomy and Fusion Instructions      I. General Instructions     No driving a care for 4 weeks; however, you may ride as a messenger in the care at any time.    You may shower five days after surgery but no tub baths for 2 weeks   No heavy lifting greater than 10-15 pounds for 4-6 weeks.   No sex for 2 weeks.   You may climb stairs at any time and inside/outside walking is unrestricted.    Avoid sitting for extended periods of time for the first two weeks. You may sit in a chair that elevates your legs and reclines your back (such as a couch or recliner) for as long as you are comfortable. Initially postoperatively, you may sit for eating and for going to the bathroom only, otherwise, walk as much as possible.   Do not schedule dental visits until four weeks following surgery.   You must wear Corset or Brace any time out of bed for three months.    II. Wound Care     As mentioned above, you may shower five days after surgery after which you may pat the wound dry with a towel. On most incisions, there will be paper tapes. These may be allowed to fall off, or you may pull them off after one week from the day of surgery. A dry sterile dressing may be kept on the wound until drainage ends.   Swelling of the wound area is normal during the first three to four weeks after surgery.   In most instances, sutures are under the skin and will dissolve on their own. In cases where stapes are removed, these will be removed at one to two weeks postoperatively in the office.   A small amount of blood-tinged drainage is normal postoperatively. If this continues more than two to three days after surgery or if the wound begins to get red and more inflamed or if a fever of greater than 101 degrees develops, please call the office.    III. Pain    Many patients will have pain pre-operatively which to a great degree  should be diminished post-operatively; however, because of inflammation, even after a complete decompression of the nerve roots, inflammatory pain may persist for some weeks after surgery. This can be expected to subside in time. Back pain will also be increased after surgery because of surgery about the muscles around the spine. This too will subside. In the post-operative period, you will need to frequently rest and gradually increase your activities as you become stronger and feel better. After discharge, a prescription for pain medication will be given; however, if you find that less potent medications relieve your discomfort, these should be taken instead of the prescription medications.     Do not take any anti-inflammatory medications such as Advil, Motrin, Aleve, ibuprofen, Naprosyn, or aspirin products.     Prescription refills will not be called in at night or on weekends.    IV. Recovery    Modern microsurgical techniques do minimize the amount of structural damage caused by surgery, however fusion surgery requires extensive soft tissue exposure and subsequently a longer recovery. It will require three months for complete healing. In that time your back range of motion will be limited. Exercise for the back will be introduced at approximately three months after surgery and slowly progressed.   In most cases physical therapy will be used to facilitate the rehab process. The goal of the treatment in this situation is to have you assume full range of motion and physical activities and return to a normal lifestyle without the fear of experiencing more pain. In the first few months, however, no exercises are to be done other than walking.    V. In Summary   Not all patients will immediately improve in their back or leg pain, numbness or weakness. In many instances, a dramatic recovery of all symptoms may occur within days or weeks after surgery; however, it is not unusual for patients to describe residual pain,  especially in the back area for a period of weeks or months after surgery. Numbness also will be slow to respond and can take weeks or months to improve, and this should not be a source of concern. Finally any preoperative weakness will be present after surgery and will take months to up to one year to respond to its final level of function. This is inherent in lumbar spine surgery and should not be a concern as well.      REASONS TO CALL THE DOCTOR IMMEDIATELY [phone]     Increased weakness during the recovery period.   Loss of bowel or bladder control.   Development of severe pain more intense than prior to surgery.   Wound drainage, increased swelling, and/or redness about the wound area.   Persistent fever greater than 101 degrees Fahrenheit by mouth.      [facility]  [address]  [place], [place] [address]    Glyn Ade, M.D.  Tharon Aquas, M.D.  Joni Reining, M.D.    ----------------------------------------------------------------------------------------------------    Lumbar Spinal Fusion Further Instructions:    1. After a lumbar spinal fusion surgery, please be mindful of the fact that dressings need to be changed daily once home.  The nurses at the hospital should provide you with a supply of dressings and instructions as how to perform the dressing changes.  Please ensure that you are instructed as to hwo to accomplish this postoperatively prior to your discharge.    2. In many cases Dr. Georgette Shell uses an On-Q pain pump with catheters next to the lumbar spine to help with pain control postoperatively.  This is a catheter system that you will go home with.  They are very small.  They are painless and you should be instructed by the nursing staff to remove these catheters on day #5 postoperatively.  In the event that you are not educated as such, please ensure that these catheters are removed by simply removing all tape, and gently sliding  them out upwards towards the head, making sure that the entire catheter is removed bilaterally.  The catheter itself has a black tip, which should be visualized once it is removed from the skin on each side.  This will ensure that the entire catheter is removed.  If any questions arise, please do not hesitate to call the office.    3. As the majory of patients who undergo a lumbar spinal fusion with instrumentation are discharged on day #2, it is imperative that prior to surgery you plan on the appropriate support at home to assist in cooking, cleaning, sometimes helping you dress, and assisting you with activities of daily living.  Family, fiends, or neighbors can assist in this endeavor.  However, if you find preoperatively that you will not have help  at home, please let us know prior to the day of surgery.  Also discuss this with the Social Work Department while you are in the hospital, so they can make plans to have you discharged to a rehabilitation facility if need be depending on your activity level and pain control.    Tharon Aquas, M.D.      Medication Education    Oxycodone Hydrochloride Oral tablet, extended-release  What is this medicine?  OXYCODONE (ox i KOE done) is a pain reliever. It is used to treat constant pain that lasts for more than a few days.  This medicine may be used for other purposes; ask your health care provider or pharmacist if you have questions.  What should I tell my health care provider before I take this medicine?  They need to know if you have any of these conditions:   Addison's disease   brain tumor   drug abuse or addiction   gallbladder disease   head injury   heart disease   if you frequently drink alcohol-containing drinks   kidney disease or problems going to the bathroom   liver disease   lung disease, asthma, or breathing problems   mental problems   pancreatic disease   seizures   stomach or intestine problems   thyroid disease   trouble  swallowing   an unusual or allergic reaction to oxycodone, codeine, hydrocodone, morphine, other medicines, foods, dyes, or preservatives   pregnant or trying to get pregnant   breast-feeding  How should I use this medicine?  Take this medicine by mouth with a full glass of water. Follow the directions on the prescription label. Do not cut, crush or chew this medicine. Swallow only one tablet at a time. Do not wet, soak, or lick the tablet before you take it. You can take it with or without food. If it upsets your stomach, take it with food. Take your medicine at regular intervals. Do not take it more often than directed. Do not stop taking except on your doctor's advice.  A special MedGuide will be given to you by the pharmacist with each prescription and refill. Be sure to read this information carefully each time.  Talk to your pediatrician regarding the use of this medicine in children. Special care may be needed.  Overdosage: If you think you have taken too much of this medicine contact a poison control center or emergency room at once.  NOTE: This medicine is only for you. Do not share this medicine with others.  What if I miss a dose?  If you miss a dose, take it as soon as you can. If it is almost time for your next dose, take only that dose. Do not take double or extra doses.  What may interact with this medicine?   alcohol   antihistamines   carbamazepine   certain medicines used for nausea like chlorpromazine, droperidol   erythromycin   ketoconazole   medicines for depression, anxiety, or psychotic disturbances   medicines for sleep   muscle relaxants   naloxone   naltrexone   narcotic medicines (opiates) for pain   nilotinib   phenobarbital   phenytoin   rifampin   ritonavir   voriconazole  This list may not describe all possible interactions. Give your health care provider a list of all the medicines, herbs, non-prescription drugs, or dietary supplements you use. Also tell them if  you smoke, drink alcohol, or use illegal drugs. Some items may  interact with your medicine.  What should I watch for while using this medicine?  Tell your doctor or health care professional if your pain does not go away, if it gets worse, or if you have new or a different type of pain. You may develop tolerance to the medicine. Tolerance means that you will need a higher dose of the medication for pain relief. Tolerance is normal and is expected if you take this medicine for a long time.  Do not suddenly stop taking your medicine because you may develop a severe reaction. Your body becomes used to the medicine. This does NOT mean you are addicted. Addiction is a behavior related to getting and using a drug for a non-medical reason. If you have pain, you have a medical reason to take pain medicine. Your doctor will tell you how much medicine to take. If your doctor wants you to stop the medicine, the dose will be slowly lowered over time to avoid any side effects.  You may get drowsy or dizzy when you first start taking the medicine or change doses. Do not drive, use machinery, or do anything that may be dangerous until you know how the medicine affects you. Stand or sit up slowly.  There are different types of narcotic medicines (opiates) for pain. If you take more than one type at the same time, you may have more side effects. Give your health care provider a list of all medicines you use. Your doctor will tell you how much medicine to take. Do not take more medicine than directed. Call emergency for help if you have problems breathing.  This medicine will cause constipation. Try to have a bowel movement at least every 2 to 3 days. If you do not have a bowel movement for 3 days, call your doctor or health care professional.  You may see empty tablets in your stool. The tablet shell for some brands of this medicine does not dissolve. This is normal.  Your mouth may get dry. Drinking water, chewing sugarless gum, or  sucking on hard candy may help. See your dentist every 6 months.  What side effects may I notice from receiving this medicine?  Side effects that you should report to your doctor or health care professional as soon as possible:   allergic reactions like skin rash, itching or hives, swelling of the face, lips, or tongue   breathing problems   confusion   feeling faint or lightheaded, falls   trouble passing urine or change in the amount of urine   trouble swallowing   unusually weak or tired  Side effects that usually do not require medical attention (report to your doctor or health care professional if they continue or are bothersome):   constipation   dizziness   dry mouth   itching   nausea, vomiting   stomach pain   tiredness   upset stomach  This list may not describe all possible side effects. Call your doctor for medical advice about side effects. You may report side effects to FDA at 1-800-FDA-1088.  Where should I keep my medicine?  Keep out of the reach of children. This medicine can be abused. Keep your medicine in a safe place to protect it from theft. Do not share this medicine with anyone. Selling or giving away this medicine is dangerous and against the law.  Store at room temperature between 15 and 30 degrees C (59 and 86 degrees F). Protect from light. Keep container tightly closed.  This medicine may cause accidental overdose and death if it is taken by other adults, children, or pets. Flush any unused medicine down the toilet to reduce the chance of harm. Do not use the medicine after the expiration date.  NOTE: This sheet is a summary. It may not cover all possible information. If you have questions about this medicine, talk to your doctor, pharmacist, or health care provider.  NOTE:This sheet is a summary. It may not cover all possible information. If you have questions about this medicine, talk to your doctor, pharmacist, or health care provider. Copyright 2015 Gold  Standard      Hydrocodone Bitartrate, Acetaminophen Oral tablet  What is this medicine?  ACETAMINOPHEN; HYDROCODONE (a set a MEE noe fen; hye droe KOE done) is a pain reliever. It is used to treat mild to moderate pain.  This medicine may be used for other purposes; ask your health care provider or pharmacist if you have questions.  What should I tell my health care provider before I take this medicine?  They need to know if you have any of these conditions:   brain tumor   Crohn's disease, inflammatory bowel disease, or ulcerative colitis   drink more than 3 alcohol-containing drinks per day   drug abuse or addiction   head injury   heart or circulation problems   kidney disease or problems going to the bathroom   liver disease   lung disease, asthma, or breathing problems   an unusual or allergic reaction to acetaminophen, hydrocodone, other opioid analgesics, other medicines, foods, dyes, or preservatives   pregnant or trying to get pregnant   breast-feeding  How should I use this medicine?  Take this medicine by mouth. Swallow it with a full glass of water. Follow the directions on the prescription label. If the medicine upsets your stomach, take the medicine with food or milk. Do not take more than you are told to take.   Talk to your pediatrician regarding the use of this medicine in children. This medicine is not approved for use in children.  Overdosage: If you think you have taken too much of this medicine contact a poison control center or emergency room at once.  NOTE: This medicine is only for you. Do not share this medicine with others.  What if I miss a dose?  If you miss a dose, take it as soon as you can. If it is almost time for your next dose, take only that dose. Do not take double or extra doses.  What may interact with this medicine?   alcohol   antihistamines   isoniazid   medicines for depression, anxiety, or psychotic disturbances   medicines for sleep   muscle  relaxants   naltrexone   narcotic medicines (opiates) for pain   phenobarbital   ritonavir   tramadol  This list may not describe all possible interactions. Give your health care provider a list of all the medicines, herbs, non-prescription drugs, or dietary supplements you use. Also tell them if you smoke, drink alcohol, or use illegal drugs. Some items may interact with your medicine.  What should I watch for while using this medicine?  Tell your doctor or health care professional if your pain does not go away, if it gets worse, or if you have new or a different type of pain. You may develop tolerance to the medicine. Tolerance means that you will need a higher dose of the medicine for pain relief. Tolerance is  normal and is expected if you take the medicine for a long time.  Do not suddenly stop taking your medicine because you may develop a severe reaction. Your body becomes used to the medicine. This does NOT mean you are addicted. Addiction is a behavior related to getting and using a drug for a non-medical reason. If you have pain, you have a medical reason to take pain medicine. Your doctor will tell you how much medicine to take. If your doctor wants you to stop the medicine, the dose will be slowly lowered over time to avoid any side effects.  You may get drowsy or dizzy when you first start taking the medicine or change doses. Do not drive, use machinery, or do anything that may be dangerous until you know how the medicine affects you. Stand or sit up slowly.  There are different types of narcotic medicines (opiates) for pain. If you take more than one type at the same time, you may have more side effects. Give your health care provider a list of all medicines you use. Your doctor will tell you how much medicine to take. Do not take more medicine than directed. Call emergency for help if you have problems breathing.  The medicine will cause constipation. Try to have a bowel movement at least every 2  to 3 days. If you do not have a bowel movement for 3 days, call your doctor or health care professional.  Too much acetaminophen can be very dangerous. Do not take Tylenol (acetaminophen) or medicines that contain acetaminophen with this medicine. Many non-prescription medicines contain acetaminophen. Always read the labels carefully.  What side effects may I notice from receiving this medicine?  Side effects that you should report to your doctor or health care professional as soon as possible:   allergic reactions like skin rash, itching or hives, swelling of the face, lips, or tongue   breathing problems   confusion   feeling faint or lightheaded, falls   stomach pain   yellowing of the eyes or skin  Side effects that usually do not require medical attention (report to your doctor or health care professional if they continue or are bothersome):   nausea, vomiting   stomach upset  This list may not describe all possible side effects. Call your doctor for medical advice about side effects. You may report side effects to FDA at 1-800-FDA-1088.  Where should I keep my medicine?  Keep out of the reach of children. This medicine can be abused. Keep your medicine in a safe place to protect it from theft. Do not share this medicine with anyone. Selling or giving away this medicine is dangerous and against the law.  Store at room temperature between 15 and 30 degrees C (59 and 86 degrees F). Protect from light. Keep container tightly closed.  Throw away any unused medicine after the expiration date. Discard unused medicine and used packaging carefully. Pets and children can be harmed if they find used or lost packages.  NOTE: This sheet is a summary. It may not cover all possible information. If you have questions about this medicine, talk to your doctor, pharmacist, or health care provider.  NOTE:This sheet is a summary. It may not cover all possible information. If you have questions about this medicine, talk to  your doctor, pharmacist, or health care provider. Copyright 2015 Gold Standard      Methocarbamol Solution for injection  What is this medicine?  METHOCARBAMOL (meth oh KAR ba mole)  helps to relieve pain and stiffness in muscles caused by strains, sprains, or other injury to your muscles.  This medicine may be used for other purposes; ask your health care provider or pharmacist if you have questions.  What should I tell my health care provider before I take this medicine?  They need to know if you have any of these conditions:   kidney disease   seizures   an unusual or allergic reaction to methocarbamol, latex, other medicines, foods, dyes, or preservatives   pregnant or trying to get pregnant   breast-feeding  How should I use this medicine?  This medicine is for injection into a muscle or a vein. It is usually given by a health care professional in a hospital or clinic setting.  If you get this medicine at home, you will be taught how to prepare and give this medicine. Use exactly as directed. Take your medicine at regular intervals. Do not take your medicine more often than directed.  It is important that you put your used needles and syringes in a special sharps container. Do not put them in a trash can. If you do not have a sharps container, call your pharmacist or healthcare provider to get one.  Talk to your pediatrician regarding the use of this medicine in children. While this drug may be prescribed for selected conditions, precautions do apply.  Overdosage: If you think you have taken too much of this medicine contact a poison control center or emergency room at once.  NOTE: This medicine is only for you. Do not share this medicine with others.  What if I miss a dose?  If you miss a dose, take it as soon as you can. If it is almost time for your next dose, take only that dose. Do not take double or extra doses.  What may interact with this medicine?   alcohol or medicines that contain  alcohol   cholinesterase inhibitors like neostigmine, ambenonium, and pyridostigmine bromide   other medicines that cause drowsiness  This list may not describe all possible interactions. Give your health care provider a list of all the medicines, herbs, non-prescription drugs, or dietary supplements you use. Also tell them if you smoke, drink alcohol, or use illegal drugs. Some items may interact with your medicine.  What should I watch for while using this medicine?  You may get drowsy or dizzy. Do not drive, use machinery, or do anything that needs mental alertness until you know how this medicine affects you. Do not stand or sit up quickly, especially if you are an older patient. This reduces the risk of dizzy or fainting spells. Alcohol may interfere with the effect of this medicine. Avoid alcoholic drinks.  What side effects may I notice from receiving this medicine?  Side effects that you should report to your doctor or health care professional as soon as possible:   allergic reactions like skin rash, itching or hives, swelling of the face, lips, or tongue   changes in vision   confusion   dark urine   feeling faint, lightheaded   fever   nausea or vomiting   pain, loss of skin where injected   seizures   slow heartbeat   yellowing of the eyes or skin  Side effects that usually do not require medical attention (report to your doctor or health care professional if they continue or are bothersome):   dizziness   drowsiness   headache   metallic taste  This  list may not describe all possible side effects. Call your doctor for medical advice about side effects. You may report side effects to FDA at 1-800-FDA-1088.  Where should I keep my medicine?  Keep out of the reach of children.  Store at room temperature between 15 and 30 degrees C (59 and 86 degrees F). Throw away any unused medicine after the expiration date.  NOTE:This sheet is a summary. It may not cover all possible information. If you  have questions about this medicine, talk to your doctor, pharmacist, or health care provider. Copyright 2015 Gold Standard      Home Health Discharge Information    Your doctor has ordered Physical Therapy and Occupational Therapy in-home service(s) for you while you recuperate at home, to assist you in the transition from hospital to home.    The agency that you or your representative chose to provide the service:  Name of Home Health Agency:  [facility] [phone]    [employer]:  Name of DME Company: [employer] [phone]  Equipment Ordered: rolling walker and 3:1 commode-delivered to room    The above services were set up by:  Rica Koyanagi (Home Health Liaison) Phone [phone] Hydrochloride Oral tablet, extended-release  What is this medicine?  OXYCODONE (ox i KOE done) is a pain reliever. It is used to treat constant pain that lasts for more than a few days.  This medicine may be used for other purposes; ask your health care provider or pharmacist if you have questions.  What should I tell my health care provider before I take this medicine?  They need to know if you have any of these conditions:   Addison's disease   brain tumor   drug abuse or addiction   gallbladder disease   head injury   heart disease   if you frequently drink alcohol-containing drinks   kidney disease or problems going to the bathroom   liver disease   lung disease, asthma, or breathing problems   mental problems   pancreatic disease   seizures   stomach or intestine problems   thyroid disease   trouble swallowing   an unusual or allergic reaction to oxycodone, codeine, hydrocodone, morphine, other medicines, foods, dyes, or preservatives   pregnant or trying to get pregnant   breast-feeding  How should I use this medicine?  Take this medicine by mouth with a full glass of water. Follow the directions on the prescription label. Do not cut, crush or chew this medicine.  Swallow only one tablet at a time. Do not wet, soak, or lick the tablet before you take it. You can take it with or without food. If it upsets your stomach, take it with food. Take your medicine at regular intervals. Do not take it more often than directed. Do not stop taking except on your doctor's advice.  A special MedGuide will be given to you by the pharmacist with each prescription and refill. Be sure to read this information carefully each time.  Talk to your pediatrician regarding the use of this medicine in children. Special care may be needed.  Overdosage: If you think you have taken too much of this medicine contact a poison control center or emergency room at once.  NOTE: This medicine is only for you. Do not share this medicine with others.  What if I miss a dose?  If you miss a dose, take it as soon as you can.  If it is almost time for your next dose, take only that dose. Do not take double or extra doses.  What may interact with this medicine?   alcohol   antihistamines   carbamazepine   certain medicines used for nausea like chlorpromazine, droperidol   erythromycin   ketoconazole   medicines for depression, anxiety, or psychotic disturbances   medicines for sleep   muscle relaxants   naloxone   naltrexone   narcotic medicines (opiates) for pain   nilotinib   phenobarbital   phenytoin   rifampin   ritonavir   voriconazole  This list may not describe all possible interactions. Give your health care provider a list of all the medicines, herbs, non-prescription drugs, or dietary supplements you use. Also tell them if you smoke, drink alcohol, or use illegal drugs. Some items may interact with your medicine.  What should I watch for while using this medicine?  Tell your doctor or health care professional if your pain does not go away, if it gets worse, or if you have new or a different type of pain. You may develop tolerance to the medicine. Tolerance means that you will need a higher  dose of the medication for pain relief. Tolerance is normal and is expected if you take this medicine for a long time.  Do not suddenly stop taking your medicine because you may develop a severe reaction. Your body becomes used to the medicine. This does NOT mean you are addicted. Addiction is a behavior related to getting and using a drug for a non-medical reason. If you have pain, you have a medical reason to take pain medicine. Your doctor will tell you how much medicine to take. If your doctor wants you to stop the medicine, the dose will be slowly lowered over time to avoid any side effects.  You may get drowsy or dizzy when you first start taking the medicine or change doses. Do not drive, use machinery, or do anything that may be dangerous until you know how the medicine affects you. Stand or sit up slowly.  There are different types of narcotic medicines (opiates) for pain. If you take more than one type at the same time, you may have more side effects. Give your health care provider a list of all medicines you use. Your doctor will tell you how much medicine to take. Do not take more medicine than directed. Call emergency for help if you have problems breathing.  This medicine will cause constipation. Try to have a bowel movement at least every 2 to 3 days. If you do not have a bowel movement for 3 days, call your doctor or health care professional.  You may see empty tablets in your stool. The tablet shell for some brands of this medicine does not dissolve. This is normal.  Your mouth may get dry. Drinking water, chewing sugarless gum, or sucking on hard candy may help. See your dentist every 6 months.  What side effects may I notice from receiving this medicine?  Side effects that you should report to your doctor or health care professional as soon as possible:   allergic reactions like skin rash, itching or hives, swelling of the face, lips, or tongue   breathing problems   confusion   feeling faint  or lightheaded, falls   trouble passing urine or change in the amount of urine   trouble swallowing   unusually weak or tired  Side effects that usually do not require medical  attention (report to your doctor or health care professional if they continue or are bothersome):   constipation   dizziness   dry mouth   itching   nausea, vomiting   stomach pain   tiredness   upset stomach  This list may not describe all possible side effects. Call your doctor for medical advice about side effects. You may report side effects to FDA at 1-800-FDA-1088.  Where should I keep my medicine?  Keep out of the reach of children. This medicine can be abused. Keep your medicine in a safe place to protect it from theft. Do not share this medicine with anyone. Selling or giving away this medicine is dangerous and against the law.  Store at room temperature between 15 and 30 degrees C (59 and 86 degrees F). Protect from light. Keep container tightly closed.  This medicine may cause accidental overdose and death if it is taken by other adults, children, or pets. Flush any unused medicine down the toilet to reduce the chance of harm. Do not use the medicine after the expiration date.  NOTE: This sheet is a summary. It may not cover all possible information. If you have questions about this medicine, talk to your doctor, pharmacist, or health care provider.  NOTE:This sheet is a summary. It may not cover all possible information. If you have questions about this medicine, talk to your doctor, pharmacist, or health care provider. Copyright 2015 Gold Standard      Hydrocodone Bitartrate, Acetaminophen Oral capsule  What is this medicine?  ACETAMINOPHEN; HYDROCODONE (a set a MEE noe fen; hye droe KOE done) is a pain reliever. It is used to treat mild to moderate pain.  This medicine may be used for other purposes; ask your health care provider or pharmacist if you have questions.  What should I tell my health care provider before I  take this medicine?  They need to know if you have any of these conditions:   brain tumor   Crohn's disease, inflammatory bowel disease, or ulcerative colitis   drink more than 3 alcohol-containing drinks per day   drug abuse or addiction   head injury   heart or circulation problems   kidney disease or problems going to the bathroom   liver disease   lung disease, asthma, or breathing problems   an unusual or allergic reaction to acetaminophen, hydrocodone, other opioid analgesics, other medicines, foods, dyes, or preservatives   pregnant or trying to get pregnant   breast-feeding  How should I use this medicine?  Take this medicine by mouth. Swallow it with a full glass of water. Follow the directions on the prescription label. If the medicine upsets your stomach, take the medicine with food or milk. Do not take more than you are told to take.   Talk to your pediatrician regarding the use of this medicine in children. This medicine is not approved for use in children.  Overdosage: If you think you have taken too much of this medicine contact a poison control center or emergency room at once.  NOTE: This medicine is only for you. Do not share this medicine with others.  What if I miss a dose?  If you miss a dose, take it as soon as you can. If it is almost time for your next dose, take only that dose. Do not take double or extra doses.  What may interact with this medicine?   alcohol   antihistamines   isoniazid   medicines  for depression, anxiety, or psychotic disturbances   medicines for sleep   muscle relaxants   naltrexone   narcotic medicines (opiates) for pain   phenobarbital   ritonavir   tramadol  This list may not describe all possible interactions. Give your health care provider a list of all the medicines, herbs, non-prescription drugs, or dietary supplements you use. Also tell them if you smoke, drink alcohol, or use illegal drugs. Some items may interact with your medicine.  What  should I watch for while using this medicine?  Tell your doctor or health care professional if your pain does not go away, if it gets worse, or if you have new or a different type of pain. You may develop tolerance to the medicine. Tolerance means that you will need a higher dose of the medicine for pain relief. Tolerance is normal and is expected if you take the medicine for a long time.  Do not suddenly stop taking your medicine because you may develop a severe reaction. Your body becomes used to the medicine. This does NOT mean you are addicted. Addiction is a behavior related to getting and using a drug for a non-medical reason. If you have pain, you have a medical reason to take pain medicine. Your doctor will tell you how much medicine to take. If your doctor wants you to stop the medicine, the dose will be slowly lowered over time to avoid any side effects.  You may get drowsy or dizzy when you first start taking the medicine or change doses. Do not drive, use machinery, or do anything that may be dangerous until you know how the medicine affects you. Stand or sit up slowly.  There are different types of narcotic medicines (opiates) for pain. If you take more than one type at the same time, you may have more side effects. Give your health care provider a list of all medicines you use. Your doctor will tell you how much medicine to take. Do not take more medicine than directed. Call emergency for help if you have problems breathing.  The medicine will cause constipation. Try to have a bowel movement at least every 2 to 3 days. If you do not have a bowel movement for 3 days, call your doctor or health care professional.  Too much acetaminophen can be very dangerous. Do not take Tylenol (acetaminophen) or medicines that contain acetaminophen with this medicine. Many non-prescription medicines contain acetaminophen. Always read the labels carefully.  What side effects may I notice from receiving this  medicine?  Side effects that you should report to your doctor or health care professional as soon as possible:   allergic reactions like skin rash, itching or hives, swelling of the face, lips, or tongue   breathing problems   confusion   feeling faint or lightheaded, falls   stomach pain   yellowing of the eyes or skin  Side effects that usually do not require medical attention (report to your doctor or health care professional if they continue or are bothersome):   nausea, vomiting   stomach upset  This list may not describe all possible side effects. Call your doctor for medical advice about side effects. You may report side effects to FDA at 1-800-FDA-1088.  Where should I keep my medicine?  Keep out of the reach of children. This medicine can be abused. Keep your medicine in a safe place to protect it from theft. Do not share this medicine with anyone. Selling or giving away  this medicine is dangerous and against the law.  Store at room temperature between 15 and 30 degrees C (59 and 86 degrees F). Protect from light. Keep container tightly closed.  Throw away any unused medicine after the expiration date. Discard unused medicine and used packaging carefully. Pets and children can be harmed if they find used or lost packages.  NOTE: This sheet is a summary. It may not cover all possible information. If you have questions about this medicine, talk to your doctor, pharmacist, or health care provider.  NOTE:This sheet is a summary. It may not cover all possible information. If you have questions about this medicine, talk to your doctor, pharmacist, or health care provider. Copyright 2015 Gold Standard      Methocarbamol Oral tablet  What is this medicine?  METHOCARBAMOL (meth oh KAR ba mole) helps to relieve pain and stiffness in muscles caused by strains, sprains, or other injury to your muscles.  This medicine may be used for other purposes; ask your health care provider or pharmacist if you have  questions.  What should I tell my health care provider before I take this medicine?  They need to know if you have any of these conditions:   kidney disease   seizures   an unusual or allergic reaction to methocarbamol, other medicines, foods, dyes, or preservatives   pregnant or trying to get pregnant   breast-feeding  How should I use this medicine?  Take this medicine by mouth with a full glass of water. Follow the directions on the prescription label. Take your medicine at regular intervals. Do not take your medicine more often than directed.  Talk to your pediatrician regarding the use of this medicine in children. Special care may be needed.  Overdosage: If you think you have taken too much of this medicine contact a poison control center or emergency room at once.  NOTE: This medicine is only for you. Do not share this medicine with others.  What if I miss a dose?  If you miss a dose, take it as soon as you can. If it is almost time for your next dose, take only the next dose. Do not take double or extra doses.  What may interact with this medicine?   alcohol or medicines that contain alcohol   cholinesterase inhibitors like neostigmine, ambenonium, and pyridostigmine bromide   other medicines that cause drowsiness  This list may not describe all possible interactions. Give your health care provider a list of all the medicines, herbs, non-prescription drugs, or dietary supplements you use. Also tell them if you smoke, drink alcohol, or use illegal drugs. Some items may interact with your medicine.  What should I watch for while using this medicine?  You may get drowsy or dizzy. Do not drive, use machinery, or do anything that needs mental alertness until you know how this medicine affects you. Do not stand or sit up quickly, especially if you are an older patient. This reduces the risk of dizzy or fainting spells. Alcohol may interfere with the effect of this medicine. Avoid alcoholic drinks.  What  side effects may I notice from receiving this medicine?  Side effects that you should report to your doctor or health care professional as soon as possible:   allergic reactions like skin rash, itching or hives, swelling of the face, lips, or tongue   blurred vision or changes in vision   confusion   fainting spells   fever   nausea or  vomiting   seizures  Side effects that usually do not require medical attention (report to your doctor or health care professional if they continue or are bothersome):   dizziness   drowsiness   headache   metallic taste  This list may not describe all possible side effects. Call your doctor for medical advice about side effects. You may report side effects to FDA at 1-800-FDA-1088.  Where should I keep my medicine?  Keep out of the reach of children.  Store at room temperature between 20 and 25 degrees C (68 and 77 degrees F). Keep container tightly closed. Throw away any unused medicine after the expiration date.  NOTE:This sheet is a summary. It may not cover all possible information. If you have questions about this medicine, talk to your doctor, pharmacist, or health care provider. Copyright 2015 Gold Standard

## 2014-05-16 NOTE — Progress Note - Problem Oriented Charting Notewrit (Signed)
Home Health Referral          Referral from Cyndia Bent RN (Case Manager) for home health care upon discharge.    By [employer], the patient has the right to freely choose a home care provider.  Arrangements have been made with:     A company of the patients choosing. We have supplied the patient with a listing of providers in your area who asked to be included and participate in Medicare.   [facility], a home care agency that provides both adult home care services which is a wholly owned and operated by [employer] and participates in [employer]   The preferred provider of your insurance company. Choosing a home care provider other than your insurance company's preferred provider may affect your insurance coverage.    The Home Health Care Referral Form acknowledging the voluntary selection of the home care company has been completed, signed, and is on file.      Home Health Discharge Information     Your doctor has ordered Physical Therapy and Occupational Therapy in-home service(s) for you while you recuperate at home, to assist you in the transition from hospital to home.    The agency that you or your representative chose to provide the service:  Name of Home Health Agency: [facility] [phone]    [employer]:  Name of DME Company: [employer] [phone]  Equipment Ordered: rolling walker and 3:1 commode-delivered to room    The above services were set up by:  Rica Koyanagi  [facility] Health Liaison)   Phone [phone]    Additional comments: Patient is for discharge today, request home PT/OT to start tomorrow. Patient and her sister verbalized understanding.       Signed by: Rica Koyanagi  Date Time: 05/16/2014 4:47 PM

## 2014-05-16 NOTE — Plan of Care (Signed)
Problem: Pain  Goal: Patient's pain/discomfort is manageable  Outcome: Progressing  Patients pa  inmed changed to [place] with good rellief

## 2014-05-16 NOTE — OT Eval Note (Signed)
[facility]   Occupational Therapy Evaluation     Patient: [name]      MRN#: [id number]   Unit: 5NEW ORTHOPEDICS  Bed: K530/K530-01  Time of treatment:Start Time: 0950 Stop Time: 1038 Time Calculation (min): 48 min    Consult received for [name] for OT Evaluation and Treatment.  Patient's medical condition is appropriate for Occupational therapy intervention at this time.    Assessment:   Assessment: decreased independence with ADLs.   Prognosis: Good    Interdisciplinary Communication   Patient is in bed and call bell/bed alarm set within reach. Communicated with RN regarding pt's ability to participate in therapy.     Plan:   Treatment Interventions: ADL retraining;Functional transfer training;Endurance training;Patient/Family training     Discharge Recommendation: Home with supervision (initially)  DME Recommended for Discharge:  (RW, BSC)  OT Frequency Recommended: 4-5x/wk         Education:   Educated patient to role of occupational therapy, plan of care, home safety, next appropriate level of care, safety with mobility and ADLs, spinal precaution.  Demonstrated good understanding. Also discussed goals of therapy and are in agreement with plan.      Evaluation:   Precautions and Contraindications:   Precautions  Weight Bearing Status: no restrictions  Precaution Instructions Given to Patient: Yes  Back Brace Applied: yes  Spinal Precautions: no bending;no twisting;no lifting  Other Precautions: falls    Medical Diagnosis: Lumbar stenosis [M48.06]  Spondylolisthesis [M43.10]    Rehab Diagnosis: pain in joint , generalized muscle weakness, decreased coordination      History of Present Illness: [name] is a 54 y.o. female admitted on 05/14/2014 with lami fusion, L4-S1.    Patient Active Problem List   Diagnosis   . Synovial cyst   . Stenosis, spinal, lumbar          Social History:  Prior Level of Function  Prior level of function: Independent with ADLs;Ambulates  independently  Baseline Activity Level: Community ambulation  Driving: independent  Dressing - Upper Body: independent  Dressing - Lower Body: independent  Cooking: Yes  Feeding: independent  Bathing: independent  Grooming: independent  Toileting: independent  Employment: FT  Home Living Arrangements  Living Arrangements: Alone  Type of Home: House  Home Layout: Two level    Subjective:   Patient is agreeable to participation in the therapy session.      Pain Assessment  Pain Assessment: Wong-Baker FACES  Wong-Baker FACES Pain Rating: Hurts whole lot  POSS Score: Awake and Alert  Pain Location: Back  Pain Frequency: Increases with movement  Pain Intervention(s): Medication (See eMAR);Repositioned    Objective:  Inspection/Posture: WFL, scratches on her face    Patient is in bedside chair  with back brace, pain cathether, IV, hemovac in place.    Cognitive Status and Neuro Exam:  Cognition/Neuro Status  Arousal/Alertness: Appropriate responses to stimuli  Attention Span: Appears intact  Orientation Level: Oriented X4  Memory: Appears intact  Following Commands: independent  Safety Awareness: independent  Insights: Fully aware of deficits  Problem Solving: Able to problem solve independently  Behavior: anxious;cooperative  Neuro Status  Behavior: anxious;cooperative        Sensory  Auditory: intact  Vision - Complex Assessment  Ocular Range of Motion: Within Functional Limits            Musculoskeletal Examination  Gross ROM  Right Upper Extremity ROM: within functional limits  Left Upper Extremity  ROM: within functional limits  Gross Strength  Right Upper Extremity Strength: within functional limits  Left Upper Extremity Strength: within functional limits                                  Sensory/Oculomotor Examination  Sensory  Auditory: intact  Vision - Complex Assessment  Ocular Range of Motion: Within Functional Limits    Activities of Daily Living  Self-care and Home Management  Eating: Independent  Grooming:  Contact Guard Assist;standing at sink  UB Dressing: Contact Guard Assist  LB Dressing: Moderate Assist  Toileting: Minimal Assist  Functional Transfers: Minimal Assist    Functional Mobility:  Mobility and Transfers  Rolling: Moderate Assist  Sit to Supine: Moderate Assist  Functional Mobility/Ambulation: Marketing executive Sitting Balance: Independent  Dyanamic Sitting Balance: Scientific laboratory technician Standing Balance: Contact Guard Assist  Dynamic Standing Balance: Contact Guard Assist    Participation and Activity Tolerance  Participation Effort: good  Endurance: Tolerates 30 min exercise with multiple rests    Treatment:  Self Care: 10 min., toileting min A, stand at sink ADLs CGA, LB ADLs mod A  Therapeutic Activity:10 min, bed mobility mod A, rolling mod A, ambulation CGA.  :    G codes based on AM-PAC    Current:    Goal:    Discharge:        Goals:   Patient Goal: To have pain under control,  Goals  Goal Formulation: Patient  Time For Goal Achievement: 3 visits  ADL Goals  Patient will dress lower body: Modified Independent;with AE  Patient will toilet: Modified Independent  Mobility and Transfer Goals  Pt will perform functional transfers: Modified Independent                   Signature: [name] OTR/L

## 2014-05-16 NOTE — Progress Notes (Signed)
Met with the patient and Dr. Georgette Shell. She appears to be progrssing better with her PT/OT. Her pain medication is to adjusted. She will discharge to her sisters home later today. She will require Home PT, BSC and RW at discharge. Home Health liaison.

## 2014-05-16 NOTE — Discharge Summary (Signed)
Discharge Summary    Pt was admitted to undergo a lumbar laminectomy and fusion with instrumentation.    Pt did well, ambulated in the hospital with physical therapy and pain was controlled.    Pt was able to be discharged with follow up in the office.             Curl P. Deborha Moseley M.D

## 2014-05-16 NOTE — Plan of Care (Signed)
Problem: Health Promotion  Goal: Vaccination Screening  All patients will be screened for current vaccination status on each admission.   Outcome: Completed Date Met:  05/16/14  Goal: Knowledge - disease process  Extent of understanding conveyed about a specific disease process.   Outcome: Progressing  Goal: Risk control - tobacco abuse  Actions to eliminate or reduce tobacco use.   Outcome: Completed Date Met:  05/16/14  Goal: Knowledge - health resources  Extent of understanding and conveyed about healthcare resources.   Outcome: Progressing    Problem: Safety  Goal: Patient will be free from injury during hospitalization  Outcome: Progressing    Problem: Pain  Goal: Patient's pain/discomfort is manageable  Outcome: Progressing    Problem: Psychosocial and Spiritual Needs  Goal: Demonstrates ability to cope with hospitalization/illness  Outcome: Progressing    Problem: Moderate/High Fall Risk Score >/=15  Goal: Patient will remain free of falls  Outcome: Progressing  Pt's bed at lowest position. Call bell with in reach. Pt encourage to call before getting up out of bed.   Bed alarm on    Problem: Day 1 Post-op- Lumbar Discectomy/Laminectomy/Fusion  Goal: Free from Infection  Outcome: Progressing  Dressing C/D/I. Continue to monitor incision site.   Goal: Hemodynamic Stability  Outcome: Progressing  Hemovac drain intact and patent to self suction, draining serosanguinous drainage.       Goal: Mobility/activity is maintained at optimum level for patient  Outcome: Progressing  Pt denies numbness or tingling to all four extremities & neurovascularly intact.  Pt able to preform log roll turn well. Pt dangle legs at side of bed and stood and the side of bed. Pt wearing back brace when out of bed. Pt ambulated in room & sat in chair. Pt tolerated activity well. Continue to monitor pt's status and treat according to unit protocol.      Goal: Pain at adequate level as identified by patient  Outcome: Progressing  On Q pump  intact and patent. Pt's pain managed with Roxicodone prn . Continue to monitor pain and treat prn.      Goal: Patient will maintain normal GI status  Outcome: Progressing  Goal: Patient/Patient Care Companion demonstrates understanding of disease process, treatment plan, medications, and discharge plan  Outcome: Progressing  Goal: Stable Neurovascular Status  Outcome: Progressing

## 2014-05-16 NOTE — Progress Notes (Addendum)
Pt seen due to complaints of "sores" in mouth/R cheek swelling as well as headaches which she thinks may be due to the PO Oxycodone.  Pt states she has had poor tolerance to PO Percocet in the past however typically tolerates PO Norco.  Will order PO Norco for pain management.   Small canker sore appearing lesions inside buccal mucosa on R side.  Will order heating pad for R side cheek swelling.  Also ordered topical bacitracin for abrasions on face from tape

## 2014-05-16 NOTE — Plan of Care (Addendum)
Cleared for Ellis after seen by PT and Ot and was cleared by Dr Georgette Shell for [place]. Pain in control with NOrco Hemovac removed,  site CDI. Instructions for incision care discussed  With patient and sister. Patient   also able to don back brace on.

## 2014-05-16 NOTE — Progress Notes (Signed)
Patient was in a lot of pain but refuses Roxicodone as its not relieving pain and is making her have headaches, and nauseas. Dr London Sheer office called and message left. Meanwhile APS came to see patient for complaints of buccal sorerness and scar on face . JUlia. From APS came to see and gave orders for pain medicine which is Norco her drug of choice. Dr Georgette Shell came to see patient and confirmed pain mange ment.  Will Yolo hemovac and patient will be 'd this pm.After clearance from pain management.

## 2014-10-30 ENCOUNTER — Ambulatory Visit: Payer: No Typology Code available for payment source

## 2014-11-13 ENCOUNTER — Ambulatory Visit: Payer: No Typology Code available for payment source

## 2015-08-29 ENCOUNTER — Inpatient Hospital Stay
Admission: EM | Admit: 2015-08-29 | Discharge: 2015-09-08 | DRG: 871 | Disposition: A | Discharge status: Home / Self Care | Payer: No Typology Code available for payment source | Attending: Internal Medicine | Admitting: Internal Medicine

## 2015-08-29 ENCOUNTER — Inpatient Hospital Stay: Payer: No Typology Code available for payment source | Admitting: Internal Medicine

## 2015-08-29 ENCOUNTER — Emergency Department: Payer: No Typology Code available for payment source

## 2015-08-29 DIAGNOSIS — Z981 Arthrodesis status: Secondary | ICD-10-CM

## 2015-08-29 DIAGNOSIS — M419 Scoliosis, unspecified: Secondary | ICD-10-CM | POA: Diagnosis present

## 2015-08-29 DIAGNOSIS — L409 Psoriasis, unspecified: Secondary | ICD-10-CM | POA: Diagnosis present

## 2015-08-29 DIAGNOSIS — E559 Vitamin D deficiency, unspecified: Secondary | ICD-10-CM | POA: Diagnosis present

## 2015-08-29 DIAGNOSIS — J1008 Influenza due to other identified influenza virus with other specified pneumonia: Secondary | ICD-10-CM | POA: Diagnosis present

## 2015-08-29 DIAGNOSIS — E860 Dehydration: Secondary | ICD-10-CM | POA: Diagnosis present

## 2015-08-29 DIAGNOSIS — R6520 Severe sepsis without septic shock: Secondary | ICD-10-CM | POA: Diagnosis present

## 2015-08-29 DIAGNOSIS — J189 Pneumonia, unspecified organism: Secondary | ICD-10-CM | POA: Diagnosis present

## 2015-08-29 DIAGNOSIS — J9601 Acute respiratory failure with hypoxia: Secondary | ICD-10-CM | POA: Diagnosis present

## 2015-08-29 DIAGNOSIS — Z882 Allergy status to sulfonamides status: Secondary | ICD-10-CM

## 2015-08-29 DIAGNOSIS — J158 Pneumonia due to other specified bacteria: Secondary | ICD-10-CM | POA: Diagnosis present

## 2015-08-29 DIAGNOSIS — J159 Unspecified bacterial pneumonia: Secondary | ICD-10-CM | POA: Diagnosis present

## 2015-08-29 DIAGNOSIS — K219 Gastro-esophageal reflux disease without esophagitis: Secondary | ICD-10-CM | POA: Diagnosis present

## 2015-08-29 DIAGNOSIS — G43909 Migraine, unspecified, not intractable, without status migrainosus: Secondary | ICD-10-CM | POA: Diagnosis present

## 2015-08-29 DIAGNOSIS — D696 Thrombocytopenia, unspecified: Secondary | ICD-10-CM | POA: Diagnosis present

## 2015-08-29 DIAGNOSIS — A419 Sepsis, unspecified organism: Principal | ICD-10-CM | POA: Diagnosis present

## 2015-08-29 DIAGNOSIS — J101 Influenza due to other identified influenza virus with other respiratory manifestations: Secondary | ICD-10-CM

## 2015-08-29 DIAGNOSIS — E876 Hypokalemia: Secondary | ICD-10-CM | POA: Diagnosis present

## 2015-08-29 DIAGNOSIS — J45909 Unspecified asthma, uncomplicated: Secondary | ICD-10-CM | POA: Diagnosis present

## 2015-08-29 LAB — URINALYSIS, REFLEX TO MICROSCOPIC EXAM IF INDICATED
Bilirubin, UA: NEGATIVE
Blood, UA: NEGATIVE
Glucose, UA: NEGATIVE
Ketones UA: NEGATIVE
Leukocyte Esterase, UA: NEGATIVE
Nitrite, UA: NEGATIVE
Protein, UR: NEGATIVE
Specific Gravity UA: 1.01 (ref 1.001–1.035)
Urine pH: 6 (ref 5.0–8.0)
Urobilinogen, UA: NORMAL mg/dL

## 2015-08-29 LAB — CBC AND DIFFERENTIAL
Hematocrit: 40.7 % (ref 37.0–47.0)
Hgb: 13.9 g/dL (ref 12.0–16.0)
MCH: 31.5 pg (ref 28.0–32.0)
MCHC: 34.2 g/dL (ref 32.0–36.0)
MCV: 92.3 fL (ref 80.0–100.0)
MPV: 9.7 fL (ref 9.4–12.3)
Platelets: 149 10*3/uL (ref 140–400)
RBC: 4.41 10*6/uL (ref 4.20–5.40)
RDW: 13 % (ref 12–15)
WBC: 10.27 10*3/uL (ref 3.50–10.80)

## 2015-08-29 LAB — MAN DIFF ONLY
Band Neutrophils Absolute: 3.29 10*3/uL — ABNORMAL HIGH (ref 0.00–1.00)
Band Neutrophils: 32 %
Basophils Absolute Manual: 0 10*3/uL (ref 0.00–0.20)
Basophils Manual: 0 %
Eosinophils Absolute Manual: 0 10*3/uL (ref 0.00–0.70)
Eosinophils Manual: 0 %
Lymphocytes Absolute Manual: 0 10*3/uL — ABNORMAL LOW (ref 0.50–4.40)
Lymphocytes Manual: 0 %
Metamyelocytes Absolute: 0.1 10*3/uL — ABNORMAL HIGH
Metamyelocytes: 1 %
Monocytes Absolute: 0.1 10*3/uL (ref 0.00–1.20)
Monocytes Manual: 1 %
Neutrophils Absolute Manual: 6.78 10*3/uL (ref 1.80–8.10)
Nucleated RBC: 0 /100 WBC (ref 0–1)
Segmented Neutrophils: 66 %

## 2015-08-29 LAB — BASIC METABOLIC PANEL
Anion Gap: 10 (ref 5.0–15.0)
BUN: 18 mg/dL (ref 7–19)
CO2: 24 mEq/L (ref 22–29)
Calcium: 8.6 mg/dL (ref 8.5–10.5)
Chloride: 103 mEq/L (ref 100–111)
Creatinine: 1 mg/dL (ref 0.6–1.0)
Glucose: 123 mg/dL — ABNORMAL HIGH (ref 70–100)
Potassium: 3.6 mEq/L (ref 3.5–5.1)
Sodium: 137 mEq/L (ref 136–145)

## 2015-08-29 LAB — CELL MORPHOLOGY
Cell Morphology: NORMAL
Platelet Estimate: NORMAL

## 2015-08-29 LAB — TROPONIN I: Troponin I: <0.01 ng/mL (ref 0.00–0.09)

## 2015-08-29 LAB — HEPATIC FUNCTION PANEL
ALT: 29 U/L (ref 0–55)
AST (SGOT): 50 U/L — ABNORMAL HIGH (ref 5–34)
Albumin/Globulin Ratio: 1.6 (ref 0.9–2.2)
Albumin: 4.2 g/dL (ref 3.5–5.0)
Alkaline Phosphatase: 58 U/L (ref 37–106)
Bilirubin Direct: 0.2 mg/dL (ref 0.0–0.5)
Bilirubin Indirect: 0.3 mg/dL (ref 0.0–1.1)
Bilirubin, Total: 0.5 mg/dL (ref 0.2–1.2)
Globulin: 2.7 g/dL (ref 2.0–3.6)
Protein, Total: 6.9 g/dL (ref 6.0–8.3)

## 2015-08-29 LAB — GFR: EGFR: 57.6

## 2015-08-29 LAB — LACTIC ACID, PLASMA: Lactic Acid: 1 mmol/L (ref 0.2–2.0)

## 2015-08-29 LAB — B-TYPE NATRIURETIC PEPTIDE: B-Natriuretic Peptide: 80 pg/mL (ref 0–100)

## 2015-08-29 MED ORDER — IPRATROPIUM BROMIDE 0.02 % IN SOLN
1.0000 mg | Freq: Once | RESPIRATORY_TRACT | Status: AC
Start: 2015-08-29 — End: 2015-08-29
  Administered 2015-08-29: 1 mg via RESPIRATORY_TRACT
  Filled 2015-08-29: qty 5

## 2015-08-29 MED ORDER — VANCOMYCIN 1000 MG IN 250 ML NS IVPB VIAL-MATE (CNR)
1000.0000 mg | Freq: Once | INTRAVENOUS | Status: AC
Start: 2015-08-29 — End: 2015-08-29
  Administered 2015-08-29: 1000 mg via INTRAVENOUS
  Filled 2015-08-29: qty 250

## 2015-08-29 MED ORDER — SODIUM CHLORIDE 0.9 % IV SOLN
INTRAVENOUS | Status: DC
Start: 2015-08-29 — End: 2015-08-29

## 2015-08-29 MED ORDER — SODIUM CHLORIDE 0.9 % IV SOLN
INTRAVENOUS | Status: AC
Start: 2015-08-29 — End: 2015-09-01

## 2015-08-29 MED ORDER — ALBUTEROL SULFATE (2.5 MG/3ML) 0.083% IN NEBU
7.5000 mg | INHALATION_SOLUTION | Freq: Once | RESPIRATORY_TRACT | Status: AC
Start: 2015-08-29 — End: 2015-08-29
  Administered 2015-08-29: 7.5 mg via RESPIRATORY_TRACT
  Filled 2015-08-29: qty 9

## 2015-08-29 MED ORDER — ALBUTEROL-IPRATROPIUM 2.5-0.5 (3) MG/3ML IN SOLN
3.0000 mL | RESPIRATORY_TRACT | Status: AC
Start: 2015-08-29 — End: 2015-08-29
  Administered 2015-08-29 (×2): 3 mL via RESPIRATORY_TRACT
  Filled 2015-08-29 (×2): qty 3

## 2015-08-29 MED ORDER — SODIUM CHLORIDE 0.9 % IV MBP
1.0000 g | INTRAVENOUS | Status: DC
Start: 2015-08-30 — End: 2015-09-02
  Administered 2015-08-30 – 2015-09-02 (×4): 1 g via INTRAVENOUS
  Filled 2015-08-29 (×5): qty 1000

## 2015-08-29 MED ORDER — SUMATRIPTAN SUCCINATE 50 MG PO TABS
100.0000 mg | ORAL_TABLET | ORAL | Status: DC | PRN
Start: 2015-08-29 — End: 2015-09-08
  Administered 2015-08-29 – 2015-08-31 (×4): 100 mg via ORAL
  Filled 2015-08-29 (×4): qty 2

## 2015-08-29 MED ORDER — AZITHROMYCIN 500 MG IN 250 ML NS IVPB VIAL-MATE (CNR)
500.0000 mg | Freq: Once | INTRAVENOUS | Status: AC
Start: 2015-08-29 — End: 2015-08-29
  Administered 2015-08-29: 500 mg via INTRAVENOUS
  Filled 2015-08-29: qty 250

## 2015-08-29 MED ORDER — ONDANSETRON HCL 4 MG/2ML IJ SOLN
4.0000 mg | Freq: Once | INTRAMUSCULAR | Status: AC
Start: 2015-08-29 — End: 2015-08-29
  Administered 2015-08-29: 4 mg via INTRAVENOUS
  Filled 2015-08-29: qty 2

## 2015-08-29 MED ORDER — DEXAMETHASONE SODIUM PHOSPHATE 4 MG/ML IJ SOLN (WRAP)
10.0000 mg | Freq: Once | INTRAMUSCULAR | Status: AC
Start: 2015-08-29 — End: 2015-08-29
  Administered 2015-08-29: 10 mg via INTRAVENOUS
  Filled 2015-08-29: qty 3

## 2015-08-29 MED ORDER — OSELTAMIVIR PHOSPHATE 75 MG PO CAPS
75.0000 mg | ORAL_CAPSULE | Freq: Two times a day (BID) | ORAL | Status: AC
Start: 2015-08-29 — End: 2015-09-02
  Administered 2015-08-29 – 2015-09-02 (×9): 75 mg via ORAL
  Filled 2015-08-29 (×9): qty 1

## 2015-08-29 MED ORDER — ONDANSETRON HCL 4 MG/2ML IJ SOLN
4.0000 mg | Freq: Four times a day (QID) | INTRAMUSCULAR | Status: AC | PRN
Start: 2015-08-29 — End: 2015-08-29

## 2015-08-29 MED ORDER — SODIUM CHLORIDE 0.9 % IV BOLUS
30.0000 mL/kg | Freq: Once | INTRAVENOUS | Status: AC
Start: 2015-08-29 — End: 2015-08-29
  Administered 2015-08-29: 1884 mL via INTRAVENOUS

## 2015-08-29 MED ORDER — SODIUM CHLORIDE 0.9 % IV MBP
1.0000 g | Freq: Once | INTRAVENOUS | Status: AC
Start: 2015-08-29 — End: 2015-08-29
  Administered 2015-08-29: 1 g via INTRAVENOUS
  Filled 2015-08-29: qty 1000

## 2015-08-29 MED ORDER — PANTOPRAZOLE SODIUM 40 MG PO TBEC
40.0000 mg | DELAYED_RELEASE_TABLET | Freq: Two times a day (BID) | ORAL | Status: DC
Start: 2015-08-29 — End: 2015-09-05
  Administered 2015-08-29 – 2015-09-05 (×14): 40 mg via ORAL
  Filled 2015-08-29 (×14): qty 1

## 2015-08-29 MED ORDER — BENZONATATE 100 MG PO CAPS
100.0000 mg | ORAL_CAPSULE | Freq: Three times a day (TID) | ORAL | Status: DC
Start: 2015-08-29 — End: 2015-09-08
  Administered 2015-08-29 – 2015-09-08 (×30): 100 mg via ORAL
  Filled 2015-08-29 (×30): qty 1

## 2015-08-29 MED ORDER — ENOXAPARIN SODIUM 40 MG/0.4ML SC SOLN
40.0000 mg | Freq: Every day | SUBCUTANEOUS | Status: DC
Start: 2015-08-29 — End: 2015-09-08
  Administered 2015-08-29 – 2015-09-08 (×11): 40 mg via SUBCUTANEOUS
  Filled 2015-08-29 (×11): qty 0.4

## 2015-08-29 MED ORDER — ONDANSETRON HCL 4 MG/2ML IJ SOLN
4.0000 mg | Freq: Every day | INTRAMUSCULAR | Status: DC | PRN
Start: 2015-08-29 — End: 2015-08-31
  Administered 2015-08-30 – 2015-08-31 (×3): 4 mg via INTRAVENOUS
  Filled 2015-08-29 (×3): qty 2

## 2015-08-29 MED ORDER — ONDANSETRON 4 MG PO TBDP
4.0000 mg | ORAL_TABLET | Freq: Every day | ORAL | Status: DC | PRN
Start: 2015-08-29 — End: 2015-08-31

## 2015-08-29 MED ORDER — OSELTAMIVIR PHOSPHATE 75 MG PO CAPS
75.0000 mg | ORAL_CAPSULE | Freq: Once | ORAL | Status: AC
Start: 2015-08-29 — End: 2015-08-29
  Administered 2015-08-29: 75 mg via ORAL
  Filled 2015-08-29: qty 1

## 2015-08-29 MED ORDER — ACETAMINOPHEN 325 MG PO TABS
650.0000 mg | ORAL_TABLET | ORAL | Status: DC | PRN
Start: 2015-08-29 — End: 2015-09-08
  Administered 2015-08-29 – 2015-09-06 (×8): 650 mg via ORAL
  Filled 2015-08-29 (×8): qty 2

## 2015-08-29 MED ORDER — KETOROLAC TROMETHAMINE 30 MG/ML IJ SOLN
30.0000 mg | Freq: Once | INTRAMUSCULAR | Status: AC
Start: 2015-08-29 — End: 2015-08-29
  Administered 2015-08-29: 30 mg via INTRAVENOUS
  Filled 2015-08-29: qty 1

## 2015-08-29 MED ORDER — ACETAMINOPHEN 325 MG PO TABS
650.0000 mg | ORAL_TABLET | Freq: Four times a day (QID) | ORAL | Status: AC | PRN
Start: 2015-08-29 — End: 2015-08-29

## 2015-08-29 MED ORDER — IBUPROFEN 600 MG PO TABS
600.0000 mg | ORAL_TABLET | Freq: Four times a day (QID) | ORAL | Status: DC | PRN
Start: 2015-08-29 — End: 2015-08-31

## 2015-08-29 MED ORDER — SODIUM CHLORIDE 0.9 % IV SOLN
500.0000 mg | INTRAVENOUS | Status: DC
Start: 2015-08-30 — End: 2015-09-03
  Administered 2015-08-30 – 2015-09-03 (×5): 500 mg via INTRAVENOUS
  Filled 2015-08-29 (×5): qty 500

## 2015-08-29 MED ORDER — ACETAMINOPHEN 650 MG RE SUPP
650.0000 mg | RECTAL | Status: DC | PRN
Start: 2015-08-29 — End: 2015-09-08

## 2015-08-29 NOTE — ED Notes (Signed)
Pt to ED with c/o's of productive cough, chest congestion, intermittent fever and shortness of breath with rest. Symptoms x 1 week. Pt seen by PMD and no improvement with RX'd cough medication. No recent travel.

## 2015-08-29 NOTE — ED Notes (Signed)
Shock index 1.14  MD notified

## 2015-08-29 NOTE — Plan of Care (Addendum)
Problem: Safety  Goal: Patient will be free from injury during hospitalization  Outcome: Progressing  Received patient from ED via stretcher. Patient is alert and oriented. V/S stable. Falls precautions maintained. Vancomycin infusing. Cal bell within reach. All other needs attended.     Problem: Inadequate Gas Exchange  Goal: Adequately oxygenating and ventilation is improved  Outcome: Progressing  Patient came in due to shortness of breath with productive cough. Patient states she feels pain with cough episodes, had sore throat last week. Patient is now on 3L NC with SPO2 of 95-96%. Needs O2 when ambulating to the bathroom, tech advised. Will monitor accordingly.

## 2015-08-29 NOTE — ED Provider Notes (Signed)
Physician/Midlevel provider first contact with patient: 08/29/15 0818         History     Chief Complaint   Patient presents with   . Shortness of Breath   . Cough       Historian: Patient    55 y.o. female nonsmoker p/w worsening cough, which is intermittently productive of sputum, for the past 2 weeks.  Associated w/ fever (Tmax = 102.6 at home) since last PM, chest congestion, and SOB. Pt reports she was seen at her PMD's and rx'ed benzonatate 2 days ago; however, her sxs have worsened significantly since then. Has taken 2x Aleve w/ some relief this AM.  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 No hemoptysis.          PMD: Pcp, [facility], MD [facility])        Past Medical History   Diagnosis Date   . PONV (postoperative nausea and vomiting)    . GERD (gastroesophageal reflux disease)    . Arthritis      THUMB   . Rash      PSORIASIS   . Headache(784.0)      MIGRAINES, headache today   . Low back pain      spinal stenosis, right sided pain, no neuro deficits   . 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 Varicose veins      [facility]   . Scoliosis      slight curvature-noted by orthopedist a many years ago   . Anemia      with pregnancy       Past Surgical History   Procedure Laterality Date   . Bunionectomy  2003   . Esophagogastroduodenoscopy       X3   . Colonoscopy  02/2011   . Thyroid surgery  age 76     benign mass removed   . Arthrodesis  Feb 2013     Left foot   . Breast surgery  1993     right benign hardened cyst   . Laminectomy, lumbar, removal hnp, level 1  02/28/2013     Procedure: LAMINECTOMY, LUMBAR, REMOVAL HNP, LEVEL 1;  Surgeon: Marnee Guarneri, MD;  Location: [place] [facility];  Service: Neurosurgery;  Laterality: Right;  RIGHT L5-S1 LUMBAR FORAMINOTOMY W/EXCISION OF SYNOVIAL CYST   . Back surgery  Nov. 2014     nerve  opening   . Skin biopsy       various times-brown spots removed by dermatologist   . Spine surgery  Nov. 2014     nerve opening   . Laminectomy, posterior lumbar, decomp, fusion, level 2 N/A 05/14/2014     Procedure: LAMINECTOMY, POSTERIOR LUMBAR, DECOMP, FUSION, LEVEL 2;  Surgeon: Tharon Aquas, MD;  Location: [place] [facility];  Service: Orthopedics;  Laterality: N/A;  LAMINECTOMY AND FUSION L4-S1,BMP-2         History reviewed. No pertinent family history.    Social  Social History   Substance Use Topics   . Smoking status: Never Smoker    . Smokeless tobacco: Never Used      Comment: n/a   . Alcohol Use: 1.8 oz/week     4 Glasses of wine, 0 Cans of beer, 0 Shots of liquor, 0 Standard drinks or equivalent  per week      Comment: occasionally a mixed drink       .     Allergies   Allergen Reactions   . Sulfa Antibiotics Rash and Fever   . Percocet [Oxycodone-Acetaminophen] Other (See Comments)     Confusion   . Tramadol-Acetaminophen Nausea Only and Other (See Comments)   . Tramadol Hcl Er Itching, Nausea And Vomiting, Other (See Comments) and Rash       Home Medications     Last Medication Reconciliation Action:  In Progress [name], RN 08/29/2015  8:30 AM                  calcipotriene-betamethasone (TACLONEX) ointment     Apply topically as needed.        CALCIUM PO     Take 1 tablet by mouth daily. CHEWABLE     desonide (DESOWEN) 0.05 % cream     Apply topically.        dexlansoprazole 60 MG capsule     Take 60 mg by mouth every morning.      HYDROcodone-acetaminophen (NORCO) 5-325 MG per tablet     Take 2 tablets by mouth every 4 (four) hours as needed.     methocarbamol (ROBAXIN) 750 MG tablet     Take 1 tablet (750 mg total) by mouth 3 (three) times daily as needed.     Multiple Vitamins-Minerals (MULTIVITAMIN PO)     Take 1 tablet by mouth daily.       oxyCODONE (OXYCONTIN) 10 MG 12 hr tablet     Take 1 tablet (10 mg total) by mouth every 12 (twelve) hours.     SUMAtriptan (IMITREX)  100 MG tablet     Take 100 mg by mouth as needed.       SUMAtriptan Succinate (IMITREX IJ)     Inject 0.5 mg as directed as needed.      Vitamin D3 (CHOLECALCIFEROL) 5000 UNITS Cap capsule                Review of Systems   Constitutional: + fever  ENT: + chest congestion  Resp: + cough, + SOB, No hemoptysis   All other systems reviewed and negative      Physical Exam    BP: 104/60 mmHg, Heart Rate: (!) 109, Temp: 99.4 F (37.4 C), Resp Rate: (!) 24, SpO2: 92 %, Weight: 62.8 kg     Physical Exam   Constitutional: She is oriented to person, place, and time. She appears well-developed and well-nourished.   Cooperative, speaking softly and coughing frequently with some wetness to said cough.  Deeper breathing seems to elicit cough and discomfort.  Pt overall looks to feel unwell.   HENT:   Head: Normocephalic and atraumatic.   Mouth/Throat: Mucous membranes are dry.   Eyes: Conjunctivae and lids are normal.   Neck: Phonation normal. Neck supple.   Cardiovascular: Regular rhythm, S1 normal and S2 normal.  Tachycardia present.    Pulmonary/Chest: No stridor. Tachypnea noted. She has decreased breath sounds.   Abdominal: Soft. There is no tenderness.   Musculoskeletal: Normal range of motion.   Neurological: She is alert and oriented to person, place, and time. GCS eye subscore is 4. GCS verbal subscore is 5. GCS motor subscore is 6.   Skin: Skin is warm and dry.   Nursing note and vitals reviewed.        MDM and ED Course     ED Medication  Orders     Start Ordered     Status Ordering Provider    08/29/15 0919 08/29/15 0918  ondansetron (ZOFRAN) injection 4 mg   Once     Route: Intravenous  Ordered Dose: 4 mg     Ordered Izaha Shughart C    08/29/15 0919 08/29/15 0918  vancomycin (VANCOCIN) 1000 mg in sodium chloride 0.9% 250 mL IVPB   Once     Route: Intravenous  Ordered Dose: 1,000 mg     Athena Masse    08/29/15 0919 08/29/15 0918  oseltamivir (TAMIFLU) capsule 75 mg   Once     Route: Oral  Ordered Dose:  75 mg     Last MAR action:  Given Yamilka Lopiccolo C    08/29/15 0848 08/29/15 0847  cefTRIAXone (ROCEPHIN) 1 g in sodium chloride 0.9 % 100 mL IVPB mini-bag plus   Once     Route: Intravenous  Ordered Dose: 1 g     Last MAR action:  New Bag Kayliah Tindol C    08/29/15 0848 08/29/15 0847  azithromycin (ZITHROMAX) 500 mg in sodium chloride 0.9% 500 mg   Once     Route: Intravenous  Ordered Dose: 500 mg     Athena Masse    08/29/15 1610 08/29/15 0831  sodium chloride 0.9 % bolus 1,884 mL   Once     Route: Intravenous  Ordered Dose: 30 mL/kg     Last MAR action:  New Bag Forest Gleason    08/29/15 0827 08/29/15 0826  albuterol (PROVENTIL) nebulizer solution 7.5 mg   RT - Once     Route: Nebulization  Ordered Dose: 7.5 mg     Last MAR action:  Given Saniyyah Elster C    08/29/15 0827 08/29/15 0826  ipratropium (ATROVENT) 0.02 % nebulizer solution 1 mg   RT - Once     Route: Nebulization  Ordered Dose: 1 mg     Last MAR action:  Given Cerina Leary C    08/29/15 0827 08/29/15 0826  dexamethasone (DECADRON) injection 10 mg   Once     Route: Intravenous  Ordered Dose: 10 mg     Last MAR action:  Given Ell Tiso C             MDM    Home Medications      Home medications reviewed by ED MD     Previous Medications    CALCIPOTRIENE-BETAMETHASONE (TACLONEX) OINTMENT    Apply topically as needed.       CALCIUM PO    Take 1 tablet by mouth daily. CHEWABLE    DESONIDE (DESOWEN) 0.05 % CREAM    Apply topically.       DEXLANSOPRAZOLE 60 MG CAPSULE    Take 60 mg by mouth every morning.     HYDROCODONE-ACETAMINOPHEN (NORCO) 5-325 MG PER TABLET    Take 2 tablets by mouth every 4 (four) hours as needed.    METHOCARBAMOL (ROBAXIN) 750 MG TABLET    Take 1 tablet (750 mg total) by mouth 3 (three) times daily as needed.    MULTIPLE VITAMINS-MINERALS (MULTIVITAMIN PO)    Take 1 tablet by mouth daily.      OXYCODONE (OXYCONTIN) 10 MG 12 HR TABLET    Take 1 tablet (10 mg total) by mouth every 12 (twelve) hours.     SUMATRIPTAN (IMITREX) 100 MG TABLET    Take 100 mg by mouth as needed.  SUMATRIPTAN SUCCINATE (IMITREX IJ)    Inject 0.5 mg as directed as needed.     VITAMIN D3 (CHOLECALCIFEROL) 5000 UNITS CAP CAPSULE           Nurses notes including past history, allergies, vital signs reviewed.  Past Medical History   Diagnosis Date   . PONV (postoperative nausea and vomiting)    . GERD (gastroesophageal reflux disease)    . Arthritis      THUMB   . Rash      PSORIASIS   . Headache(784.0)      MIGRAINES, headache today   . Low back pain      spinal stenosis, right sided pain, no neuro deficits   . 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 Varicose veins      [facility]   . Scoliosis      slight curvature-noted by orthopedist a many years ago   . Anemia      with pregnancy     Past Surgical History   Procedure Laterality Date   . Bunionectomy  2003   . Esophagogastroduodenoscopy       X3   . Colonoscopy  02/2011   . Thyroid surgery  age 5     benign mass removed   . Arthrodesis  Feb 2013     Left foot   . Breast surgery  1993     right benign hardened cyst   . Laminectomy, lumbar, removal hnp, level 1  02/28/2013     Procedure: LAMINECTOMY, LUMBAR, REMOVAL HNP, LEVEL 1;  Surgeon: Marnee Guarneri, MD;  Location: [place] [facility];  Service: Neurosurgery;  Laterality: Right;  RIGHT L5-S1 LUMBAR FORAMINOTOMY W/EXCISION OF SYNOVIAL CYST   . Back surgery  Nov. 2014     nerve opening   . Skin biopsy       various times-brown spots removed by dermatologist   . Spine surgery  Nov. 2014     nerve opening   . Laminectomy, posterior lumbar, decomp, fusion, level 2 N/A 05/14/2014     Procedure: LAMINECTOMY, POSTERIOR LUMBAR, DECOMP, FUSION, LEVEL 2;  Surgeon: Tharon Aquas, MD;  Location: [place] [facility];  Service: Orthopedics;  Laterality: N/A;  LAMINECTOMY AND FUSION L4-S1,BMP-2       Allergies   Allergen Reactions   . Sulfa Antibiotics Rash and Fever   . Percocet  [Oxycodone-Acetaminophen] Other (See Comments)     Confusion   . Tramadol-Acetaminophen Nausea Only and Other (See Comments)   . Tramadol Hcl Er Itching, Nausea And Vomiting, Other (See Comments) and Rash       BP 115/61 mmHg  Pulse 123  Temp(Src) 99.4 F (37.4 C) (Oral)  Resp 27  Ht 5\' 7"  (1.702 m)  Wt 62.8 kg  BMI 21.68 kg/m2  SpO2 98%  LMP 09/18/2010    O2 Sat in ED is 92% which is borderline.  Pt is being observed on continuous pulsox.    Pt with wet cough and some bronchospasticity by clinical presentation.  Could be all flu/viral though progression to pneumonia and sepsis is of concern.  Will check sepsis work-up and support with IVF, nebulizer, and steroids.  Add in Abx as indicated by results.  Monitor in ED.  May need admission.    Patient [name] was seen and treated by me in the ED.   Forest Gleason, MD  8:16  AM    Scribe and MD Attestations     I, Young Williemae Area, am serving as a scribe to document services personally performed by Forest Gleason, MD, based on the provider's statements to me.    IForest Gleason, MD, personally performed the services documented. Young Joon Buzzy Han is scribing for me on [facility] ANN. I reviewed and confirm the accuracy of the information in this medical record.     Rendering Provider: Forest Gleason, MD    EKG, Monitoring, and Splints     EKG (interpreted by ED physician): Sinus tachycardia with rate at 110 bpm. Normal intervals, axis, and conduction. No ST-T wave abnormality.       Diagnostic Study Results     The results of the diagnostic studies below were reviewed by the ED provider:    Results     Procedure Component Value Units Date/Time    Troponin I [[id number]] Collected:  08/29/15 0853    Specimen Information:  Blood Updated:  08/29/15 0923     Troponin I <0.01 ng/mL     Basic Metabolic Panel [[id number]]  (Abnormal) Collected:  08/29/15 0853    Specimen Information:  Blood Updated:  08/29/15 0918     Glucose 123 (H)  mg/dL      BUN 18 mg/dL      Creatinine 1.0 mg/dL      Calcium 8.6 mg/dL      Sodium 782 mEq/L      Potassium 3.6 mEq/L      Chloride 103 mEq/L      CO2 24 mEq/L      Anion Gap 10.0     Hepatic function panel (LFT) [[id number]]  (Abnormal) Collected:  08/29/15 0853    Specimen Information:  Blood Updated:  08/29/15 0918     Bilirubin, Total 0.5 mg/dL      Bilirubin, Direct 0.2 mg/dL      Bilirubin, Indirect 0.3 mg/dL      AST (SGOT) 50 (H) U/L      ALT 29 U/L      Alkaline Phosphatase 58 U/L      Protein, Total 6.9 g/dL      Albumin 4.2 g/dL      Globulin 2.7 g/dL      Albumin/Globulin Ratio 1.6     GFR [[id number]] Collected:  08/29/15 0853     EGFR 57.6 Updated:  08/29/15 0918    Rapid influenza A/B antigens [[id number]] Collected:  08/29/15 0853    Specimen Information:  Nasopharyngeal from Nasal Aspirate Updated:  08/29/15 0913    Narrative:      ORDER#: [id number]                                    ORDERED BY: Cari Caraway  SOURCE: Nasal Aspirate                               COLLECTED:  08/29/15 08:53  ANTIBIOTICS AT COLL.:                                RECEIVED :  08/29/15 08:59  Influenza Rapid Antigen A&B                FINAL       08/29/15 09:13   +  08/29/15   Positive for Influenza B and Negative for Influenza A             Reference Range: Negative      Lactic acid [[id number]] Collected:  08/29/15 0851    Specimen Information:  Blood Updated:  08/29/15 0907     Lactic acid 1.0 mmol/L     CBC and differential [[id number]] Collected:  08/29/15 0853    Specimen Information:  Blood from Blood Updated:  08/29/15 0902     WBC 10.27 x10 3/uL      Hgb 13.9 g/dL      Hematocrit 29.5 %      Platelets 149 x10 3/uL      RBC 4.41 x10 6/uL      MCV 92.3 fL      MCH 31.5 pg      MCHC 34.2 g/dL      RDW 13 %      MPV 9.7 fL     Blood culture #2  (Aerobic / Anaerobic) [[id number]] Collected:  08/29/15 0859    Specimen Information:  Arm from Blood Updated:  08/29/15 0859    Narrative:      1 BLUE+1 PURPLE    B-type  Natriuretic Peptide [[id number]] Collected:  08/29/15 0853    Specimen Information:  Blood Updated:  08/29/15 0859    Blood culture #1 (Aerobic / Anaerobic) [[id number]] Collected:  08/29/15 0851    Specimen Information:  Arm from Blood Updated:  08/29/15 0851    Narrative:      1 BLUE+1 PURPLE          Radiology Results (24 Hour)     Procedure Component Value Units Date/Time    Chest AP Portable [[id number]] Collected:  08/29/15 0840    Order Status:  Completed Updated:  08/29/15 0845    Narrative:      HISTORY: Sepsis    COMPARISON: 04/23/2014    Portable AP view of the chest shows patchy infiltrate in the right upper  lobe concerning for pneumonia. Cardiomediastinal silhouette is not  enlarged.  No focal bony lesion is seen.      Impression:          Right upper lobe infiltrate concerning for pneumonia    J. Carole Binning, MD   08/29/2015 8:41 AM            Clinical Notes       8:47 AM   Advised pt of xray suggesting PNA. Will add abx for PNA. Informed her of high likelihood for need of admission, though awaiting further results and to see how she does clinically     9:20 AM   Informed pt and visitor of positive influenza B.  Will still treat with abx for the moment (concern for secondary bacterial PNA).  Will treat her with tamiflu, though uncertain what the benefit would be. She notes she got the flu shot. I suggested that if she hadn't, she could be even sicker now.  Will need admission    9:28 AM  D/w Dr. Patsy Lager (Hospitalist) who will admit to telemetry inpatient.         Critical Care     Critical Care Time: 40 mins excluding procedures with high imminent risk for threat to life or limb with the following systems at risk:  Respiratory distress  Activities of critical care included:  Discussion with patient/family, Review of results in the ED, Bedside evaluation and direction of care, High risk medication administration or  other resuscitation, Respiratory support and Close observation and re-evaluations    Heart  Score         Value    History  0    EKG  0    Risk Factors  0    Total (with age)  1    Onset of pain (time of START of last episode of chest pain)?  >6 hrs ago    Timing of repeat Troponin/EKG order  only 1 troponin/EKG required          Procedures    Clinical Impression & Disposition     Clinical Impression  Final diagnoses:   Pneumonia of right upper lobe due to infectious organism   Sepsis, due to unspecified organism   Influenza B        ED Disposition     Admit Admitting Physician: Loleta Dicker MARIE [[id number]]  Diagnosis: Pneumonia [227785]  Estimated Length of Stay: > or = to 2 midnights  Tentative Discharge Plan?: Home or Self Care [1]  Patient Class: Inpatient [101]  Bed request comments: Dx: PNA, Sepsis, Influezna B - tele inpatient             New Prescriptions    No medications on file        Treatment Team: Scribe: Callie Fielding, MD  08/29/15 [phone]

## 2015-08-30 LAB — CBC AND DIFFERENTIAL
Hematocrit: 34.4 % — ABNORMAL LOW (ref 37.0–47.0)
Hgb: 11.4 g/dL — ABNORMAL LOW (ref 12.0–16.0)
MCH: 31.1 pg (ref 28.0–32.0)
MCHC: 33.1 g/dL (ref 32.0–36.0)
MCV: 94 fL (ref 80.0–100.0)
MPV: 10.1 fL (ref 9.4–12.3)
Platelets: 134 10*3/uL — ABNORMAL LOW (ref 140–400)
RBC: 3.66 10*6/uL — ABNORMAL LOW (ref 4.20–5.40)
RDW: 14 % (ref 12–15)
WBC: 10.16 10*3/uL (ref 3.50–10.80)

## 2015-08-30 LAB — ECG 12-LEAD
Atrial Rate: 109 {beats}/min
P Axis: 50 degrees
P-R Interval: 122 ms
Q-T Interval: 336 ms
QRS Duration: 92 ms
QTC Calculation (Bezet): 452 ms
R Axis: 32 degrees
T Axis: 55 degrees
Ventricular Rate: 109 {beats}/min

## 2015-08-30 LAB — GFR: EGFR: >60

## 2015-08-30 LAB — BASIC METABOLIC PANEL
Anion Gap: 4 — ABNORMAL LOW (ref 5.0–15.0)
BUN: 12 mg/dL (ref 7–19)
CO2: 24 mEq/L (ref 22–29)
Calcium: 7.3 mg/dL — ABNORMAL LOW (ref 8.5–10.5)
Chloride: 113 mEq/L — ABNORMAL HIGH (ref 100–111)
Creatinine: 0.8 mg/dL (ref 0.6–1.0)
Glucose: 105 mg/dL — ABNORMAL HIGH (ref 70–100)
Potassium: 3.8 mEq/L (ref 3.5–5.1)
Sodium: 141 mEq/L (ref 136–145)

## 2015-08-30 LAB — MAN DIFF ONLY
Band Neutrophils Absolute: 2.74 10*3/uL — ABNORMAL HIGH (ref 0.00–1.00)
Band Neutrophils: 27 %
Basophils Absolute Manual: 0 10*3/uL (ref 0.00–0.20)
Basophils Manual: 0 %
Eosinophils Absolute Manual: 0 10*3/uL (ref 0.00–0.70)
Eosinophils Manual: 0 %
Lymphocytes Absolute Manual: 0.2 10*3/uL — ABNORMAL LOW (ref 0.50–4.40)
Lymphocytes Manual: 2 %
Monocytes Absolute: 0.1 10*3/uL (ref 0.00–1.20)
Monocytes Manual: 1 %
Neutrophils Absolute Manual: 7.11 10*3/uL (ref 1.80–8.10)
Nucleated RBC: 0 /100 WBC (ref 0–1)
Segmented Neutrophils: 70 %

## 2015-08-30 LAB — CELL MORPHOLOGY
Cell Morphology: NORMAL
Platelet Estimate: DECREASED — AB

## 2015-08-30 MED ORDER — DEXTROMETHORPHAN POLISTIREX ER 30 MG/5ML PO SUER
30.0000 mg | Freq: Two times a day (BID) | ORAL | Status: DC
Start: 2015-08-30 — End: 2015-09-08
  Administered 2015-08-30 – 2015-09-08 (×19): 30 mg via ORAL
  Filled 2015-08-30 (×20): qty 5

## 2015-08-30 MED ORDER — DEXTROMETHORPHAN POLISTIREX ER 30 MG/5ML PO SUER
30.0000 mg | Freq: Two times a day (BID) | ORAL | Status: DC
Start: 2015-08-30 — End: 2015-08-30
  Filled 2015-08-30 (×2): qty 5

## 2015-08-30 NOTE — H&P (Signed)
Patient Type: I     ATTENDING PHYSICIAN: Loleta Dicker, MD     PRIMARY CARE PHYSICIAN:  The patient follows at [facility].     CHIEF COMPLAINT:  Fevers with productive cough.     HISTORY OF PRESENT ILLNESS:  Ms. [name] is a 55 year old female with past medical history significant  for GERD with history of chronic low back pain with spinal stenosis status  post lumbar fusion and history of a partial thyroidectomy, who presented to  the ED earlier today due to progressively worsening productive cough with  fever and associated chills.  The patient states that her symptoms started  approximately 8 days ago, at which time she had severe sore throat and  myalgias noted.  The patient initially felt the symptoms were due to a  viral syndrome and did not seek medical care at that time.  She was seen in  her PCP's office approximately 2 days ago, at which time she had a throat  culture performed and Rapid Strep was noted to be negative.  The patient  was provided a prescription for Jerilynn Som from PCP office and  instructed to continue supportive care.  The patient over the past 24 hours  had significant worsening in symptoms with T-max of 102.6 at home with  ongoing chills and worsening shortness of breath.  The patient presented to  the ED earlier today and also noting new onset nausea, mild headaches and  poor p.o. intake.  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  She lives alone and has no other known  sick contacts.  She has not had any recent travel.  She denies any risk  factors for HIV.     PAST MEDICAL HISTORY:  1.  History of gastroesophageal reflux disease.  2.  History of chronic low back pain with history of spinal stenosis status  post L4-L5-S1 fusion in January 2016.  3.  History of partial thyroidectomy.  4.  Psoriasis.  5.  History of bunionectomy.  6.  History of  breast biopsy.  7.  Vitamin D deficiency.     CURRENT MEDICATIONS:  1.  Dexilant 60 mg p.o. daily.  2.  Skelaxin as needed.  3.  Imitrex as needed.  4.  Vitamin D supplement daily.  5.  Calcium supplement daily.  6.  Multivitamin 1 tablet p.o. daily.     ALLERGIES:  The patient states that she is allergic to SULFA ANTIBIOTICS, also notes  intolerance to PERCOCET and ULTRAM.     SOCIAL HISTORY:  The patient is divorced, currently lives alone.  She is employed in the  [employer].  Denies any history of tobacco use, states that she  drinks wine 3 days per week.  No history of illicit drug use.  The patient  is full code.     FAMILY HISTORY:  She reports history of hypertension and CAD.     REVIEW OF SYSTEMS:  Twelve systems were reviewed.  Pertinent positives per the HPI, which  include recent recurrent fevers, chills episode of myalgias, sore throat,  rhinorrhea, productive cough, nausea, mild headaches and decreased appetite  over the past 8 days, progressively worsening in the last 24 hours.  She  denies any neck stiffness, no photophobia or phonophobia.  She has noted  that her sore throat has improved in the last  few days.  Denies any  odynophagia.  No chest pain, however, does report ongoing chest tightness.   States cough is productive with yellow sputum.  Denies any hemoptysis.  No  episodes of emesis; however, intractable nausea as noted.  No changes in  bowels.  No recent diarrhea.  No dysuria or changes in urinary frequency.   No new rashes or skin lesions.  She has had a report of bilateral knee  discomfort.  No other joint pains.  All other systems reviewed and were  negative.     PHYSICAL EXAMINATION:  VITAL SIGNS:  Blood pressure 102/54, heart rate 117, respiratory rate 17,  oxygen saturations 95% on 3 liters nasal cannula, temperature current 98.3.  GENERAL:  The patient is lethargic appearing.  HEENT:  PERRLA, extraocular muscles are intact, clear conjunctivae.  Dry  mucous membranes.   Oropharynx mild erythema, no exudates.  NECK:  Supple, no JVD, no palpable lymphadenopathy.  CARDIOVASCULAR:  Tachycardic, regular, no murmurs.  LUNGS:  Diffuse mild expiratory wheezing with right greater than left  rhonchi noted.  No rales.  ABDOMEN:  Soft, nontender, nondistended, normoactive bowel sounds.  EXTREMITIES:  No cyanosis, clubbing or edema.  NEUROLOGIC:  Cranial nerves II-XII are grossly intact.  She moves all  extremities.  No gross focal neurologic deficit appreciated on exam.  SKIN:  No visible rashes, lesions.  PSYCHIATRIC:  The patient is awake, alert and oriented x3, displays  appropriate affect, cooperative with exam.     LABORATORY AND DIAGNOSTIC DATA:  WBC is 10.27 with 32% bands, hemoglobin is 13.9, hematocrit 40.7, platelets  149, MCV is 92.3, sodium 137, potassium 3.6, chloride 103, bicarbonate 24,  BUN 18, creatinine 1.0,  glucose 123, calcium is 8.6, AST 50, ALT 29,  alkaline phosphatase 58, total bilirubin 0.5.  Troponin less than 0.01.   BNP is 80.  Lactic acid 1.0.  Urinalysis unremarkable.  Blood cultures  pending from the ED.  Urine culture pending.  Rapid influenza screen  positive for influenza B.  Chest x-ray noting findings of right upper lobe  infiltrate concerning for pneumonia.     ASSESSMENT AND PLAN:  The patient is a 55 year old female with past medical history significant  for gastroesophageal reflux disease, history of chronic low back pain due  to spinal stenosis status post lumbar fusion and history of partial  thyroidectomy, who presented to the ED earlier today secondary to  progressively worsening fevers, chills, productive cough, and recent  myalgias and rhinorrhea.    1.  Sepsis in the setting of influenza B as well as pneumonia.  The patient  given significant bandemia and concern for possible superimposed bacterial  pneumonia, has been started on antibiotics with community-acquired  coverage.  She will be admitted as an inpatient, anticipate this patient  will  require greater than 2 midnights.  We will follow blood cultures  closely.  We will continue aggressive hydration and monitor heart rate  closely.  The patient has been started on Tamiflu at this time.  2.  Influenza B with pneumonia.  We will continue Tamiflu and complete  5-day course.  3.  Pneumonia, probable due to influenza.  However, concern for possible  superimposed bacterial.  We will continue community-acquired pneumonia  coverage with azithromycin and Rocephin.  We will obtain sputum culture.   We will check urine antigens for Streptococcus and Legionella.  Given  ongoing persistent coughing we will schedule Tessalon as well as administer  Tussionex as needed.  4.  Bandemia.  We will monitor cultures closely.  Continue antibiotics.  5.  Sinus tachycardia, probable due to sepsis in the setting of pneumonia  with influenza.  We will continue aggressive hydration as patient also  appears clinically dehydrated.  Monitor closely on telemetry.  6.  Acute hypoxic respiratory failure.  The patient initially requiring 4  liters of nasal cannula for appropriate oxygen saturations in the emergency  department.  We will continue to titrate to supplement as tolerated.  7.  Gastrointestinal prophylaxis.  Proton pump inhibitor.  8.  Deep venous thrombosis prophylaxis. Lovenox.  9.  Code status.  Full code.           D:  08/29/2015 18:11 PM by Dr. Ramond Dial, MD ([id number])  T:  08/29/2015 19:12 PM by NTS      (Conf: [id number]) (Doc ID: [id number])

## 2015-08-30 NOTE — Progress Notes (Signed)
Nutritional Support Services  Nutrition Assessment    [name] 55 y.o. female   MRN: [id number]      Reason for Assessment:  Admit RN Nutrition Risk Referral --> Unplanned Wt Loss and Depressed Appetite    Nutrition Summary:  Hospital day #2 for 55 yo female admit for cc of fever, chills & increased cough.  Pt noted usual wt which is very close to present wt value and reports depressed appetite only due to increase in nausea and headache PTA but normally eats well.  Pt data is not significant for nutrition risk at this time.    Nutrition Diagnosis:  Pt without active nutrition dx based upon available data      Interventions:  1. Obtained diet and wt hx  2. Encouraged pt to order meals  3.   Request weekly wt     Goal:  Pt to be able to maintain reported usual wt with po trends of 75% or greater.    Assessment Data:  Adm dx:  Productive Cough  Patient Active Problem List   Diagnosis   . Synovial cyst   . Stenosis, spinal, lumbar   . Pneumonia       PMH:  has a past medical history of PONV (postoperative nausea and vomiting); GERD (gastroesophageal reflux disease); Arthritis; Rash; Headache(784.0); Low back pain; Neuropathy; Varicose veins; Scoliosis; and Anemia.      Recent Labs  Lab 08/30/15  0634 08/29/15  0853   SODIUM 141 137   POTASSIUM 3.8 3.6   CHLORIDE 113* 103   CO2 24 24   BUN 12 18   CREATININE 0.8 1.0   GLUCOSE 105* 123*   CALCIUM 7.3* 8.6       Pertinent meds:  Current Facility-Administered Medications   Medication Dose Route Frequency   . cefTRIAXone  1 g Intravenous Q24H SCH    And   . azithromycin  500 mg Intravenous Q24H SCH   . benzonatate  100 mg Oral TID   . dextromethorphan polistirex ER  30 mg Oral Q12H SCH   . enoxaparin  40 mg Subcutaneous Daily   . oseltamivir  75 mg Oral Q12H SCH   . pantoprazole  40 mg Oral BID AC     . sodium chloride 75 mL/hr at 08/30/15 1201       Social History:  Works for [employer]    Diet History:  Pt reports she normally weighs 140#, no wt  loss noted as compared to current system wt.  Pt notes to usually consume 3 meals daily, breakfast is a complete meal of cereal with toast or bagel, juice and coffee or egg entree. Lunch is meal bought from home typically a chef salad with seasonal fruit and dinner is typically a meat entree with seasonal vegetable with either rice/pasta/potato.  Pt is an independent self feeder.      Current Diet Order  Diet regular    Food intake:  To be established    Learning Needs: None identified    Anthropometrics  Height: 170.2 cm (5\' 7" )  Weight: 64.864 kg (143 lb)  Weight Change: 3.29  IBW/kg (Calculated) Female: 67.29 kg  IBW/kg (Calculated) Female: 61.35 kg  BMI (calculated): 22.4      Monitoring/Evaluation:   -Will monitor % PO trends, weight, and clinical course.   Will f/u per Nutrition Services LOS and [facility] policy, available per consult order      Marlis Edelson, MS, RD, CNSC  Ext. [phone]

## 2015-08-30 NOTE — Plan of Care (Signed)
Problem: Safety  Goal: Patient will be free from injury during hospitalization  Outcome: Progressing  A&Ox4, VSS, tele shows SR. Patient is able to ambulate independently. Hourly rounding conducted. All needs met.    Problem: Pain  Goal: Patient's pain/discomfort is manageable  Outcome: Progressing  Patient c/o pain in chest & headache from coughing. Administered Tylenol for pain relief with good result.     Problem: Inadequate Gas Exchange  Goal: Adequately oxygenating and ventilation is improved  Outcome: Progressing  Patient continues on 3L NC. Strong, painful cough and DOE. Reports relief with Delsym medication. Will continue to monitor.

## 2015-08-30 NOTE — Plan of Care (Signed)
Problem: Safety  Goal: Patient will be free from injury during hospitalization  Outcome: Progressing  A&Ox4, VSS, tele shows ST. Patient is independent and up as needed. Call bell & bedside table are within reach. Hourly rounding conducted. All needs met.    Problem: Inadequate Gas Exchange  Goal: Adequately oxygenating and ventilation is improved  Outcome: Progressing  Patient c/o DOE, on 3L nasal cannula, O2 sats 90-95%. Experiences pain when coughing, administered scheduled tessalon pearls.

## 2015-08-30 NOTE — Plan of Care (Signed)
Problem: Safety  Goal: Patient will be free from injury during hospitalization  Outcome: Progressing  Call bell within reach. Bed at the lowest position. Pt ambulates independently with stable gait. Hourly rounding done. No s/s of distress noted.     Problem: Inadequate Gas Exchange  Goal: Adequately oxygenating and ventilation is improved  Outcome: Progressing  Pt c/o coughing. Pt remains on 3L oxygen. Will continue to monitor.

## 2015-08-30 NOTE — Progress Notes (Signed)
Patient: [name]  Date: 08/30/2015   LOS: 1 Days  Admission Date: 08/29/2015   MRN: [id number]  Attending: Norton Pastel MD     ASSESSMENT/PLAN     [name] is a 55 y.o. female admitted with Fever/cough    Assessment and Plan:    1. Sepsis from influenza B and probably superimposed Bacterial pneumonia.   Continue IV ABX covering for CAP  Continue Tamiflu as well but her symptoms already are of ~10 days duration   follow blood cultures  Decrease IVF as no tachycardia now  Add cough suppressant Dextromethorphan  If still not felling better by AM will consider ID eval    2. Hypoxemic resp failure sec to #1  Continue O2 supplement  No wheezing    3. Mild thrombocytopenia probably from Infection  Monitor daily CBC    4. GERD on PPI    Analgesia: Tylenol    Nutrition: Regular Diet    GI Prophylaxis: PPI    DVT Prophylaxis: Lovenox 40 mg subQ daily       Code Status: full code    DISPO:   Continue IV ABX  Add Dextromethorphan  If not improving by AM will get ID involvement    Safety Checklist  Foley: Not present   IVs:  Peripheral IV   PT/OT: Not needed   Daily CBC & or Chem ordered:  SHM/ABIM guidelines (see #5) Yes                 SUBJECTIVE     [name] states she feels may be little better  Still with episodes of intense coughing  One time some blood tinge phlegm but mostly dry  No fever spike since yesterday evening  No N/v/D    MEDICATIONS     Current Facility-Administered Medications   Medication Dose Route Frequency   . cefTRIAXone  1 g Intravenous Q24H SCH    And   . azithromycin  500 mg Intravenous Q24H SCH   . benzonatate  100 mg Oral TID   . dextromethorphan polistirex ER  30 mg Oral Q12H SCH   . enoxaparin  40 mg Subcutaneous Daily   . oseltamivir  75 mg Oral Q12H SCH   . pantoprazole  40 mg Oral BID AC       ROS     Remainder of 10 point ROS as above or otherwise negative    PHYSICAL EXAM     Filed Vitals:    08/30/15 1154   BP: 112/73   Pulse: 95   Temp: 98 F (36.7  C)   Resp: 19   SpO2: 94%       Temperature: Temp  Min: 97.7 F (36.5 C)  Max: 102 F (38.9 C)  Pulse: Pulse  Min: 94  Max: 111  Respiratory: Resp  Min: 19  Max: 21  Non-Invasive BP: BP  Min: 93/47  Max: 118/72  Pulse Oximetry SpO2  Min: 90 %  Max: 100 %    Intake and Output Summary (Last 24 hours) at Date Time    Intake/Output Summary (Last 24 hours) at 08/30/15 1313  Last data filed at 08/30/15 0503   Gross per 24 hour   Intake 1504.58 ml   Output      0 ml   Net 1504.58 ml       GEN APPEARANCE: Normal;  A&OX3  HEENT: PERLA; EOMI; Conjunctiva Clear  NECK: Supple; No bruits  CVS: RRR, S1, S2; No  M/G/R  LUNGS: CTAB; No Wheezes; No Rhonchi: No rales  ABD: Soft; No TTP; + Normoactive BS  EXT: No edema; Pulses 2+ and intact  SKIN: No rash or Lesions  NEURO: CN 2-12 intact; No Focal neurological deficits      LABS       Recent Labs  Lab 08/30/15  0634 08/29/15  0853   WBC 10.16 10.27   RBC 3.66* 4.41   HGB 11.4* 13.9   HEMATOCRIT 34.4* 40.7   MCV 94.0 92.3   PLATELETS 134* 149         Recent Labs  Lab 08/30/15  0634 08/29/15  0853   SODIUM 141 137   POTASSIUM 3.8 3.6   CHLORIDE 113* 103   CO2 24 24   BUN 12 18   CREATININE 0.8 1.0   GLUCOSE 105* 123*   CALCIUM 7.3* 8.6         Recent Labs  Lab 08/29/15  0853   ALT 29   AST (SGOT) 50*   BILIRUBIN, TOTAL 0.5   BILIRUBIN, DIRECT 0.2   ALBUMIN 4.2   ALKALINE PHOSPHATASE 58         Recent Labs  Lab 08/29/15  0853   TROPONIN I <0.01             Microbiology Results     Procedure Component Value Units Date/Time    Blood culture #1 (Aerobic / Anaerobic) [[id number]] Collected:  08/29/15 0851    Specimen Information:  Arm from Blood Updated:  08/29/15 1516    Narrative:      RAC  1 BLUE+1 PURPLE    Blood culture #2  (Aerobic / Anaerobic) [[id number]] Collected:  08/29/15 0859    Specimen Information:  Arm from Blood Updated:  08/29/15 1516    Narrative:      Rhand  1 BLUE+1 PURPLE    Legionella antigen, urine [[id number]] Collected:  08/29/15 1927    Specimen Information:  Urine  from Urine, Clean Catch Updated:  08/30/15 0513    Narrative:      ORDER#: [id number]                                    ORDERED BY: Loleta Dicker  SOURCE: Urine, Clean Catch                           COLLECTED:  08/29/15 19:27  ANTIBIOTICS AT COLL.:                                RECEIVED :  08/30/15 00:37  Legionella, Rapid Urinary Antigen          FINAL       08/30/15 05:13  08/30/15   Negative for Legionella pneumophila Serogroup 1 Antigen             Limitations of Test:             1. Negative results do not exclude infection with Legionella                pneumophila Serogroup 1.             2. Does not detect other serogroups of L. pneumophila                or other Legionella species.  Test Reference Range: Negative      Rapid influenza A/B antigens [[id number]] Collected:  08/29/15 0853    Specimen Information:  Nasopharyngeal from Nasal Aspirate Updated:  08/29/15 0913    Narrative:      ORDER#: [id number]                                    ORDERED BY: Cari Caraway  SOURCE: Nasal Aspirate                               COLLECTED:  08/29/15 08:53  ANTIBIOTICS AT COLL.:                                RECEIVED :  08/29/15 08:59  Influenza Rapid Antigen A&B                FINAL       08/29/15 09:13   +  08/29/15   Positive for Influenza B and Negative for Influenza A             Reference Range: Negative      STREP PNEUMONIA, RAPID URINARY ANTIGEN [[id number]] Collected:  08/29/15 1927    Specimen Information:  Urine, Clean Catch Updated:  08/30/15 0513    Narrative:      ORDER#: [id number]                                    ORDERED BY: Loleta Dicker  SOURCE: Urine, Clean Catch                           COLLECTED:  08/29/15 19:27  ANTIBIOTICS AT COLL.:                                RECEIVED :  08/30/15 00:37  S. pneumoniae, Rapid Urinary Antigen       FINAL       08/30/15 05:12  08/30/15   Negative for Streptococcus pneumoniae Urinary Antigen             Note:             This is a presumptive  test for the direct qualitative             detection of bacterial antigen. This test is not intended as             a substitute for a gram stain and bacterial culture. Samples             with extremely low levels of antigen may yield negative             results.             Reference Range: Negative      Urine culture [[id number]] Collected:  08/29/15 1032    Specimen Information:  Urine from Urine, Clean Catch Updated:  08/30/15 1222    Narrative:      ORDER#: [id number]  ORDERED BY: TRIPATHI, PAUL  SOURCE: Urine, Clean Catch                           COLLECTED:  08/29/15 10:32  ANTIBIOTICS AT COLL.:                                RECEIVED :  08/29/15 14:21  Culture Urine                              FINAL       08/30/15 12:22  08/30/15   No growth of >1,000 CFU/ML, No further work             RADIOLOGY     Radiological Procedure reviewed.    Chest AP Portable   Final Result         Right upper lobe infiltrate concerning for pneumonia      J. Carole Binning, MD    08/29/2015 8:41 AM             Signed,  Len Childs, MD  1:13 PM 08/30/2015

## 2015-08-31 DIAGNOSIS — G43109 Migraine with aura, not intractable, without status migrainosus: Secondary | ICD-10-CM

## 2015-08-31 LAB — CBC AND DIFFERENTIAL
Basophils Absolute Automated: 0.01 10*3/uL (ref 0.00–0.20)
Basophils Automated: 0 %
Eosinophils Absolute Automated: 0.14 10*3/uL (ref 0.00–0.70)
Eosinophils Automated: 1 %
Hematocrit: 37.1 % (ref 37.0–47.0)
Hgb: 12.3 g/dL (ref 12.0–16.0)
Immature Granulocytes Absolute: 0.03 10*3/uL
Immature Granulocytes: 0 %
Lymphocytes Absolute Automated: 0.59 10*3/uL (ref 0.50–4.40)
Lymphocytes Automated: 6 %
MCH: 31.3 pg (ref 28.0–32.0)
MCHC: 33.2 g/dL (ref 32.0–36.0)
MCV: 94.4 fL (ref 80.0–100.0)
MPV: 10.6 fL (ref 9.4–12.3)
Monocytes Absolute Automated: 0.55 10*3/uL (ref 0.00–1.20)
Monocytes: 5 %
Neutrophils Absolute: 9.47 10*3/uL — ABNORMAL HIGH (ref 1.80–8.10)
Neutrophils: 88 %
Nucleated RBC: 0 /100 WBC (ref 0–1)
Platelets: 150 10*3/uL (ref 140–400)
RBC: 3.93 10*6/uL — ABNORMAL LOW (ref 4.20–5.40)
RDW: 14 % (ref 12–15)
WBC: 10.76 10*3/uL (ref 3.50–10.80)

## 2015-08-31 LAB — BASIC METABOLIC PANEL
Anion Gap: 3 — ABNORMAL LOW (ref 5.0–15.0)
BUN: 9 mg/dL (ref 7–19)
CO2: 27 mEq/L (ref 22–29)
Calcium: 8.1 mg/dL — ABNORMAL LOW (ref 8.5–10.5)
Chloride: 116 mEq/L — ABNORMAL HIGH (ref 100–111)
Creatinine: 0.8 mg/dL (ref 0.6–1.0)
Glucose: 97 mg/dL (ref 70–100)
Potassium: 4.2 mEq/L (ref 3.5–5.1)
Sodium: 146 mEq/L — ABNORMAL HIGH (ref 136–145)

## 2015-08-31 LAB — GFR: EGFR: >60

## 2015-08-31 LAB — MAGNESIUM: Magnesium: 1.7 mg/dL (ref 1.6–2.6)

## 2015-08-31 MED ORDER — ONDANSETRON 4 MG PO TBDP
4.0000 mg | ORAL_TABLET | Freq: Four times a day (QID) | ORAL | Status: DC | PRN
Start: 2015-08-31 — End: 2015-09-08

## 2015-08-31 MED ORDER — ONDANSETRON HCL 4 MG/2ML IJ SOLN
4.0000 mg | Freq: Four times a day (QID) | INTRAMUSCULAR | Status: DC | PRN
Start: 2015-08-31 — End: 2015-09-08
  Administered 2015-09-01: 4 mg via INTRAVENOUS
  Filled 2015-08-31: qty 2

## 2015-08-31 MED ORDER — HYDROCODONE-HOMATROPINE 5-1.5 MG/5ML PO SYRP
5.0000 mL | ORAL_SOLUTION | ORAL | Status: DC | PRN
Start: 2015-08-31 — End: 2015-09-08
  Administered 2015-08-31 – 2015-09-01 (×2): 5 mL via ORAL
  Filled 2015-08-31 (×4): qty 5

## 2015-08-31 MED ORDER — BUTALBITAL-APAP-CAFFEINE 50-325-40 MG PO TABS
2.0000 | ORAL_TABLET | Freq: Four times a day (QID) | ORAL | Status: DC | PRN
Start: 2015-08-31 — End: 2015-09-08
  Administered 2015-08-31 – 2015-09-01 (×7): 2 via ORAL
  Filled 2015-08-31 (×7): qty 2

## 2015-08-31 NOTE — UM Notes (Signed)
08/29/15 55 year old female with past medical history significant  for GERD with history of chronic low back pain with spinal stenosis status  post lumbar fusion and history of a partial thyroidectomy, who presented to  the ED earlier today due to progressively worsening productive cough with  fever and associated chills. The patient states that her symptoms started  approximately 8 days ago, at which time she had severe sore throat and  myalgias noted. The patient initially felt the symptoms were due to a  viral syndrome and did not seek medical care at that time. She was seen in her PCP's office approximately 2 days ago, at which time she had a throat culture performed and Rapid Strep was noted to be negative. The patient was provided a prescription for Jerilynn Som from PCP office and instructed to continue supportive care. The patient over the past 24 hours had significant worsening in symptoms with T-max of 102.6 at home with ongoing chills and worsening shortness of breath. The patient presented to the ED earlier today and also noting new onset nausea, mild headaches and    IN ER 99.4/109/26/104/60  WBC 10.2/H/H 13.9/40.7  U/A Neg  Na 137/K3.6/Cl103/Co2 24/BUN/CR 18/1.0/Glu 123  BL CX Pending  UR CX pending  BNP 80  Rapid Influenza Neg    CXR Right upper lobe infiltrate concerning for pneumonia    In ER Proventil Neb  Atrovent Neb  Decadrom 10 mg IV  NS bolus  Rocephin 1 GM IV  Zithromax 500 mg IV  Zofran 4 mg IV  Tamiflu 75 mg PO  Torodol 30 mg IV    08/29/15 Admit to Inpatient (Order [id number])  Zithromax 500 mg IV qd  Tessalon 100 mg PO tid  Rocephin 1 GM IV qd  Delsym 30 mg PO q 12h   Tamiflu 75 mg PO q12h  Protonix 40 mg PO BIDTylenol 65 0 mg q4h prn  Fioricet 2 tabs po 4x daily prn  Zofran 4 mg IV q6h prn    08/30/15 Nutrition consult  Neurology consult / Continued headaches/Sepsis  Medications remain the sane    Cyndia Bent RN ACM  Clinical Case Manager  [facility]   [address]   [place], [place] [address]  NPI: [id number]  Tax ID: [id number]  Phone: [phone]  Fax: [phone]     Please use fax number [phone] to provide authorization for hospital services or to request additional information.

## 2015-08-31 NOTE — Consults (Signed)
[facility] Consultation Note                                       Date Time: 08/31/2015 10:21 AM  Patient Name: [name], [name]  Requesting Physician: Len Childs, MD  Date of Admission: 08/29/2015    CC / Reason for Consultation: Sepsis/headache/CAP       Assessment:     Migraine headache in the setting of sepsis, pneumonia and generalized weakness.  No meningeal signs to suggest meningitis.      Plan:     Fioricet q 4 hrly  May add Motrin 600 mg with Fioricet if no contraindication  Continue antibiotics as per ID/Primary team  Supportive/symptomatic care      HPI   [name] is a 55 y.o. female who presents to the hospital with pneumonia and generalized weakness.  She does have history of migraines and has been having headaches usually on the left temporal region which   seems to get worse, whenever she coughs.  She also complaining of mild neck pain and stiffness,   though does not have any meningeal signs at this time.  The headaches are described as throbbing with mild   photophobia, phonophobia, nausea and dizziness.  She used to take Imitrex along with Cambia once in   2 months for sporadic breakthrough migraines.  The headache seems to be some the same as previous   migraines, except that it is unilateral and worsens with cough.  We will do a trial of 4 Fioricet along with Motrin every 4-6 hour, scheduled.  If there is no improvement in the next 24 hours, we will consider imaging of the brain or spinal tap.    Past Medical Hx     Past Medical History   Diagnosis Date   . PONV (postoperative nausea and vomiting)    . GERD (gastroesophageal reflux disease)    . Arthritis      THUMB   . Rash      PSORIASIS   . Headache(784.0)      MIGRAINES, headache today   . Low back pain      spinal stenosis, right sided pain, no neuro deficits   . 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 Varicose veins      [facility]   .  Scoliosis      slight curvature-noted by orthopedist a many years ago   . Anemia      with pregnancy          Past Surgical Hx:     Past Surgical History   Procedure Laterality Date   . Bunionectomy  2003   . Esophagogastroduodenoscopy       X3   . Colonoscopy  02/2011   . Thyroid surgery  age 33     benign mass removed   . Arthrodesis  Feb 2013     Left foot   . Breast surgery  1993     right benign hardened cyst   . Laminectomy, lumbar, removal hnp, level 1  02/28/2013     Procedure: LAMINECTOMY, LUMBAR, REMOVAL HNP, LEVEL 1;  Surgeon: Marnee Guarneri, MD;  Location: [facility];  Service: Neurosurgery;  Laterality: Right;  RIGHT L5-S1 LUMBAR FORAMINOTOMY W/EXCISION OF SYNOVIAL CYST   . Back surgery  Nov. 2014     nerve opening   . Skin biopsy       various times-brown spots removed by dermatologist   . Spine surgery  Nov. 2014     nerve opening   . Laminectomy, posterior lumbar, decomp, fusion, level 2 N/A 05/14/2014     Procedure: LAMINECTOMY, POSTERIOR LUMBAR, DECOMP, FUSION, LEVEL 2;  Surgeon: Tharon Aquas, MD;  Location: [facility];  Service: Orthopedics;  Laterality: N/A;  LAMINECTOMY AND FUSION L4-S1,BMP-2          Family Medical History:    History reviewed. No pertinent family history.    Social Hx     Social History     Social History   . Marital Status: Single     Spouse Name: N/A   . Number of Children: N/A   . Years of Education: N/A     Occupational History   . Not on file.     Social History Main Topics   . Smoking status: Never Smoker    . Smokeless tobacco: Never Used      Comment: n/a   . Alcohol Use: 1.8 oz/week     4 Glasses of wine, 0 Cans of beer, 0 Shots of liquor, 0 Standard drinks or equivalent per week      Comment: occasionally a mixed drink   . Drug Use: No   . Sexual Activity:     Partners: Male     Birth Control/ Protection: Condom     Other Topics Concern   . Not on file     Social History Narrative       Meds     Home :   Prior to Admission medications     Medication Sig Start Date End Date Taking? Authorizing Provider   benzonatate (TESSALON) 100 MG capsule Take 100 mg by mouth 3 (three) times daily. For 7 days 08/27/15  Yes [provider]   CALCIUM PO Take 1 tablet by mouth daily. CHEWABLE   Yes [provider]   dexlansoprazole 60 MG capsule Take 60 mg by mouth every morning.    Yes [provider]   Multiple Vitamins-Minerals (MULTIVITAMIN PO) Take 1 tablet by mouth daily.     Yes [provider]   Vitamin D3 (CHOLECALCIFEROL) 5000 UNITS Cap capsule  04/10/14  Yes [provider]   calcipotriene-betamethasone (TACLONEX) ointment Apply topically as needed.    01/30/14   [provider]   desonide (DESOWEN) 0.05 % cream Apply topically.    01/30/14   [provider]   SUMAtriptan (IMITREX) 100 MG tablet Take 100 mg by mouth as needed.      [provider]   SUMAtriptan Succinate (IMITREX IJ) Inject 0.5 mg as directed as needed.     [provider]      Inpatient :   Current Facility-Administered Medications   Medication Dose Route Frequency   . cefTRIAXone  1 g Intravenous Q24H SCH    And   . azithromycin  500 mg Intravenous Q24H SCH   . benzonatate  100 mg Oral TID   . dextromethorphan polistirex ER  30 mg Oral Q12H SCH   . enoxaparin  40 mg Subcutaneous Daily   . oseltamivir  75 mg Oral Q12H SCH   . pantoprazole  40 mg Oral BID AC         Allergies    Sulfa antibiotics; Percocet; Tramadol-acetaminophen; and Tramadol hcl er  Review of Systems     All other systems were reviewed and are negative except for that mentioned in the HPI    Physical Exam:   Temp:  [97 F (36.1 C)-98.4 F (36.9 C)] 97.5 F (36.4 C)  Heart Rate:  [85-95] 85  Resp Rate:  [18-20] 20  BP: (112-135)/(68-80) 135/77 mmHg     Constitutional: Vital signs reviewed.   Head: Normocephalic, atraumatic, neck supple no JVD  Eyes: No conjunctival injection. No discharge.   ENT: Mucous membranes moist   Neck: Normal  range of motion. Non tender.   Respiratory/Chest: in mild respiratory distress.   Cardiovascular: Regular rate and rhythm. No murmur.   Lower Extremity: No edema. No cyanosis.   Skin: Warm and dry. No rash.   Lymphatic: No cervical lymphadenopathy.   Psychiatric: Normal affect.     Mental Status: The patient was awake, alert, and oriented. Conversation   was appropriate. Speech was slow, and the patient followed commands   consistently.  Cranial Nerves: Pupils were equally round and reactive to light. Extraocular  movements were intact, without nystagmus. The patient's face was symmetric,   and facial sensation was intact. Tongue and palate were midline.   Motor Exam: The patient felt generalized weakness throughout. Tone and bulk appeared   normal. There were no abnormal movements or tremor noted.   Sensation: Light touch grossly intact.   Gait: Deferred.  Deep Tendon Reflexes: 1+ and symmetric throughout.       Labs:     Results     Procedure Component Value Units Date/Time    GFR [[id number]] Collected:  08/31/15 0649     EGFR >60.0 Updated:  08/31/15 0721    Basic Metabolic Panel [[id number]]  (Abnormal) Collected:  08/31/15 0649     Glucose 97 mg/dL Updated:  78/29/56 2130     BUN 9 mg/dL      Creatinine 0.8 mg/dL      Calcium 8.1 (L) mg/dL      Sodium 865 (H) mEq/L      Potassium 4.2 mEq/L      Chloride 116 (H) mEq/L      CO2 27 mEq/L      Anion Gap 3.0 (L)     Magnesium [[id number]] Collected:  08/31/15 0649     Magnesium 1.7 mg/dL Updated:  28/41/32 4401    CBC with differential [[id number]]  (Abnormal) Collected:  08/31/15 0536    Specimen Information:  Blood from Blood Updated:  08/31/15 0650     WBC 10.76 x10 3/uL      Hgb 12.3 g/dL      Hematocrit 40.3 %      Platelets 150 x10 3/uL      RBC 3.93 (L) x10 6/uL      MCV 94.4 fL      MCH 31.3 pg      MCHC 33.2 g/dL      RDW 14 %      MPV 10.6 fL      Neutrophils 88 %      Lymphocytes Automated 6 %      Monocytes 5 %      Eosinophils Automated 1 %      Basophils  Automated 0 %      Immature Granulocyte 0 %      Nucleated RBC 0 /100 WBC      Neutrophils Absolute 9.47 (H) x10 3/uL      Abs Lymph Automated 0.59 x10 3/uL  Abs Mono Automated 0.55 x10 3/uL      Abs Eos Automated 0.14 x10 3/uL      Absolute Baso Automated 0.01 x10 3/uL      Absolute Immature Granulocyte 0.03 x10 3/uL     Narrative:      Starting for 1 Occurrences    Blood culture #1 (Aerobic / Anaerobic) [[id number]] Collected:  08/29/15 0851    Specimen Information:  Arm from Blood Updated:  08/30/15 1521    Narrative:      ORDER#: [id number]                                    ORDERED BY: Cari Caraway  SOURCE: Blood arm                                    COLLECTED:  08/29/15 08:51  ANTIBIOTICS AT COLL.:                                RECEIVED :  08/29/15 15:16  Culture Blood Aerobic and Anaerobic        PRELIM      08/30/15 15:21  08/30/15   No Growth after 1 day/s of incubation.      Blood culture #2  (Aerobic / Anaerobic) [[id number]] Collected:  08/29/15 0859    Specimen Information:  Arm from Blood Updated:  08/30/15 1521    Narrative:      ORDER#: [id number]                                    ORDERED BY: Cari Caraway  SOURCE: Blood arm                                    COLLECTED:  08/29/15 08:59  ANTIBIOTICS AT COLL.:                                RECEIVED :  08/29/15 15:16  Culture Blood Aerobic and Anaerobic        PRELIM      08/30/15 15:21  08/30/15   No Growth after 1 day/s of incubation.      Urine culture [[id number]] Collected:  08/29/15 1032    Specimen Information:  Urine from Urine, Clean Catch Updated:  08/30/15 1222    Narrative:      ORDER#: [id number]                                    ORDERED BY: Cari Caraway  SOURCE: Urine, Clean Catch                           COLLECTED:  08/29/15 10:32  ANTIBIOTICS AT COLL.:                                RECEIVED :  08/29/15 14:21  Culture Urine  FINAL       08/30/15 12:22  08/30/15   No growth of >1,000 CFU/ML, No further  work            Rads:   No results found for this or any previous visit.    Lynann Bologna, MD  Neurology, Neurophysiology  [facility]

## 2015-08-31 NOTE — Plan of Care (Signed)
Problem: Safety  Goal: Patient will be free from injury during hospitalization  Outcome: Progressing  Pt resting in bed. Bed in the lowest position. Call bell and table within reach. Hourly rounding done. Will continue to monitor.     Problem: Pain  Goal: Patient's pain/discomfort is manageable  Outcome: Progressing  Pt c/o of headache. PRN fioricet given. Will continue to monitor.

## 2015-08-31 NOTE — Consults (Signed)
CONSULTATION    Date Time: 08/31/2015 12:52 PM  Patient Name: [name], [name]  Requesting Physician: Len Childs, MD      Reason for Consultation:   Influenza/pneumonia    Assessment:   55 year old white female admitted with fever, cough, SOB, anterior chest pain, headaches    Diagnosed with influenza B and RUL community acquired pneumonia    On day # 3 Ceftriaxone/Azithromycin/Tamiflu  Fevers improved  Feels a bit better with treatment of headaches with fioricet    Continues to have anterior CP, cough, SOB, and is using oxygen 3 liters/nc    Plan:   Would continue present therapy which is appropriate, although patient is responding somewhat slowly    If no improvement, consider changing to Levofloxacin/Vancomycin and imaging chest with CT    Thanks for the consult, [place] [id number]        History:   [name] is a 55 y.o. female who presents to the hospital on 08/29/2015 with cough, sore throat and runny nose x one week, then high fever/SOB, anterior pleuritic CP, headache, SOB, prompting trip to ED. 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 CXR shows RUL infiltrate; influenza B rapid antigen is positive. She has been started on Tamiflu, Ceftriaxone/Azithromycin, now day # 3. Fever has resolved, however she continues to have cough/SOB/anterior CP. She was seen by neurology for migraine headaches, treated with Fioricet. Feels slightly better as HAs are dissipating.  She remains on 3 liters oxygen/nc. She did have an influenza vaccine.   Past Medical History:     Past Medical History   Diagnosis Date   . PONV (postoperative nausea and vomiting)    . GERD (gastroesophageal reflux disease)    . Arthritis      THUMB   . Rash      PSORIASIS   . Headache(784.0)      MIGRAINES, headache today   . Low back pain      spinal stenosis, right sided pain, no neuro deficits   . Neuropathy      narrowing of opening  around nerve in spine (right side)   . Varicose veins      [facility]   . Scoliosis      slight curvature-noted by orthopedist a many years ago   . Anemia      with pregnancy       Past Surgical History:     Past Surgical History   Procedure Laterality Date   . Bunionectomy  2003   . Esophagogastroduodenoscopy       X3   . Colonoscopy  02/2011   . Thyroid surgery  age 72     benign mass removed   . Arthrodesis  Feb 2013     Left foot   . Breast surgery  1993     right benign hardened cyst   . Laminectomy, lumbar, removal hnp, level 1  02/28/2013     Procedure: LAMINECTOMY, LUMBAR, REMOVAL HNP, LEVEL 1;  Surgeon: Marnee Guarneri, MD;  Location: [facility];  Service: Neurosurgery;  Laterality: Right;  RIGHT L5-S1 LUMBAR FORAMINOTOMY W/EXCISION OF SYNOVIAL CYST   . Back surgery  Nov. 2014     nerve opening   . Skin biopsy       various times-brown spots removed by dermatologist   . Spine  surgery  Nov. 2014     nerve opening   . Laminectomy, posterior lumbar, decomp, fusion, level 2 N/A 05/14/2014     Procedure: LAMINECTOMY, POSTERIOR LUMBAR, DECOMP, FUSION, LEVEL 2;  Surgeon: Tharon Aquas, MD;  Location: [facility];  Service: Orthopedics;  Laterality: N/A;  LAMINECTOMY AND FUSION L4-S1,BMP-2         Family History:   History reviewed. No pertinent family history.    Social History:   Lives alone  Works in an elementary school  No recent travel  Social History     Social History   . Marital Status: Single     Spouse Name: N/A   . Number of Children: N/A   . Years of Education: N/A     Social History Main Topics   . Smoking status: Never Smoker    . Smokeless tobacco: Never Used      Comment: n/a   . Alcohol Use: 1.8 oz/week     4 Glasses of wine, 0 Cans of beer, 0 Shots of liquor, 0 Standard drinks or equivalent per week      Comment: occasionally a mixed drink   . Drug Use: No   . Sexual Activity:     Partners: Male     Birth Control/ Protection: Condom     Other Topics  Concern   . Not on file     Social History Narrative       Allergies:     Allergies   Allergen Reactions   . Sulfa Antibiotics Rash and Fever   . Percocet [Oxycodone-Acetaminophen] Other (See Comments)     Confusion   . Tramadol-Acetaminophen Nausea Only and Other (See Comments)   . Tramadol Hcl Er Itching, Nausea And Vomiting, Other (See Comments) and Rash       Medications:     Current Facility-Administered Medications   Medication Dose Route Frequency   . cefTRIAXone  1 g Intravenous Q24H SCH    And   . azithromycin  500 mg Intravenous Q24H SCH   . benzonatate  100 mg Oral TID   . dextromethorphan polistirex ER  30 mg Oral Q12H SCH   . enoxaparin  40 mg Subcutaneous Daily   . oseltamivir  75 mg Oral Q12H SCH   . pantoprazole  40 mg Oral BID AC       Review of Systems:   A comprehensive review of systems was:    Positive for cough, SOB, anterior CP, fever, runny nose, headache, nausea, decreased appetite    Negative for rash, vomiting, diarrhea, abdominal pain, joint pain, dysuria    Physical Exam:   Tm 99.8  Filed Vitals:    08/31/15 1141   BP: 137/79   Pulse: 78   Temp: 96.8 F (36 C)   Resp: 19   SpO2: 92%     WDWN 55 year old white female in mild/moderate distress  Gets dyspneic with speaking, weak voice, coughing  Skin without rash  Oral mucosa moist  Neck without adenopathy  Well healed surgical scar anterior neck  Lungs with scattered crackles  Heart RRR  Abdomen soft, non tender, normal bowel sounds  BLE without edema    Lines: p IV    Labs Reviewed:     Results     Procedure Component Value Units Date/Time    GFR [[id number]] Collected:  08/31/15 0649     EGFR >60.0 Updated:  08/31/15 0721    Basic Metabolic Panel [[id number]]  (  Abnormal) Collected:  08/31/15 0649     Glucose 97 mg/dL Updated:  16/10/96 0454     BUN 9 mg/dL      Creatinine 0.8 mg/dL      Calcium 8.1 (L) mg/dL      Sodium 098 (H) mEq/L      Potassium 4.2 mEq/L      Chloride 116 (H) mEq/L      CO2 27 mEq/L      Anion Gap 3.0 (L)      Magnesium [[id number]] Collected:  08/31/15 0649     Magnesium 1.7 mg/dL Updated:  56/21/30 8657    CBC with differential [[id number]]  (Abnormal) Collected:  08/31/15 0536    Specimen Information:  Blood from Blood Updated:  08/31/15 0650     WBC 10.76 x10 3/uL      Hgb 12.3 g/dL      Hematocrit 84.1 %      Platelets 150 x10 3/uL      RBC 3.93 (L) x10 6/uL      MCV 94.4 fL      MCH 31.3 pg      MCHC 33.2 g/dL      RDW 14 %      MPV 10.6 fL      Neutrophils 88 %      Lymphocytes Automated 6 %      Monocytes 5 %      Eosinophils Automated 1 %      Basophils Automated 0 %      Immature Granulocyte 0 %      Nucleated RBC 0 /100 WBC      Neutrophils Absolute 9.47 (H) x10 3/uL      Abs Lymph Automated 0.59 x10 3/uL      Abs Mono Automated 0.55 x10 3/uL      Abs Eos Automated 0.14 x10 3/uL      Absolute Baso Automated 0.01 x10 3/uL      Absolute Immature Granulocyte 0.03 x10 3/uL     Narrative:      Starting for 1 Occurrences    Blood culture #1 (Aerobic / Anaerobic) [[id number]] Collected:  08/29/15 0851    Specimen Information:  Arm from Blood Updated:  08/30/15 1521    Narrative:      ORDER#: [id number]                                    ORDERED BY: Cari Caraway  SOURCE: Blood arm                                    COLLECTED:  08/29/15 08:51  ANTIBIOTICS AT COLL.:                                RECEIVED :  08/29/15 15:16  Culture Blood Aerobic and Anaerobic        PRELIM      08/30/15 15:21  08/30/15   No Growth after 1 day/s of incubation.      Blood culture #2  (Aerobic / Anaerobic) [[id number]] Collected:  08/29/15 0859    Specimen Information:  Arm from Blood Updated:  08/30/15 1521    Narrative:      ORDER#: [id number]  ORDERED BY: TRIPATHI, PAUL  SOURCE: Blood arm                                    COLLECTED:  08/29/15 08:59  ANTIBIOTICS AT COLL.:                                RECEIVED :  08/29/15 15:16  Culture Blood Aerobic and Anaerobic        PRELIM      08/30/15 15:21  08/30/15    No Growth after 1 day/s of incubation.                Rads:   Radiological Procedure reviewed.       5/12 CXR RUL infiltrate    Signed by: Camillo Flaming Nazir Hacker

## 2015-08-31 NOTE — Plan of Care (Signed)
Problem: Safety  Goal: Patient will be free from injury during hospitalization  Outcome: Progressing  Call bell within reach. Bed at the lowest position. Pt ambulates to the bathroom independently. Stable gait noted. Hourly rounding done. No s/s of distress noted.     Problem: Pain  Goal: Patient's pain/discomfort is manageable  Outcome: Progressing  Prn medication given for migraine headache. Will continue to monitor.     Problem: Inadequate Gas Exchange  Goal: Adequately oxygenating and ventilation is improved  Outcome: Progressing  Pt remained on 3L oxygen.

## 2015-08-31 NOTE — Progress Notes (Signed)
Patient: [name]  Date: 08/31/2015   LOS: 2 Days  Admission Date: 08/29/2015   MRN: [id number]  Attending: Norton Pastel MD     ASSESSMENT/PLAN     [name] is a 55 y.o. female admitted with Fever/cough    Assessment and Plan:    1. Sepsis from influenza B and probably superimposed Bacterial pneumonia.   Continue IV ABX covering for CAP Ceftriaxone, Zithro  Continue Tamiflu as well but her symptoms already are of ~10 days duration  Decrease IVF as no tachycardia now  Add hycodan cough suppressant  Slowly improving   Appreciate ID input  If still symptomatic in AM will probably change ABX and also get CT chest    2. Hypoxemic resp failure sec to #1  Continue O2 supplement  No wheezing    3. Mild thrombocytopenia probably from Infection  resolved    4. GERD on PPI    5. Headache has hx of Migraine HA (follows with Dr Hamilton Capri)  Imitrex not helping, Will add Fioricet  Appreciate Neurology input    Analgesia: Tylenol    Nutrition: Regular Diet    GI Prophylaxis: PPI    DVT Prophylaxis: Lovenox 40 mg subQ daily       Code Status: full code    DISPO:   Continue IV ABX  ID and Neuro consult    Safety Checklist  Foley: Not present   IVs:  Peripheral IV   PT/OT: Not needed   Daily CBC & or Chem ordered:  SHM/ABIM guidelines (see #5) Yes                 SUBJECTIVE     [name] states she feels lot of congestion and also left hemi cranial HA  Still  intense coughing episodes associated with CP  Fever has trended down    MEDICATIONS     Current Facility-Administered Medications   Medication Dose Route Frequency   . cefTRIAXone  1 g Intravenous Q24H SCH    And   . azithromycin  500 mg Intravenous Q24H SCH   . benzonatate  100 mg Oral TID   . dextromethorphan polistirex ER  30 mg Oral Q12H SCH   . enoxaparin  40 mg Subcutaneous Daily   . oseltamivir  75 mg Oral Q12H SCH   . pantoprazole  40 mg Oral BID AC       ROS     Remainder of 10 point ROS as above or otherwise negative    PHYSICAL  EXAM     Filed Vitals:    08/31/15 0749   BP: 135/77   Pulse: 85   Temp: 97.5 F (36.4 C)   Resp: 20   SpO2: 95%       Temperature: Temp  Min: 97 F (36.1 C)  Max: 99.8 F (37.7 C)  Pulse: Pulse  Min: 85  Max: 95  Respiratory: Resp  Min: 18  Max: 21  Non-Invasive BP: BP  Min: 112/73  Max: 135/77  Pulse Oximetry SpO2  Min: 94 %  Max: 100 %    Intake and Output Summary (Last 24 hours) at Date Time  No intake or output data in the 24 hours ending 08/31/15 0756    GEN APPEARANCE: Normal;  A&OX3  HEENT: PERLA; EOMI; Conjunctiva Clear  NECK: Supple; No bruits  CVS: RRR, S1, S2; No M/G/R  LUNGS: CTAB; No Wheezes; No Rhonchi: No rales  ABD: Soft; No TTP; + Normoactive BS  EXT: No edema; Pulses 2+ and intact  SKIN: No rash or Lesions  NEURO: CN 2-12 intact; No Focal neurological deficits      LABS       Recent Labs  Lab 08/31/15  0536 08/30/15  0634 08/29/15  0853   WBC 10.76 10.16 10.27   RBC 3.93* 3.66* 4.41   HGB 12.3 11.4* 13.9   HEMATOCRIT 37.1 34.4* 40.7   MCV 94.4 94.0 92.3   PLATELETS 150 134* 149         Recent Labs  Lab 08/31/15  0649 08/30/15  0634 08/29/15  0853   SODIUM 146* 141 137   POTASSIUM 4.2 3.8 3.6   CHLORIDE 116* 113* 103   CO2 27 24 24    BUN 9 12 18    CREATININE 0.8 0.8 1.0   GLUCOSE 97 105* 123*   CALCIUM 8.1* 7.3* 8.6   MAGNESIUM 1.7  --   --          Recent Labs  Lab 08/29/15  0853   ALT 29   AST (SGOT) 50*   BILIRUBIN, TOTAL 0.5   BILIRUBIN, DIRECT 0.2   ALBUMIN 4.2   ALKALINE PHOSPHATASE 58         Recent Labs  Lab 08/29/15  0853   TROPONIN I <0.01             Microbiology Results     Procedure Component Value Units Date/Time    Blood culture #1 (Aerobic / Anaerobic) [[id number]] Collected:  08/29/15 0851    Specimen Information:  Arm from Blood Updated:  08/29/15 1516    Narrative:      RAC  1 BLUE+1 PURPLE    Blood culture #2  (Aerobic / Anaerobic) [[id number]] Collected:  08/29/15 0859    Specimen Information:  Arm from Blood Updated:  08/29/15 1516    Narrative:      Rhand  1 BLUE+1 PURPLE     Legionella antigen, urine [[id number]] Collected:  08/29/15 1927    Specimen Information:  Urine from Urine, Clean Catch Updated:  08/30/15 0513    Narrative:      ORDER#: [id number]                                    ORDERED BY: Loleta Dicker  SOURCE: Urine, Clean Catch                           COLLECTED:  08/29/15 19:27  ANTIBIOTICS AT COLL.:                                RECEIVED :  08/30/15 00:37  Legionella, Rapid Urinary Antigen          FINAL       08/30/15 05:13  08/30/15   Negative for Legionella pneumophila Serogroup 1 Antigen             Limitations of Test:             1. Negative results do not exclude infection with Legionella                pneumophila Serogroup 1.             2. Does not detect other serogroups of L. pneumophila  or other Legionella species.             Test Reference Range: Negative      Rapid influenza A/B antigens [[id number]] Collected:  08/29/15 0853    Specimen Information:  Nasopharyngeal from Nasal Aspirate Updated:  08/29/15 0913    Narrative:      ORDER#: [id number]                                    ORDERED BY: Cari Caraway  SOURCE: Nasal Aspirate                               COLLECTED:  08/29/15 08:53  ANTIBIOTICS AT COLL.:                                RECEIVED :  08/29/15 08:59  Influenza Rapid Antigen A&B                FINAL       08/29/15 09:13   +  08/29/15   Positive for Influenza B and Negative for Influenza A             Reference Range: Negative      STREP PNEUMONIA, RAPID URINARY ANTIGEN [[id number]] Collected:  08/29/15 1927    Specimen Information:  Urine, Clean Catch Updated:  08/30/15 0513    Narrative:      ORDER#: [id number]                                    ORDERED BY: Loleta Dicker  SOURCE: Urine, Clean Catch                           COLLECTED:  08/29/15 19:27  ANTIBIOTICS AT COLL.:                                RECEIVED :  08/30/15 00:37  S. pneumoniae, Rapid Urinary Antigen       FINAL       08/30/15 05:12  08/30/15   Negative for  Streptococcus pneumoniae Urinary Antigen             Note:             This is a presumptive test for the direct qualitative             detection of bacterial antigen. This test is not intended as             a substitute for a gram stain and bacterial culture. Samples             with extremely low levels of antigen may yield negative             results.             Reference Range: Negative      Urine culture [[id number]] Collected:  08/29/15 1032    Specimen Information:  Urine from Urine, Clean Catch Updated:  08/30/15 1222    Narrative:      ORDER#: [id number]  ORDERED BY: TRIPATHI, PAUL  SOURCE: Urine, Clean Catch                           COLLECTED:  08/29/15 10:32  ANTIBIOTICS AT COLL.:                                RECEIVED :  08/29/15 14:21  Culture Urine                              FINAL       08/30/15 12:22  08/30/15   No growth of >1,000 CFU/ML, No further work             RADIOLOGY     Radiological Procedure reviewed.    Chest AP Portable   Final Result         Right upper lobe infiltrate concerning for pneumonia      J. Carole Binning, MD    08/29/2015 8:41 AM             Signed,  Len Childs, MD  7:56 AM 08/31/2015

## 2015-09-01 ENCOUNTER — Inpatient Hospital Stay: Payer: No Typology Code available for payment source

## 2015-09-01 LAB — CBC AND DIFFERENTIAL
Basophils Absolute Automated: 0.02 10*3/uL (ref 0.00–0.20)
Basophils Automated: 0 %
Eosinophils Absolute Automated: 0.11 10*3/uL (ref 0.00–0.70)
Eosinophils Automated: 1 %
Hematocrit: 39.7 % (ref 37.0–47.0)
Hgb: 13.1 g/dL (ref 12.0–16.0)
Immature Granulocytes Absolute: 0.06 10*3/uL — ABNORMAL HIGH
Immature Granulocytes: 1 %
Lymphocytes Absolute Automated: 0.81 10*3/uL (ref 0.50–4.40)
Lymphocytes Automated: 8 %
MCH: 30.8 pg (ref 28.0–32.0)
MCHC: 33 g/dL (ref 32.0–36.0)
MCV: 93.4 fL (ref 80.0–100.0)
MPV: 10.6 fL (ref 9.4–12.3)
Monocytes Absolute Automated: 0.78 10*3/uL (ref 0.00–1.20)
Monocytes: 8 %
Neutrophils Absolute: 8.58 10*3/uL — ABNORMAL HIGH (ref 1.80–8.10)
Neutrophils: 83 %
Nucleated RBC: 0 /100 WBC (ref 0–1)
Platelets: 196 10*3/uL (ref 140–400)
RBC: 4.25 10*6/uL (ref 4.20–5.40)
RDW: 14 % (ref 12–15)
WBC: 10.3 10*3/uL (ref 3.50–10.80)

## 2015-09-01 LAB — BASIC METABOLIC PANEL
Anion Gap: 10 (ref 5.0–15.0)
BUN: 8 mg/dL (ref 7–19)
CO2: 22 mEq/L (ref 22–29)
Calcium: 8.4 mg/dL — ABNORMAL LOW (ref 8.5–10.5)
Chloride: 112 mEq/L — ABNORMAL HIGH (ref 100–111)
Creatinine: 0.8 mg/dL (ref 0.6–1.0)
Glucose: 76 mg/dL (ref 70–100)
Potassium: 3.3 mEq/L — ABNORMAL LOW (ref 3.5–5.1)
Sodium: 144 mEq/L (ref 136–145)

## 2015-09-01 LAB — MAGNESIUM: Magnesium: 1.9 mg/dL (ref 1.6–2.6)

## 2015-09-01 LAB — GFR: EGFR: >60

## 2015-09-01 MED ORDER — SODIUM CHLORIDE 0.9 % IV SOLN
INTRAVENOUS | Status: DC
Start: 2015-09-01 — End: 2015-09-02

## 2015-09-01 MED ORDER — KETOROLAC TROMETHAMINE 30 MG/ML IJ SOLN
30.0000 mg | Freq: Four times a day (QID) | INTRAMUSCULAR | Status: DC
Start: 2015-09-01 — End: 2015-09-04
  Administered 2015-09-01 – 2015-09-04 (×13): 30 mg via INTRAVENOUS
  Filled 2015-09-01 (×13): qty 1

## 2015-09-01 MED ORDER — VANCOMYCIN 1000 MG IN 250 ML NS IVPB VIAL-MATE (CNR)
1000.0000 mg | Freq: Once | INTRAVENOUS | Status: AC
Start: 2015-09-01 — End: 2015-09-01
  Administered 2015-09-01: 1000 mg via INTRAVENOUS
  Filled 2015-09-01: qty 250

## 2015-09-01 MED ORDER — POTASSIUM CHLORIDE 10 MEQ/100ML IV SOLN
10.0000 meq | Freq: Once | INTRAVENOUS | Status: AC
Start: 2015-09-01 — End: 2015-09-01
  Administered 2015-09-01: 10 meq via INTRAVENOUS
  Filled 2015-09-01: qty 100

## 2015-09-01 NOTE — Plan of Care (Signed)
Problem: Pain  Goal: Patient's pain/discomfort is manageable  Outcome: Progressing  Pt's headache controlled with PRN analgesic (fioricet) and scheduled Toradol.  Her pain is triggered by the coughing spells she gets frequently.  Will continue with plan of care.    Problem: Inadequate Tissue Perfusion  Goal: Adequate tissue perfusion will be maintained  Outcome: Progressing  Pt's VSS except for tachypnea.  Pt is on 5L O2 via NC, with sPO2 at 90%. Pt de-sats with lower O2 flow.  Strong congested non-productive cough. She reports feeling bad but a bit better than this morning.  Her appetite is very low.  Antibiotics and IV fluids infusing.  Voiding in the bathroom without difficulty.  Pt encouraged to take slow deep breaths.  Will continue to monitor.

## 2015-09-01 NOTE — Progress Notes (Signed)
INFECTIOUS DISEASE PROGRESS NOTE       Antibiotics:    Ceftriaxone, Azithromycin #4  Tamiflu #4    Lines:    PIV    Assessment/Plan:    55 y/o female with history of migraines, GERD    Admitted with fever, cough, SOB, chest pain, headaches  Influenza B  CXR with RUL pneumonia    Fevers improved and no leukocytosis, but continues to be quite hypoxic on 5L nasal cannula      1. Community acquired pneumonia with influenza B - continue current antibiotics and antiviral for now. Would obtain CT chest w/o contrast to evaluate RUL pneumonia given slow improvement and ongoing hypoxia.   Will give one dose of vancomycin 1gm and check MRSA screen.   Headache seems to be related to coughing and viral illness; no meningeal signs.     Discussed with patient [place] ([id number])  [facility]  [phone]    Subjective:    Feels poorly, unchanged. Still with frequent coughing that results in headache. Some nausea; no vomiting or diarrhea. Has pain in chest and SOB with coughing. Afebrile.     Physical Exam:      Filed Vitals:    09/01/15 1123   BP: 120/68   Pulse: 77   Temp: 97.4 F (36.3 C)   Resp: 19   SpO2: 90%   Vitals reviewed. Afebrile.  General: awake, alert, ill-appearing female; uncomfortable from coughing  HEENT: anicteric sclera; oropharynx clear; no nuchal rigidity  Heart: RRR  Lungs: right upper field crackles; no egophany, frequent coughing  Abdomen: soft, NT, ND, +BS  Extremities: no edema; no rash  Lines: PIV    Labs:    Lab results reviewed.  Recent Labs      09/01/15   0621   WBC  10.30   HGB  13.1   HEMATOCRIT  39.7   PLATELETS  196     Recent CMP   Recent Labs      09/01/15   0621   GLUCOSE  76   BUN  8   CREATININE  0.8   SODIUM  144   POTASSIUM  3.3*   CHLORIDE  112*   CO2  22       Microbiology:    All microbiology results reviewed.   5/12 blood cx: NGTD  5/12 urine cx: negative  5/12 urine strep/legionella Ags negative  5/12 rapid flu +influenza  B    Radiology:    All radiological procedures reviewed.   Radiology Results (24 Hour)     ** No results found for the last 24 hours. **              Jillene Bucks, MD ([id number])   [facility]   [phone]

## 2015-09-01 NOTE — Progress Notes (Signed)
Patient: [name]  Date: 09/01/2015   LOS: 3 Days  Admission Date: 08/29/2015   MRN: [id number]  Attending: Norton Pastel MD     ASSESSMENT/PLAN     [name] is a 55 y.o. female admitted with Fever/cough    Assessment and Plan:    1. Sepsis from influenza B and probably superimposed Bacterial pneumonia.   Continue IV ABX covering for CAP Ceftriaxone, Zithro  Continue Tamiflu as well but her symptoms already are of ~10 days duration  Continue hycodan cough suppressant  Slowly improving   Discussed with ID since persistently Hypoxic and also with significant cough will get CT chest Eval    2. Hypoxemic resp failure sec to #1  Continue O2 supplement, No wheezing    3. Mild thrombocytopenia probably from Infection  resolved    4. GERD on PPI    5. Headache has hx of Migraine HA (follows with Dr Hamilton Capri)  [name] was better with Fioricet but today again worse  Will Add standing Toradol as well  F/U with Neurology     6. Hypokalemia replete IV    Analgesia: Tylenol, Toradol, Fiorecet    Nutrition: Regular Diet    GI Prophylaxis: PPI    DVT Prophylaxis: Lovenox 40 mg subQ daily       Code Status: full code    DISPO:   Continue IV ABX  CT chest  Discussed with ID     Safety Checklist  Foley: Not present   IVs:  Peripheral IV   PT/OT: Not needed   Daily CBC & or Chem ordered:  SHM/ABIM guidelines (see #5) Yes                 SUBJECTIVE     [name] states she feels lot of cough and also left hemi cranial HA which was better yesterday with Fioricet but again worse today  No fever  Some nausea persistent  Cough still at time sis nagging    MEDICATIONS     Current Facility-Administered Medications   Medication Dose Route Frequency   . cefTRIAXone  1 g Intravenous Q24H SCH    And   . azithromycin  500 mg Intravenous Q24H SCH   . benzonatate  100 mg Oral TID   . dextromethorphan polistirex ER  30 mg Oral Q12H SCH   . enoxaparin  40 mg Subcutaneous Daily   . ketorolac  30 mg Intravenous 4  times per day   . oseltamivir  75 mg Oral Q12H SCH   . pantoprazole  40 mg Oral BID AC   . potassium chloride  10 mEq Intravenous Once   . vancomycin  1,000 mg Intravenous Once       ROS     Remainder of 10 point ROS as above or otherwise negative    PHYSICAL EXAM     Filed Vitals:    09/01/15 1123   BP: 120/68   Pulse: 77   Temp: 97.4 F (36.3 C)   Resp: 19   SpO2: 90%       Temperature: Temp  Min: 96.4 F (35.8 C)  Max: 97.4 F (36.3 C)  Pulse: Pulse  Min: 77  Max: 84  Respiratory: Resp  Min: 16  Max: 19  Non-Invasive BP: BP  Min: 108/74  Max: 128/71  Pulse Oximetry SpO2  Min: 90 %  Max: 90 %    Intake and Output Summary (Last 24 hours) at Date Time  Intake/Output Summary (Last 24 hours) at 09/01/15 1154  Last data filed at 08/31/15 1811   Gross per 24 hour   Intake   2588 ml   Output      0 ml   Net   2588 ml     In Mod pain  GEN APPEARANCE: Normal;  A&OX3  HEENT: PERLA; EOMI; Conjunctiva Clear  NECK: Supple; No bruits  CVS: RRR, S1, S2; No M/G/R  LUNGS: CTAB; No Wheezes; No Rhonchi: No rales  ABD: Soft; No TTP; + Normoactive BS  EXT: No edema; Pulses 2+ and intact  SKIN: No rash or Lesions  NEURO: CN 2-12 intact; No Focal neurological deficits      LABS       Recent Labs  Lab 09/01/15  0621 08/31/15  0536 08/30/15  0634   WBC 10.30 10.76 10.16   RBC 4.25 3.93* 3.66*   HGB 13.1 12.3 11.4*   HEMATOCRIT 39.7 37.1 34.4*   MCV 93.4 94.4 94.0   PLATELETS 196 150 134*         Recent Labs  Lab 09/01/15  0621 08/31/15  0649 08/30/15  0634 08/29/15  0853   SODIUM 144 146* 141 137   POTASSIUM 3.3* 4.2 3.8 3.6   CHLORIDE 112* 116* 113* 103   CO2 22 27 24 24    BUN 8 9 12 18    CREATININE 0.8 0.8 0.8 1.0   GLUCOSE 76 97 105* 123*   CALCIUM 8.4* 8.1* 7.3* 8.6   MAGNESIUM 1.9 1.7  --   --          Recent Labs  Lab 08/29/15  0853   ALT 29   AST (SGOT) 50*   BILIRUBIN, TOTAL 0.5   BILIRUBIN, DIRECT 0.2   ALBUMIN 4.2   ALKALINE PHOSPHATASE 58         Recent Labs  Lab 08/29/15  0853   TROPONIN I <0.01             Microbiology  Results     Procedure Component Value Units Date/Time    Blood culture #1 (Aerobic / Anaerobic) [[id number]] Collected:  08/29/15 0851    Specimen Information:  Arm from Blood Updated:  08/29/15 1516    Narrative:      RAC  1 BLUE+1 PURPLE    Blood culture #2  (Aerobic / Anaerobic) [[id number]] Collected:  08/29/15 0859    Specimen Information:  Arm from Blood Updated:  08/29/15 1516    Narrative:      Rhand  1 BLUE+1 PURPLE    Legionella antigen, urine [[id number]] Collected:  08/29/15 1927    Specimen Information:  Urine from Urine, Clean Catch Updated:  08/30/15 0513    Narrative:      ORDER#: [id number]                                    ORDERED BY: Loleta Dicker  SOURCE: Urine, Clean Catch                           COLLECTED:  08/29/15 19:27  ANTIBIOTICS AT COLL.:                                RECEIVED :  08/30/15 00:37  Legionella, Rapid Urinary Antigen  FINAL       08/30/15 05:13  08/30/15   Negative for Legionella pneumophila Serogroup 1 Antigen             Limitations of Test:             1. Negative results do not exclude infection with Legionella                pneumophila Serogroup 1.             2. Does not detect other serogroups of L. pneumophila                or other Legionella species.             Test Reference Range: Negative      Rapid influenza A/B antigens [[id number]] Collected:  08/29/15 0853    Specimen Information:  Nasopharyngeal from Nasal Aspirate Updated:  08/29/15 0913    Narrative:      ORDER#: [id number]                                    ORDERED BY: Cari Caraway  SOURCE: Nasal Aspirate                               COLLECTED:  08/29/15 08:53  ANTIBIOTICS AT COLL.:                                RECEIVED :  08/29/15 08:59  Influenza Rapid Antigen A&B                FINAL       08/29/15 09:13   +  08/29/15   Positive for Influenza B and Negative for Influenza A             Reference Range: Negative      STREP PNEUMONIA, RAPID URINARY ANTIGEN [[id number]] Collected:  08/29/15  1927    Specimen Information:  Urine, Clean Catch Updated:  08/30/15 0513    Narrative:      ORDER#: [id number]                                    ORDERED BY: Loleta Dicker  SOURCE: Urine, Clean Catch                           COLLECTED:  08/29/15 19:27  ANTIBIOTICS AT COLL.:                                RECEIVED :  08/30/15 00:37  S. pneumoniae, Rapid Urinary Antigen       FINAL       08/30/15 05:12  08/30/15   Negative for Streptococcus pneumoniae Urinary Antigen             Note:             This is a presumptive test for the direct qualitative             detection of bacterial antigen. This test is not intended as             a  substitute for a gram stain and bacterial culture. Samples             with extremely low levels of antigen may yield negative             results.             Reference Range: Negative      Urine culture [[id number]] Collected:  08/29/15 1032    Specimen Information:  Urine from Urine, Clean Catch Updated:  08/30/15 1222    Narrative:      ORDER#: [id number]                                    ORDERED BY: Cari Caraway  SOURCE: Urine, Clean Catch                           COLLECTED:  08/29/15 10:32  ANTIBIOTICS AT COLL.:                                RECEIVED :  08/29/15 14:21  Culture Urine                              FINAL       08/30/15 12:22  08/30/15   No growth of >1,000 CFU/ML, No further work             RADIOLOGY     Radiological Procedure reviewed.    Chest AP Portable   Final Result         Right upper lobe infiltrate concerning for pneumonia      J. Carole Binning, MD    08/29/2015 8:41 AM         CT Chest WO Contrast    (Results Pending)       Signed,  Len Childs, MD  11:54 AM 09/01/2015

## 2015-09-01 NOTE — UM Notes (Signed)
08/29/15 55 year old female with past medical history significant for GERD with history of chronic low back pain with spinal stenosis status post lumbar fusion and history of a partial thyroidectomy, who presented to the ED earlier today due to progressively worsening productive cough with  fever and associated chills. The patient states that her symptoms started approximately 8 days ago, at which time she had severe sore throat and myalgias noted. The patient initially felt the symptoms were due to a viral syndrome and did not seek medical care at that time. She was seen in her PCP's office approximately 2 days ago, at which time she had a throat culture performed and Rapid Strep was noted to be negative. The patient was provided a prescription for Jerilynn Som from PCP office and instructed to continue supportive care. The patient over the past 24 hours had significant worsening in symptoms with T-max of 102.6 at home with ongoing chills and worsening shortness of breath. The patient presented to the ED earlier today and also noting new onset nausea, mild headaches and    IN ER 99.4/109/26/104/60  WBC 10.2/H/H 13.9/40.7  U/A Neg  Na 137/K3.6/Cl103/Co2 24/BUN/CR 18/1.0/Glu 123  BL CX Pending  UR CX pending  BNP 80  Rapid Influenza Neg    CXR Right upper lobe infiltrate concerning for pneumonia    In ER Proventil Neb  Atrovent Neb  Decadrom 10 mg IV  NS bolus  Rocephin 1 GM IV  Zithromax 500 mg IV  Zofran 4 mg IV  Tamiflu 75 mg PO  Torodol 30 mg IV    08/29/15 Admit to Inpatient (Order [id number])    Per Attending H&P:  Sepsis in setting of influenza B as well as PNA  -significant bandemia (32%) and concern for possible superimposed bacterial PNA  Started on IV abx  Follow blood cultures closely  Continue aggressive hydration  Monitor HR closely  Started on Tamiflu  For persistent cough - schedule tessalon as well as PRN tussinex  Tele monitoring  Continue o2 supplementation as needed    Zithromax 500 mg IV  qd  Tessalon 100 mg PO tid  Rocephin 1 GM IV qd  Delsym 30 mg PO q 12h   Tamiflu 75 mg PO q12h  Protonix 40 mg PO BIDTylenol 65 0 mg q4h prn  Fioricet 2 tabs po 4x daily prn  Zofran 4 mg IV q6h prn    08/30/15 Nutrition consult  Neurology consult / Continued headaches/Sepsis  Medications remain the sane    Cyndia Bent RN ACM  Clinical Case Manager  [facility]   [address]   [place], [place] [address]  NPI: [id number]  Tax ID: [id number]  Phone: [phone]  Fax: [phone]     Please use fax number [phone] to provide authorization for hospital services or to request additional information.     ** This review is compiled from documentation provided by the treatment team within the patient's medical record. Krystal Eaton, RN, BSN  Clinical Case Manager - Utilization Review  [facility]   [address]   [place], [place] [address]  NPI: [id number]  Tax ID: [id number]  Phone: [phone]  Fax: [phone]     Please use fax number or insurance line to provide authorization for hospital services or to request additional information.     DATE OF REVIEW: 5/14-5/15    Requesting inpatient authorization for admission    5/14:  Labs: WBC 10.76, Neutrophils 9.47 (H), Na+ 146 (H), Cl- 116 (H), Ca+ 8.1 (L), Anion Gap 3.0 (L)    Per ID:  Reason for consult: Influenza/PNA  Assessment:   55 year old white female admitted with fever, cough, SOB, anterior chest pain, headaches    Diagnosed with influenza B and RUL community acquired pneumonia    On day # 3 Ceftriaxone/Azithromycin/Tamiflu  Fevers improved  Feels a bit better with treatment of headaches with fioricet    Continues to have anterior CP, cough, SOB, and is using oxygen 3 liters/nc    Plan:   Would continue present therapy which is appropriate, although patient is responding somewhat slowly    If no improvement, consider changing to Levofloxacin/Vancomycin and imaging chest with CT           --Clinical  Update 5/15:  Per ID:  Antibiotics:    Ceftriaxone, Azithromycin #4  Tamiflu #4           Fevers improved and no leukocytosis, but continues to be quite hypoxic on 5L nasal cannula    1. Community acquired pneumonia with influenza B - continue current antibiotics and antiviral for now. Would obtain CT chest w/o contrast to evaluate RUL pneumonia given slow improvement and ongoing hypoxia.   Will give one dose of vancomycin 1gm and check MRSA screen.   Headache seems to be related to coughing and viral illness; no meningeal signs.     Vital Sign Min/Max (last 24 hours)     Value Min Max    Temp 96.4 F (35.8 C) 97.4 F (36.3 C)    Heart Rate 77 84    Resp Rate 16 19    BP: Systolic 108 mmHg 128 mmHg    BP: Diastolic 68 mmHg 74 mmHg    SpO2 90 % 90 %      5L NC    Scheduled Meds:  Current Facility-Administered Medications   Medication Dose Route Frequency   . cefTRIAXone  1 g Intravenous Q24H SCH    And   . azithromycin  500 mg Intravenous Q24H SCH   . benzonatate  100 mg Oral TID   . dextromethorphan polistirex ER  30 mg Oral Q12H SCH   . enoxaparin  40 mg Subcutaneous Daily   . ketorolac  30 mg Intravenous 4 times per day   . oseltamivir  75 mg Oral Q12H SCH   . pantoprazole  40 mg Oral BID AC   . potassium chloride  10 mEq Intravenous Once   . vancomycin  1,000 mg Intravenous Once     Continuous Infusions:  . sodium chloride       PRN Meds:IV Zofran 4mg  x1, Hycodan 5ml x1, Fioricet 2 tabs x2

## 2015-09-02 LAB — BASIC METABOLIC PANEL
Anion Gap: 9 (ref 5.0–15.0)
BUN: 8 mg/dL (ref 7–19)
CO2: 24 mEq/L (ref 22–29)
Calcium: 7.8 mg/dL — ABNORMAL LOW (ref 8.5–10.5)
Chloride: 111 mEq/L (ref 100–111)
Creatinine: 0.8 mg/dL (ref 0.6–1.0)
Glucose: 77 mg/dL (ref 70–100)
Potassium: 3.1 mEq/L — ABNORMAL LOW (ref 3.5–5.1)
Sodium: 144 mEq/L (ref 136–145)

## 2015-09-02 LAB — CBC
Hematocrit: 35.2 % — ABNORMAL LOW (ref 37.0–47.0)
Hgb: 12.1 g/dL (ref 12.0–16.0)
MCH: 31.3 pg (ref 28.0–32.0)
MCHC: 34.4 g/dL (ref 32.0–36.0)
MCV: 91.2 fL (ref 80.0–100.0)
MPV: 10.4 fL (ref 9.4–12.3)
Nucleated RBC: 0 /100 WBC (ref 0–1)
Platelets: 257 10*3/uL (ref 140–400)
RBC: 3.86 10*6/uL — ABNORMAL LOW (ref 4.20–5.40)
RDW: 14 % (ref 12–15)
WBC: 11.09 10*3/uL — ABNORMAL HIGH (ref 3.50–10.80)

## 2015-09-02 LAB — MAGNESIUM: Magnesium: 1.7 mg/dL (ref 1.6–2.6)

## 2015-09-02 LAB — GFR: EGFR: >60

## 2015-09-02 LAB — B-TYPE NATRIURETIC PEPTIDE: B-Natriuretic Peptide: 328 pg/mL — ABNORMAL HIGH (ref 0–100)

## 2015-09-02 LAB — SEDIMENTATION RATE: Sed Rate: 35 mm/Hr — ABNORMAL HIGH (ref 0–20)

## 2015-09-02 MED ORDER — ALBUTEROL-IPRATROPIUM 2.5-0.5 (3) MG/3ML IN SOLN
3.0000 mL | RESPIRATORY_TRACT | Status: DC
Start: 2015-09-02 — End: 2015-09-04
  Administered 2015-09-02 – 2015-09-04 (×7): 3 mL via RESPIRATORY_TRACT
  Filled 2015-09-02 (×8): qty 3

## 2015-09-02 MED ORDER — SODIUM CHLORIDE 0.9 % IV MBP
600.0000 mg | Freq: Two times a day (BID) | INTRAVENOUS | Status: DC
Start: 2015-09-02 — End: 2015-09-03
  Administered 2015-09-02 – 2015-09-03 (×3): 600 mg via INTRAVENOUS
  Filled 2015-09-02 (×3): qty 600

## 2015-09-02 MED ORDER — POTASSIUM CHLORIDE CRYS ER 20 MEQ PO TBCR
40.0000 meq | EXTENDED_RELEASE_TABLET | Freq: Two times a day (BID) | ORAL | Status: AC
Start: 2015-09-02 — End: 2015-09-03
  Administered 2015-09-02 – 2015-09-03 (×2): 40 meq via ORAL
  Filled 2015-09-02 (×2): qty 2

## 2015-09-02 NOTE — Plan of Care (Signed)
Problem: Safety  Goal: Patient will be free from injury during hospitalization  Outcome: Progressing  Call bell within reach, bed in low position with side rails up x2, hourly rounding conducted, pt encourages to call for any assistance     Problem: Pain  Goal: Patient's pain/discomfort is manageable  Outcome: Progressing  Pt c/o 3/10 headache mostly when coughing, scheduled toradol given, pt refuses prn meds    Problem: Inadequate Gas Exchange  Goal: Adequately oxygenating and ventilation is improved  Outcome: Progressing  Pt on 5.5 L of oxygen via NC, O2 sat in low 90's, pt with strong non-productive coughs, SOB with exertion, ABG ordered but pt refused       Problem: Infection/Potential for Infection  Goal: Free from infection  Outcome: Progressing  Stable VS, scheduled IV abx given

## 2015-09-02 NOTE — Progress Notes (Signed)
Patient: [name]  Date: 09/02/2015   LOS: 4 Days  Admission Date: 08/29/2015   MRN: [id number]  Attending: Krystal Eaton MD     ASSESSMENT/PLAN     Arvis Miguez is a 55 y.o. female admitted with Fever/cough    Assessment and Plan:    1. Severe Sepsis on arrival: influenza B pna and also posible is  Superimposed  Bacterial pneumonia.   -Urine Legionella and strep antigens negative  -has been on  CAP tx with  Ceftriaxone, Zithro and started on  Tamiflu as well.    -Not showing expectant recovery.  mrsa screen ordered yesterday and patient did receive a dose of vancomycin. ID on board  - CT yesterday noted with multifocal bilateral infiltrate.   -as not better broaden abx? Will d/w ID    2. acute Hypoxic resp failure sec to #1 likely  -Persistent hypoxia.  As above  -in addition, order standing DuoNeb's every 4 hours while awake, order ABG, BNP, echocardiogram and stop fluids as she has some puffy feet to eval for ? Underlying chf(less likely but possible)  -Continue O2 supplement, pulm consult.     3. Mild thrombocytopenia probably from Infection  resolved    4. GERD on PPI    5. Headache has hx of Migraine HA (follows with Dr Hamilton Capri)  Better. Appreciate neuro    6. Hypokalemia replete again today      Nutrition: Regular Diet    GI Prophylaxis: PPI    DVT Prophylaxis: Lovenox 40 mg subQ daily       Code Status: full code    DISPO:   pending    Safety Checklist  Foley: Not present   IVs:  Peripheral IV   PT/OT: Not needed   Daily CBC & or Chem ordered:   Yes                 SUBJECTIVE     Jocilynn Grade states she feels a Little Better.  Still Short of Breath and Unable to Take Deep Breaths.  She Has a Dry Hacking Cough.  No Abdominal Pain, Nausea, Vomiting.  No Fever.  States She had a little improved appetite today    MEDICATIONS     Current Facility-Administered Medications   Medication Dose Route Frequency   . albuterol-ipratropium  3 mL Nebulization Q4H WA   . cefTRIAXone  1 g  Intravenous Q24H SCH    And   . azithromycin  500 mg Intravenous Q24H SCH   . benzonatate  100 mg Oral TID   . dextromethorphan polistirex ER  30 mg Oral Q12H SCH   . enoxaparin  40 mg Subcutaneous Daily   . ketorolac  30 mg Intravenous 4 times per day   . oseltamivir  75 mg Oral Q12H SCH   . pantoprazole  40 mg Oral BID AC       ROS     Remainder of 10 point ROS as above or otherwise negative    PHYSICAL EXAM     Filed Vitals:    09/02/15 1235   BP: 133/82   Pulse: 94   Temp: 97 F (36.1 C)   Resp: 18   SpO2: 91%       Temperature: Temp  Min: 95.8 F (35.4 C)  Max: 98 F (36.7 C)  Pulse: Pulse  Min: 72  Max: 98  Respiratory: Resp  Min: 17  Max: 44  Non-Invasive BP: BP  Min: 126/64  Max:  140/83  Pulse Oximetry SpO2  Min: 78 %  Max: 92 %    Intake and Output Summary (Last 24 hours) at Date Time    Intake/Output Summary (Last 24 hours) at 09/02/15 1236  Last data filed at 09/02/15 0935   Gross per 24 hour   Intake    120 ml   Output      0 ml   Net    120 ml     In Mod pain  GEN APPEARANCE: Normal;  A&OX3  HEENT: EOMI;   NECK: Supple;   CVS: RRR, S1, S2; No M/G/R  LUNGS: scattered crackles, diminished bs b/l.  No rales  ABD: Soft; No TTP; + Normoactive BS  EXT: 1+ pedal edema;   SKIN: No rash or Lesions  NEURO:  No Focal neurological deficits      LABS       Recent Labs  Lab 09/02/15  0701 09/01/15  0621 08/31/15  0536   WBC 11.09* 10.30 10.76   RBC 3.86* 4.25 3.93*   HGB 12.1 13.1 12.3   HEMATOCRIT 35.2* 39.7 37.1   MCV 91.2 93.4 94.4   PLATELETS 257 196 150         Recent Labs  Lab 09/02/15  0701 09/01/15  0621 08/31/15  0649 08/30/15  0634 08/29/15  0853   SODIUM 144 144 146* 141 137   POTASSIUM 3.1* 3.3* 4.2 3.8 3.6   CHLORIDE 111 112* 116* 113* 103   CO2 24 22 27 24 24    BUN 8 8 9 12 18    CREATININE 0.8 0.8 0.8 0.8 1.0   GLUCOSE 77 76 97 105* 123*   CALCIUM 7.8* 8.4* 8.1* 7.3* 8.6   MAGNESIUM 1.7 1.9 1.7  --   --          Recent Labs  Lab 08/29/15  0853   ALT 29   AST (SGOT) 50*   BILIRUBIN, TOTAL 0.5    BILIRUBIN, DIRECT 0.2   ALBUMIN 4.2   ALKALINE PHOSPHATASE 58         Recent Labs  Lab 08/29/15  0853   TROPONIN I <0.01             Microbiology Results     Procedure Component Value Units Date/Time    Blood culture #1 (Aerobic / Anaerobic) [[id number]] Collected:  08/29/15 0851    Specimen Information:  Arm from Blood Updated:  08/29/15 1516    Narrative:      RAC  1 BLUE+1 PURPLE    Blood culture #2  (Aerobic / Anaerobic) [[id number]] Collected:  08/29/15 0859    Specimen Information:  Arm from Blood Updated:  08/29/15 1516    Narrative:      Rhand  1 BLUE+1 PURPLE    Legionella antigen, urine [[id number]] Collected:  08/29/15 1927    Specimen Information:  Urine from Urine, Clean Catch Updated:  08/30/15 0513    Narrative:      ORDER#: [id number]                                    ORDERED BY: Loleta Dicker  SOURCE: Urine, Clean Catch                           COLLECTED:  08/29/15 19:27  ANTIBIOTICS AT COLL.:  RECEIVED :  08/30/15 00:37  Legionella, Rapid Urinary Antigen          FINAL       08/30/15 05:13  08/30/15   Negative for Legionella pneumophila Serogroup 1 Antigen             Limitations of Test:             1. Negative results do not exclude infection with Legionella                pneumophila Serogroup 1.             2. Does not detect other serogroups of L. pneumophila                or other Legionella species.             Test Reference Range: Negative      Rapid influenza A/B antigens [[id number]] Collected:  08/29/15 0853    Specimen Information:  Nasopharyngeal from Nasal Aspirate Updated:  08/29/15 0913    Narrative:      ORDER#: [id number]                                    ORDERED BY: Cari Caraway  SOURCE: Nasal Aspirate                               COLLECTED:  08/29/15 08:53  ANTIBIOTICS AT COLL.:                                RECEIVED :  08/29/15 08:59  Influenza Rapid Antigen A&B                FINAL       08/29/15 09:13   +  08/29/15   Positive for Influenza  B and Negative for Influenza A             Reference Range: Negative      STREP PNEUMONIA, RAPID URINARY ANTIGEN [[id number]] Collected:  08/29/15 1927    Specimen Information:  Urine, Clean Catch Updated:  08/30/15 0513    Narrative:      ORDER#: [id number]                                    ORDERED BY: Loleta Dicker  SOURCE: Urine, Clean Catch                           COLLECTED:  08/29/15 19:27  ANTIBIOTICS AT COLL.:                                RECEIVED :  08/30/15 00:37  S. pneumoniae, Rapid Urinary Antigen       FINAL       08/30/15 05:12  08/30/15   Negative for Streptococcus pneumoniae Urinary Antigen             Note:             This is a presumptive test for the direct qualitative             detection of bacterial antigen.  This test is not intended as             a substitute for a gram stain and bacterial culture. Samples             with extremely low levels of antigen may yield negative             results.             Reference Range: Negative      Urine culture [[id number]] Collected:  08/29/15 1032    Specimen Information:  Urine from Urine, Clean Catch Updated:  08/30/15 1222    Narrative:      ORDER#: [id number]                                    ORDERED BY: Cari Caraway  SOURCE: Urine, Clean Catch                           COLLECTED:  08/29/15 10:32  ANTIBIOTICS AT COLL.:                                RECEIVED :  08/29/15 14:21  Culture Urine                              FINAL       08/30/15 12:22  08/30/15   No growth of >1,000 CFU/ML, No further work             RADIOLOGY     Radiological Procedure reviewed.    CT Chest WO Contrast   Final Result       1. Diffuse bilateral airspace disease greatest in the upper lobes   concerning for pneumonia. Superimposed edema not excluded. Follow-up for   resolution is recommended.   2. Small bilateral pleural effusions.   3. 6 mm left lower lobe pulmonary nodule.   4. Right thyroid nodule.      Kennyth Lose, MD    09/01/2015 6:12 PM         Chest AP  Portable   Final Result         Right upper lobe infiltrate concerning for pneumonia      J. Carole Binning, MD    08/29/2015 8:41 AM         Echocardiogram Adult Complete W Clr/ Dopp Waveform    (Results Pending)       Signed,  Krystal Eaton MD  12:36 PM 09/02/2015

## 2015-09-02 NOTE — Consults (Signed)
[facility] Consultation Progress Note    Date Time: 09/02/2015 9:35 PM  Patient Name: [facility]  Outpatient Neurologist :    CC:       Assessment:     Patient Active Problem List   Diagnosis   . Synovial cyst   . Stenosis, spinal, lumbar   . Pneumonia       Plan:     Fioricet/Motrin as needed for headaches  Continue antibiotics as per ID/Primary team  Supportive/symptomatic  care    Interval History/Subjective:     55 years old female admitted with respiratory distress/pneumonia.  Headaches are better, she has not required any breakthrough headache medication today.  She continues to have SOB, and cough, no headache with cough noted as it was 2 days ago.  Will continue as needed Fioricet/NSAIDs for headaches.    Medications:     Current Facility-Administered Medications   Medication Dose Route Frequency   . albuterol-ipratropium  3 mL Nebulization Q4H WA   . azithromycin  500 mg Intravenous Q24H SCH   . benzonatate  100 mg Oral TID   . ceftaroline fosamil  600 mg Intravenous Q12H SCH   . dextromethorphan polistirex ER  30 mg Oral Q12H SCH   . enoxaparin  40 mg Subcutaneous Daily   . ketorolac  30 mg Intravenous 4 times per day   . oseltamivir  75 mg Oral Q12H SCH   . pantoprazole  40 mg Oral BID AC   . potassium chloride  40 mEq Oral BID       Review of Systems:   Neurological ROS: All other systems were reviewed and are negative except as previously note din the HPI.      Physical Exam:   Temp:  [97 F (36.1 C)-100.6 F (38.1 C)] 97.7 F (36.5 C)  Heart Rate:  [72-112] 97  Resp Rate:  [17-44] 17  BP: (131-145)/(63-84) 131/63 mmHg    Mental Status: The patient was awake, alert, and oriented. Conversation   was appropriate. Speech was slow, and the patient followed commands   consistently.  Cranial Nerves: Pupils were equally round and reactive to light. Extraocular  movements were intact, without nystagmus. The patient's face was symmetric,   and facial sensation was intact. Tongue and palate were  midline.   Motor Exam: The patient had generalized weakness. Tone and bulk appeared   normal. There were no abnormal movements or tremor noted.   Sensation: Light touch grossly intact intact.   Gait: Deferred.  Deep Tendon Reflexes: 2+ and symmetric throughout.       Labs:     Results     Procedure Component Value Units Date/Time    MRSA culture [[id number]] Collected:  09/01/15 1250    Specimen Information:  Body Fluid from Nasal/Throat ASC Admission Updated:  09/02/15 1930    Narrative:      ORDER#: [id number]                                    ORDERED BY: Garret Reddish  SOURCE: Nares and Throat                             COLLECTED:  09/01/15 12:50  ANTIBIOTICS AT COLL.:  RECEIVED :  09/01/15 21:41  Culture MRSA Surveillance                  FINAL       09/02/15 19:29  09/02/15   Negative for Methicillin Resistant Staph aureus from Nares and             Negative for Methicillin Resistant Staph aureus from Throat      Blood culture #2  (Aerobic / Anaerobic) [[id number]] Collected:  08/29/15 0859    Specimen Information:  Arm from Blood Updated:  09/02/15 1521    Narrative:      ORDER#: [id number]                                    ORDERED BY: Cari Caraway  SOURCE: Blood arm                                    COLLECTED:  08/29/15 08:59  ANTIBIOTICS AT COLL.:                                RECEIVED :  08/29/15 15:16  Culture Blood Aerobic and Anaerobic        PRELIM      09/02/15 15:21  08/30/15   No Growth after 1 day/s of incubation.  08/31/15   No Growth after 2 day/s of incubation.  09/01/15   No Growth after 3 day/s of incubation.  09/02/15   No Growth after 4 day/s of incubation.      Blood culture #1 (Aerobic / Anaerobic) [[id number]] Collected:  08/29/15 0851    Specimen Information:  Arm from Blood Updated:  09/02/15 1521    Narrative:      ORDER#: [id number]                                    ORDERED BY: Cari Caraway  SOURCE: Blood arm                                     COLLECTED:  08/29/15 08:51  ANTIBIOTICS AT COLL.:                                RECEIVED :  08/29/15 15:16  Culture Blood Aerobic and Anaerobic        PRELIM      09/02/15 15:21  08/30/15   No Growth after 1 day/s of incubation.  08/31/15   No Growth after 2 day/s of incubation.  09/01/15   No Growth after 3 day/s of incubation.  09/02/15   No Growth after 4 day/s of incubation.      B-type Natriuretic Peptide [[id number]]  (Abnormal) Collected:  09/02/15 1254     B-Natriuretic Peptide 328 (H) pg/mL Updated:  09/02/15 1327    Basic Metabolic Panel [[id number]]  (Abnormal) Collected:  09/02/15 0701    Specimen Information:  Blood Updated:  09/02/15 0759     Glucose 77 mg/dL      BUN 8 mg/dL      Creatinine 0.8 mg/dL  Calcium 7.8 (L) mg/dL      Sodium 696 mEq/L      Potassium 3.1 (L) mEq/L      Chloride 111 mEq/L      CO2 24 mEq/L      Anion Gap 9.0     Magnesium [[id number]] Collected:  09/02/15 0701    Specimen Information:  Blood Updated:  09/02/15 0759     Magnesium 1.7 mg/dL     GFR [[id number]] Collected:  09/02/15 0701     EGFR >60.0 Updated:  09/02/15 0759    Sedimentation rate (ESR) [[id number]]  (Abnormal) Collected:  09/02/15 0701    Specimen Information:  Blood Updated:  09/02/15 0758     Sed Rate 35 (H) mm/Hr     CBC without differential [[id number]]  (Abnormal) Collected:  09/02/15 0701    Specimen Information:  Blood from Blood Updated:  09/02/15 0738     WBC 11.09 (H) x10 3/uL      Hgb 12.1 g/dL      Hematocrit 56.4 (L) %      Platelets 257 x10 3/uL      RBC 3.86 (L) x10 6/uL      MCV 91.2 fL      MCH 31.3 pg      MCHC 34.4 g/dL      RDW 14 %      MPV 10.4 fL      Nucleated RBC 0 /100 WBC           Rads:   Ct Chest Wo Contrast    09/01/2015   1. Diffuse bilateral airspace disease greatest in the upper lobes concerning for pneumonia. Superimposed edema not excluded. Follow-up for resolution is recommended. 2. Small bilateral pleural effusions. 3. 6 mm left lower lobe pulmonary nodule. 4. Right thyroid  nodule. Kennyth Lose, MD 09/01/2015 6:12 PM     Chest Ap Portable    08/29/2015  Right upper lobe infiltrate concerning for pneumonia J. Carole Binning, MD 08/29/2015 8:41 AM         Signed by:  Lynann Bologna

## 2015-09-02 NOTE — Plan of Care (Signed)
Problem: Pain  Goal: Patient's pain/discomfort is manageable  Outcome: Progressing  On scheduled toradol for c/o head ache and generalized body ache with good effect.    Problem: Ineffective Airway Clearance  Goal: Airway is maintained  Outcome: Progressing  Pt with cough. On scheduled tessalon pearls and dexomethorphan for cough. Pt encouraged to take deep breaths. Oxygen at 5.5 liters via nasal cannula this AM to keep SAO2 >90%.

## 2015-09-02 NOTE — Progress Notes (Signed)
Multiple attempts to meet with patient. Currently she is attempting to get up to BR with assistance. Strong cough, still on high flow oxygen. CM will continue to follow for [place] needs.

## 2015-09-02 NOTE — Progress Notes (Addendum)
INFECTIOUS DISEASE PROGRESS NOTE       Antibiotics:    Ceftriaxone, Azithromycin #5  Tamiflu #5    Lines:    PIV    Assessment:    55 y/o female with history of migraines, GERD    Admitted with fever, cough, SOB, chest pain, headaches  Influenza B  CXR with RUL pneumonia  CT chest  Shows diffuse b/l airspace disease concerning for PNA, small b/l pleura effusions, 6mm LLL pulmonary nodule, R thyroid nodule    Afebrile  WBC 11.09  Hypoxic, 5.5L NC    Plan:    - Medical management per Primary service  - CAP, influenza B positive; continue current antibiotics and antiviral for now.  - MRSA screen pending, will await findings.  - Blood cultures negative to date, will continue to monitor.    Discussed with patient.      Emmanuella M. Cresenciano Lick, DPM  [facility] [place], [place]- 1  Foot & Ankle Surgery  Pager [phone]    The patient was seen and examined with the resident.  She continues to have significant difficulties with breathing.  She has multilobar pulmonary infiltrates.  No sputum culture collected for culture.  MRSA swab is pending.  We will switch to ceftriaxone.  to  Ceftaroline 600 mg IV every 12 hours.  This will provide MRSA coverage pending.  The MRSA swab.  She will take one more day of azithromycin.  She does note that she is feeling a bit better today and her appetite is improving.  I think that we need to just stay the course and follow.  Fevers are down.  Minimal leukocytosis.    Donald Prose, M.D.  [facility], [facility]  Pager [phone]  [phone]      Subjective:    Patient resting in bed, appears chronically fatigued.    Reports not feeling much improved, coughing fits results in headaches.  Fioricet and Toradol offering moderate relief thus far.  Tolerating antibiotics well, no side effects.    Denies any  Nausea, vomiting or diarrhea. Endorses chest and SOB with coughing.      Physical Exam:      Filed Vitals:    09/02/15 0753   BP: 140/83   Pulse: 91   Temp: 97.3 F (36.3 C)    Resp: 24   SpO2: 92%   Vitals reviewed. Afebrile.  General: awake, alert, ill-appearing female; uncomfortable from coughing; distressed  HEENT: anicteric sclera; oropharynx clear; no nuchal rigidity  Heart: RRR  Lungs: right upper field crackles;+ coughing  Abdomen: soft, NT, ND, +BS  Extremities: no edema; no rash  Lines: PIV    Labs:    Lab results reviewed.  Recent Labs      09/02/15   0701   WBC  11.09*   HGB  12.1   HEMATOCRIT  35.2*   PLATELETS  257     Recent CMP   Recent Labs      09/02/15   0701   GLUCOSE  77   BUN  8   CREATININE  0.8   SODIUM  144   POTASSIUM  3.1*   CHLORIDE  111   CO2  24       Microbiology:    All microbiology results reviewed.   5/12 blood cx: NGTD  5/12 urine cx: negative  5/12 urine strep/legionella Ags negative  5/12 rapid flu +influenza B    Radiology:    All radiological procedures reviewed.   Radiology Results (  24 Hour)     Procedure Component Value Units Date/Time    CT Chest WO Contrast [[id number]] Collected:  09/01/15 1806    Order Status:  Completed Updated:  09/01/15 1816    Narrative:      CLINICAL HISTORY: Pneumonia, persistent hypoxemia.    TECHNIQUE: CT of the chest was performed without intravenous contrast.  The following dose reduction techniques were utilized: automated  exposure control and/or adjustment of the mA and/or kV according  to patient size, and the use of iterative reconstruction technique.     FINDINGS: Diffuse groundglass and consolidative airspace opacities  involving all lobes, greatest in the upper lobes. Findings may represent  pneumonia. Superimposed edema not excluded. 6 mm pulmonary nodule in the  left lower lobe (image 26, series 3). Small bilateral pleural effusions,  right greater than left. No significant pericardial effusion. Mild  mediastinal adenopathy. A right paratracheal lymph node in the superior  mediastinum measures 1 x 1.4 cm. Limited hilar evaluation due to lack of  intravenous contrast. No axillary adenopathy. Calcified right  thyroid  nodule measuring 1.2 cm. Degenerative changes in the spine.      Impression:         1. Diffuse bilateral airspace disease greatest in the upper lobes  concerning for pneumonia. Superimposed edema not excluded. Follow-up for  resolution is recommended.  2. Small bilateral pleural effusions.  3. 6 mm left lower lobe pulmonary nodule.  4. Right thyroid nodule.    Kennyth Lose, MD   09/01/2015 6:12 PM              Emmanuella M. Cresenciano Lick, DPM  [facility], [place]- 1  Foot & Ankle Surgery  Pager [phone]

## 2015-09-03 ENCOUNTER — Inpatient Hospital Stay: Payer: No Typology Code available for payment source

## 2015-09-03 DIAGNOSIS — J189 Pneumonia, unspecified organism: Secondary | ICD-10-CM | POA: Diagnosis present

## 2015-09-03 LAB — BASIC METABOLIC PANEL
Anion Gap: 6 (ref 5.0–15.0)
BUN: 6 mg/dL — ABNORMAL LOW (ref 7–19)
CO2: 27 mEq/L (ref 22–29)
Calcium: 7.9 mg/dL — ABNORMAL LOW (ref 8.5–10.5)
Chloride: 110 mEq/L (ref 100–111)
Creatinine: 0.7 mg/dL (ref 0.6–1.0)
Glucose: 125 mg/dL — ABNORMAL HIGH (ref 70–100)
Potassium: 3 mEq/L — ABNORMAL LOW (ref 3.5–5.1)
Sodium: 143 mEq/L (ref 136–145)

## 2015-09-03 LAB — CBC WITH MANUAL DIFFERENTIAL
Band Neutrophils Absolute: 0.34 10*3/uL (ref 0.00–1.00)
Band Neutrophils: 4 %
Basophils Absolute Manual: 0 10*3/uL (ref 0.00–0.20)
Basophils Manual: 0 %
Cell Morphology: NORMAL
Eosinophils Absolute Manual: 0.09 10*3/uL (ref 0.00–0.70)
Eosinophils Manual: 1 %
Hematocrit: 33.3 % — ABNORMAL LOW (ref 37.0–47.0)
Hgb: 11.5 g/dL — ABNORMAL LOW (ref 12.0–16.0)
Lymphocytes Absolute Manual: 0.69 10*3/uL (ref 0.50–4.40)
Lymphocytes Manual: 8 %
MCH: 31.1 pg (ref 28.0–32.0)
MCHC: 34.5 g/dL (ref 32.0–36.0)
MCV: 90 fL (ref 80.0–100.0)
MPV: 10 fL (ref 9.4–12.3)
Monocytes Absolute: 0.26 10*3/uL (ref 0.00–1.20)
Monocytes Manual: 3 %
Myelocytes Absolute: 0.26 10*3/uL — ABNORMAL HIGH
Myelocytes: 3 %
Neutrophils Absolute Manual: 6.97 10*3/uL (ref 1.80–8.10)
Nucleated RBC: 0 /100 WBC (ref 0–1)
Platelet Estimate: NORMAL
Platelets: 311 10*3/uL (ref 140–400)
RBC: 3.7 10*6/uL — ABNORMAL LOW (ref 4.20–5.40)
RDW: 13 % (ref 12–15)
Segmented Neutrophils: 81 %
WBC: 8.61 10*3/uL (ref 3.50–10.80)

## 2015-09-03 LAB — C-REACTIVE PROTEIN: C-Reactive Protein: 21.1 mg/dL — ABNORMAL HIGH (ref 0.0–0.8)

## 2015-09-03 LAB — GFR: EGFR: >60

## 2015-09-03 LAB — GLUCOSE WHOLE BLOOD - POCT: Whole Blood Glucose POCT: 88 mg/dL (ref 70–100)

## 2015-09-03 MED ORDER — FUROSEMIDE 10 MG/ML IJ SOLN
20.0000 mg | Freq: Once | INTRAMUSCULAR | Status: AC
Start: 2015-09-03 — End: 2015-09-03
  Administered 2015-09-03: 20 mg via INTRAVENOUS
  Filled 2015-09-03: qty 2

## 2015-09-03 MED ORDER — SODIUM CHLORIDE 0.9 % IV MBP
2.0000 g | INTRAVENOUS | Status: AC
Start: 2015-09-03 — End: 2015-09-07
  Administered 2015-09-03 – 2015-09-07 (×5): 2 g via INTRAVENOUS
  Filled 2015-09-03 (×6): qty 2000

## 2015-09-03 MED ORDER — POTASSIUM CHLORIDE CRYS ER 20 MEQ PO TBCR
40.0000 meq | EXTENDED_RELEASE_TABLET | Freq: Two times a day (BID) | ORAL | Status: AC
Start: 2015-09-03 — End: 2015-09-04
  Administered 2015-09-03 – 2015-09-04 (×2): 40 meq via ORAL
  Filled 2015-09-03 (×2): qty 2

## 2015-09-03 MED ORDER — SODIUM CHLORIDE 0.9 % IV MBP
2.0000 g | INTRAVENOUS | Status: DC
Start: 2015-09-03 — End: 2015-09-03
  Filled 2015-09-03: qty 2000

## 2015-09-03 NOTE — Progress Notes (Signed)
INFECTIOUS DISEASE DAILY PROGRESS NOTE        Date Time 09/03/2015 10:53 AM  Patient Name: [name], [name]      Assessment:   Ceftaroline #1.  Total antibiotics #6  Azithromycin #6.  Tamiflu 10 doses.  Completed.    55 year old female with a history of migraines and gastroesophageal reflux disease.  Admitted with fever, cough and shortness of breath.  Influenza B positive.  She has completed a 5 day course of Tamiflu.    Diffuse multilobar bilateral pneumonia.  Hypoxia.  MRSA swab is negative.    Plan:   The patient notes that she is feeling better today.  She has more energy and her appetite is improving.  She notes that for the past 2 days.  She has improved gradually.  She still has fits of coughing.  She is still having some intermittent fevers, which I suspect is all inflammatory.  No evidence of MRSA.  Discontinue Ceftaroline and switch back to ceftriaxone 2 g IV daily.  We will discontinue azithromycin.  After today's dose.  She has been seen by pulmonary, Dr. Claudia Desanctis.  Started on Lasix for diuresis.    At this time, I think we need to stay the course and give her time.  I think that she is gradually getting better.  I do believe that she has significant amount of inflammation in her lungs.    Discontinue respiratory droplet isolation.  Discussed with Dr Jacques Navy  Subjective:   She notes that she is feeling better today.  She wants to get up and ambulate.      Medications:     Current Facility-Administered Medications   Medication Dose Route Frequency   . albuterol-ipratropium  3 mL Nebulization Q4H WA   . azithromycin  500 mg Intravenous Q24H SCH   . benzonatate  100 mg Oral TID   . ceftaroline fosamil  600 mg Intravenous Q12H SCH   . dextromethorphan polistirex ER  30 mg Oral Q12H SCH   . enoxaparin  40 mg Subcutaneous Daily   . ketorolac  30 mg Intravenous 4 times per day   . pantoprazole  40 mg Oral BID AC       Past Medical History   Diagnosis Date   . PONV (postoperative nausea and vomiting)     . GERD (gastroesophageal reflux disease)    . Arthritis      THUMB   . Rash      PSORIASIS   . Headache(784.0)      MIGRAINES, headache today   . Low back pain      spinal stenosis, right sided pain, no neuro deficits   . 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 Varicose veins      [facility]   . Scoliosis      slight curvature-noted by orthopedist a many years ago   . Anemia      with pregnancy         Review of Systems:   Headache is improved.  No sore throat.  Nausea is resolved.  Still with occasional coughing fits.  Shortness of breath.  This improved.  Appetite is improved.  No urinary complaints.  No myalgias or arthralgias.  No rash    Physical Exam:     BP 138/84 mmHg  Pulse 107  Temp(Src) 96.3 F (35.7 C) (Oral)  Resp 22  Ht 1.702 m (5'  7")  Wt 64.864 kg (143 lb)  BMI 22.39 kg/m2  SpO2 92%  LMP 09/18/2010   Temp (24hrs), Avg:98.6 F (37 C), Min:96.3 F (35.7 C), Max:100.7 F (38.2 C)       She looks a bit better.  Anicteric.  Throat clear.  Lungs with diminished breath sounds both upper lung fields.  Occasional scattered crackle.  Heart tachycardic  Abdomen soft and nontender.  Mild generalized edema.  No rash    Labs:     Results     Procedure Component Value Units Date/Time    ANCA Screen W/Reflex to Titer [[id number]] Collected:  09/03/15 1009     Updated:  09/03/15 1047    C Reactive Protein [[id number]] Collected:  09/03/15 1009    Specimen Information:  Blood Updated:  09/03/15 1047    CBC WITH MANUAL DIFFERENTIAL [[id number]]  (Abnormal) Collected:  09/03/15 0831     WBC 8.61 x10 3/uL Updated:  09/03/15 1010     Hgb 11.5 (L) g/dL      Hematocrit 74.2 (L) %      Platelets 311 x10 3/uL      RBC 3.70 (L) x10 6/uL      MCV 90.0 fL      MCH 31.1 pg      MCHC 34.5 g/dL      RDW 13 %      MPV 10.0 fL      Segmented Neutrophils 81 %      Band Neutrophils 4 %      Lymphocytes Manual 8 %      Monocytes Manual 3 %      Eosinophils Manual 1 %       Basophils Manual 0 %      Myelocytes 3 %      Nucleated RBC 0 /100 WBC      Abs Seg Manual 6.97 x10 3/uL      Bands Absolute 0.34 x10 3/uL      Absolute Lymph Manual 0.69 x10 3/uL      Monocytes Absolute 0.26 x10 3/uL      Absolute Eos Manual 0.09 x10 3/uL      Absolute Baso Manual 0.00 x10 3/uL      Absolute Myelocyte 0.26 (H) x10 3/uL      Cell Morphology: Normal      Platelet Estimate Normal     ANA SCREEN REFLEX [[id number]] Collected:  09/03/15 1009     Updated:  09/03/15 1010    Basic Metabolic Panel [[id number]]  (Abnormal) Collected:  09/03/15 0831    Specimen Information:  Blood Updated:  09/03/15 0940     Glucose 125 (H) mg/dL      BUN 6 (L) mg/dL      Creatinine 0.7 mg/dL      Calcium 7.9 (L) mg/dL      Sodium 416 mEq/L      Potassium 3.0 (L) mEq/L      Chloride 110 mEq/L      CO2 27 mEq/L      Anion Gap 6.0     GFR [[id number]] Collected:  09/03/15 0831     EGFR >60.0 Updated:  09/03/15 0940    MRSA culture [[id number]] Collected:  09/01/15 1250    Specimen Information:  Body Fluid from Nasal/Throat ASC Admission Updated:  09/02/15 1930    Narrative:      ORDER#: [id number]  ORDERED BY: BAGASRA, ALEXAN  SOURCE: Nares and Throat                             COLLECTED:  09/01/15 12:50  ANTIBIOTICS AT COLL.:                                RECEIVED :  09/01/15 21:41  Culture MRSA Surveillance                  FINAL       09/02/15 19:29  09/02/15   Negative for Methicillin Resistant Staph aureus from Nares and             Negative for Methicillin Resistant Staph aureus from Throat      Blood culture #2  (Aerobic / Anaerobic) [[id number]] Collected:  08/29/15 0859    Specimen Information:  Arm from Blood Updated:  09/02/15 1521    Narrative:      ORDER#: [id number]                                    ORDERED BY: Cari Caraway  SOURCE: Blood arm                                    COLLECTED:  08/29/15 08:59  ANTIBIOTICS AT COLL.:                                RECEIVED :  08/29/15  15:16  Culture Blood Aerobic and Anaerobic        PRELIM      09/02/15 15:21  08/30/15   No Growth after 1 day/s of incubation.  08/31/15   No Growth after 2 day/s of incubation.  09/01/15   No Growth after 3 day/s of incubation.  09/02/15   No Growth after 4 day/s of incubation.      Blood culture #1 (Aerobic / Anaerobic) [[id number]] Collected:  08/29/15 0851    Specimen Information:  Arm from Blood Updated:  09/02/15 1521    Narrative:      ORDER#: [id number]                                    ORDERED BY: Cari Caraway  SOURCE: Blood arm                                    COLLECTED:  08/29/15 08:51  ANTIBIOTICS AT COLL.:                                RECEIVED :  08/29/15 15:16  Culture Blood Aerobic and Anaerobic        PRELIM      09/02/15 15:21  08/30/15   No Growth after 1 day/s of incubation.  08/31/15   No Growth after 2 day/s of incubation.  09/01/15   No Growth after 3 day/s of incubation.  09/02/15   No Growth after 4 day/s of incubation.  B-type Natriuretic Peptide [[id number]]  (Abnormal) Collected:  09/02/15 1254     B-Natriuretic Peptide 328 (H) pg/mL Updated:  09/02/15 1327          Rads:     Radiology Results (24 Hour)     Procedure Component Value Units Date/Time    XR Chest AP Portable [[id number]]     Order Status:  Sent Updated:  09/03/15 1033              Signed by: [place] page [id number]  [facility], [facility]  [phone]

## 2015-09-03 NOTE — UM Notes (Signed)
PATIENT NAME: [name],[name]   DOB: 1960-07-28   CONTINUED STAY REVIEW FOR: 09/02/2015 to 09/03/2015   NOTES TO REVIEWER:    This clinical review is based on/compiled from documentation provided by the treatment team within the patient's medical record.   UTILIZATION REVIEW CONTACT: Cherly Anderson. Barnie Alderman, RN, BSN  Clinical Case Manager  - Utilization Review  [facility]    [address]    [place], [place] [address]  NPI: [id number]  Tax ID: [id number]  Phone: [phone]  Fax: [phone]    Please use fax number [phone] to provide authorization for hospital services or to request additional information.        09/02/2015   Continues on Telemetry Unit, Q4H VS.    ID Notes:  - CAP, influenza B positive; continue current antibiotics and antiviral for now.  - MRSA screen pending, will await findings.    She continues to have significant difficulties with breathing. She has multilobar pulmonary infiltrates. No sputum culture collected for culture.  MRSA swab is pending.  We will switch to ceftriaxone. to Ceftaroline 600 mg IV every 12 hours. This will provide MRSA coverage pending. The MRSA swab.  She will take one more day of azithromycin.  I think that we need to just stay the course and follow.    Attending Notes:   1. Severe Sepsis on arrival: influenza B pna and also posible is Superimposed Bacterial pneumonia.   -Urine Legionella and strep antigens negative  -has been on CAP tx with Ceftriaxone, Zithro and started on Tamiflu as well.   -Not showing expectant recovery. mrsa screen ordered yesterday and patient did receive a dose of vancomycin. ID on board  - CT yesterday noted with multifocal bilateral infiltrate.   -as not better broaden abx? Will d/w ID    2. acute Hypoxic resp failure sec to #1 likely  -Persistent hypoxia. As above  -in addition, order standing DuoNeb's every 4 hours while awake, order ABG, BNP, echocardiogram and stop fluids as she has some puffy feet  to eval for ? Underlying chf(less likely but possible)  -Continue O2 supplement, pulm consult.     3. Mild thrombocytopenia probably from Infection  resolved    4. GERD on PPI    5. Headache has hx of Migraine HA (follows with Dr Hamilton Capri)  Better. Appreciate neuro    6. Hypokalemia replete again today LABS:    09/02/2015 07:01   WBC 11.09 (H)   Hematocrit 35.2 (L)   RBC 3.86 (L)   Potassium 3.1 (L)   Calcium 7.8 (L)   Sed Rate 35 (H)      MEDS:  Duo-Neb 2.5-0.5mg  Neb Q4H   Teflaro 600mg  IV Q12H   Rocephin 1gm IV Q24H   Zithromax 500mg  IV Q24H   Lovenox 40mg  SQ QD   Toradol 30mg  IV Q6H   Tamiflu 75mg  Q12H   Protonix 40mg  BID   Potassium Chloride BID     Tylenol 650mg  Q4H PRN x 1  IMAGING:  None     09/03/2015   Continues on Telemetry Unit, Q4H VS.     Temp:  [96.3 F (35.7 C)-100.7 F (38.2 C)] 96.9 F (36.1 C)  Heart Rate:  [91-112] 107  Resp Rate:  [17-24] 22  BP: (131-149)/(63-84) 135/68 mmHg    Pulmonary Notes:   Pneumonia: The patient presents with influenza B infection and worsening condition consistent with a superimposed bacterial infection. She is stabilized but still remains  fairly dyspneic. Other possibilities could be an autoimmune or CHF. The patient does have an elevated BNP that has progressed since admission. Would consider doing a cardiac echocardiogram and doing gentle diuresis for the pulmonary edema. We will check an ANA, CRP, and ANCA, and for her hypoxemia, continue to keep her saturations greater than 92%. Bronchodilators and with pulmonary hygiene as you are doing. Infectious disease (ID) is following, she is on broad-spectrum antibiotics. She is MRSA negative.    ID Notes:   She still has fits of coughing.  She is still having some intermittent fevers, which I suspect is all inflammatory.  No evidence of MRSA.  Discontinue Ceftaroline and switch back to ceftriaxone 2 g IV daily.  We will discontinue azithromycin. After today's dose.  She has been seen by pulmonary, Dr.  Claudia Desanctis. Started on Lasix for diuresis.    At this time, I think we need to stay the course and give her time.    Attending Notes:  1. Severe Sepsis on arrival: influenza B pna and also posible is Superimposed Bacterial pneumonia.   -Urine Legionella and strep antigens negative  -was on CAP tx with Ceftriaxone, Zithro and started on Tamiflu(finished 5 day course)   -Switched to ceftaroline yesterday now back on ceftriaxone 2g/day  -slow to recover, thrill, low-grade fevers. MRSA screen negative  - CT 5/15noted with multifocal bilateral infiltrate.   -    2. acute Hypoxic resp failure sec to #1 likely  -Persistent hypoxia. As above  -in addition, now on standing DuoNeb's every 4 hours while awake, BNP slightly elevated, but echocardiogram without evidence for congestive heart failure. Did get a dose of Lasix,  -Continue O2 supplement,   -pulm consult appreciated. Inflammatory markers up    3. Mild thrombocytopenia probably from Infection  resolved    4. GERD on PPI    5. Headache has hx of Migraine HA   Better. Appreciate neuro    6. Hypokalemia replete prn LABS:    09/03/2015 08:31   Hemoglobin 11.5 (L)   Hematocrit 33.3 (L)   RBC 3.70 (L)   Absolute Myelocyte 0.26 (H)   Glucose 125 (H)   BUN 6 (L)   Potassium 3.0 (L)   Calcium 7.9 (L)      MEDS:  Duo-Neb 2.5-0.5mg  Neb Q4H   Teflaro 600mg  IV Q12H   Zithromax 500mg  IV Q24H   Rocephin 2gm IV Q24H   Lasix 20mg  IV x 1   Toradol 30mg  IV Q6H   Protonix 40mg  BID   Potassium Chloride BID     Tylenol 650mg  Q4H PRN x 1  IMAGING:  Echo  1. The quality of the study is technically adequate for interpretation.  2. The left ventricle is normal in size. Left ventricular systolic  function is normal. There are no discrete regional wall motion  abnormalities. Estimated EF is 65%. There is no left ventricular  hypertrophy. There is no Doppler evidence of reduced left ventricular compliance.  3. The left atrium is normal in size.  4. The aortic valve is  trileaflet. Normal valve excursion is present. There is no aortic insufficiency. No aortic stenosis is present. The aortic root is normal in size.  5. The mitral valve is structurally normal. Mild, centrally directed, mitral regurgitation is present.  6. The right ventricle is normal in size and contractility.  7. The right atrium is normal in size.  8. The tricuspid valve is structurally normal. There is trace tricuspid regurgitation.  9. The  pulmonic valve is structurally normal. There is trace pulmonic insufficiency.  10. There is no Doppler evidence of pulmonary hypertension.  11. No pericardial effusion, intracardiac thrombi, or masses are visualized.  12. The atrial septum is structurally normal, without shunt by color flow Doppler.  13. No prior studies are available for comparison.    CXR  Interval progression of bilateral interstitial opacities and basilar  opacities, and new left pleural effusion, which may reflect edema and/or infiltrate.

## 2015-09-03 NOTE — Progress Notes (Signed)
Patient: [name]  Date: 09/03/2015   LOS: 5 Days  Admission Date: 08/29/2015   MRN: [id number]  Attending: Krystal Eaton MD     ASSESSMENT/PLAN     [name] is a 55 y.o. female admitted with Fever/cough    Assessment and Plan:    1. Severe Sepsis on arrival: influenza B pna and also posible is  Superimposed  Bacterial pneumonia.   -Urine Legionella and strep antigens negative  -was on CAP tx with  Ceftriaxone, Zithro and started on  Tamiflu(finished 5 day course)    -Switched to ceftaroline yesterday now back on ceftriaxone 2g/day  -slow to recover, thrill, low-grade fevers.  MRSA screen negative  - CT 5/15noted with multifocal bilateral infiltrate.   -    2. acute Hypoxic resp failure sec to #1 likely  -Persistent hypoxia.  As above  -in addition, now on standing DuoNeb's every 4 hours while awake,  BNP slightly elevated, but echocardiogram without evidence for congestive heart failure.  Did get a dose of Lasix,  -Continue O2 supplement,   -pulm consult appreciated.  Inflammatory markers up    3. Mild thrombocytopenia probably from Infection  resolved    4. GERD on PPI    5. Headache has hx of Migraine HA (follows with Dr Hamilton Capri)  Better. Appreciate neuro    6. Hypokalemia replete prn      Nutrition: Regular Diet    GI Prophylaxis: PPI    DVT Prophylaxis: Lovenox 40 mg subQ daily       Code Status: full code    DISPO:   pending    Safety Checklist  Foley: Not present   IVs:  Peripheral IV   PT/OT: Not needed   Daily CBC & or Chem ordered:   Yes                 SUBJECTIVE     [name] states she feels better today.  She can walk a little bit further.  Still with cough, nonproductive.  Had low-grade temperature overnight.  Denies diarrhea, abdominal pain, nausea, vomiting    MEDICATIONS     Current Facility-Administered Medications   Medication Dose Route Frequency   . albuterol-ipratropium  3 mL Nebulization Q4H WA   . benzonatate  100 mg Oral TID   . cefTRIAXone  2 g  Intravenous Q24H SCH   . dextromethorphan polistirex ER  30 mg Oral Q12H SCH   . enoxaparin  40 mg Subcutaneous Daily   . ketorolac  30 mg Intravenous 4 times per day   . pantoprazole  40 mg Oral BID AC   . [DISCONTINUED] azithromycin  500 mg Intravenous Q24H SCH       ROS     Remainder of 10 point ROS as above or otherwise negative    PHYSICAL EXAM     Filed Vitals:    09/03/15 0755   BP: 138/84   Pulse: 107   Temp: 96.3 F (35.7 C)   Resp: 22   SpO2: 92%       Temperature: Temp  Min: 96.3 F (35.7 C)  Max: 100.7 F (38.2 C)  Pulse: Pulse  Min: 91  Max: 112  Respiratory: Resp  Min: 17  Max: 24  Non-Invasive BP: BP  Min: 131/63  Max: 149/84  Pulse Oximetry SpO2  Min: 90 %  Max: 92 %    Intake and Output Summary (Last 24 hours) at Date Time    Intake/Output Summary (  Last 24 hours) at 09/03/15 1202  Last data filed at 09/03/15 0800   Gross per 24 hour   Intake    120 ml   Output      0 ml   Net    120 ml     In Mod pain  GEN APPEARANCE: Normal;  A&OX3  HEENT: EOMI;   NECK: Supple;   CVS: tachy, S1, S2; No M/G/R  LUNGS: scattered crackles, diminished bs b/l still.  No rales  ABD: Soft; No TTP; + Normoactive BS  EXT: no pedal edema;   SKIN: No rash or Lesions  NEURO:  No Focal neurological deficits      LABS       Recent Labs  Lab 09/03/15  0831 09/02/15  0701 09/01/15  0621   WBC 8.61 11.09* 10.30   RBC 3.70* 3.86* 4.25   HGB 11.5* 12.1 13.1   HEMATOCRIT 33.3* 35.2* 39.7   MCV 90.0 91.2 93.4   PLATELETS 311 257 196         Recent Labs  Lab 09/03/15  0831 09/02/15  0701 09/01/15  0621 08/31/15  0649 08/30/15  0634   SODIUM 143 144 144 146* 141   POTASSIUM 3.0* 3.1* 3.3* 4.2 3.8   CHLORIDE 110 111 112* 116* 113*   CO2 27 24 22 27 24    BUN 6* 8 8 9 12    CREATININE 0.7 0.8 0.8 0.8 0.8   GLUCOSE 125* 77 76 97 105*   CALCIUM 7.9* 7.8* 8.4* 8.1* 7.3*   MAGNESIUM  --  1.7 1.9 1.7  --          Recent Labs  Lab 08/29/15  0853   ALT 29   AST (SGOT) 50*   BILIRUBIN, TOTAL 0.5   BILIRUBIN, DIRECT 0.2   ALBUMIN 4.2   ALKALINE  PHOSPHATASE 58         Recent Labs  Lab 08/29/15  0853   TROPONIN I <0.01             Microbiology Results     Procedure Component Value Units Date/Time    Blood culture #1 (Aerobic / Anaerobic) [[id number]] Collected:  08/29/15 0851    Specimen Information:  Arm from Blood Updated:  08/29/15 1516    Narrative:      RAC  1 BLUE+1 PURPLE    Blood culture #2  (Aerobic / Anaerobic) [[id number]] Collected:  08/29/15 0859    Specimen Information:  Arm from Blood Updated:  08/29/15 1516    Narrative:      Rhand  1 BLUE+1 PURPLE    Legionella antigen, urine [[id number]] Collected:  08/29/15 1927    Specimen Information:  Urine from Urine, Clean Catch Updated:  08/30/15 0513    Narrative:      ORDER#: [id number]                                    ORDERED BY: Loleta Dicker  SOURCE: Urine, Clean Catch                           COLLECTED:  08/29/15 19:27  ANTIBIOTICS AT COLL.:                                RECEIVED :  08/30/15 00:37  Legionella, Rapid Urinary  Antigen          FINAL       08/30/15 05:13  08/30/15   Negative for Legionella pneumophila Serogroup 1 Antigen             Limitations of Test:             1. Negative results do not exclude infection with Legionella                pneumophila Serogroup 1.             2. Does not detect other serogroups of L. pneumophila                or other Legionella species.             Test Reference Range: Negative      Rapid influenza A/B antigens [[id number]] Collected:  08/29/15 0853    Specimen Information:  Nasopharyngeal from Nasal Aspirate Updated:  08/29/15 0913    Narrative:      ORDER#: [id number]                                    ORDERED BY: Cari Caraway  SOURCE: Nasal Aspirate                               COLLECTED:  08/29/15 08:53  ANTIBIOTICS AT COLL.:                                RECEIVED :  08/29/15 08:59  Influenza Rapid Antigen A&B                FINAL       08/29/15 09:13   +  08/29/15   Positive for Influenza B and Negative for Influenza A              Reference Range: Negative      STREP PNEUMONIA, RAPID URINARY ANTIGEN [[id number]] Collected:  08/29/15 1927    Specimen Information:  Urine, Clean Catch Updated:  08/30/15 0513    Narrative:      ORDER#: [id number]                                    ORDERED BY: Loleta Dicker  SOURCE: Urine, Clean Catch                           COLLECTED:  08/29/15 19:27  ANTIBIOTICS AT COLL.:                                RECEIVED :  08/30/15 00:37  S. pneumoniae, Rapid Urinary Antigen       FINAL       08/30/15 05:12  08/30/15   Negative for Streptococcus pneumoniae Urinary Antigen             Note:             This is a presumptive test for the direct qualitative             detection of bacterial antigen. This test is not intended as  a substitute for a gram stain and bacterial culture. Samples             with extremely low levels of antigen may yield negative             results.             Reference Range: Negative      Urine culture [[id number]] Collected:  08/29/15 1032    Specimen Information:  Urine from Urine, Clean Catch Updated:  08/30/15 1222    Narrative:      ORDER#: [id number]                                    ORDERED BY: Cari Caraway  SOURCE: Urine, Clean Catch                           COLLECTED:  08/29/15 10:32  ANTIBIOTICS AT COLL.:                                RECEIVED :  08/29/15 14:21  Culture Urine                              FINAL       08/30/15 12:22  08/30/15   No growth of >1,000 CFU/ML, No further work             RADIOLOGY     Radiological Procedure reviewed.    Echocardiogram Adult Complete W Clr/ Dopp Waveform   Final Result         1.  The quality of the study is technically adequate for interpretation.   2.  The left ventricle is normal in size. Left ventricular systolic   function is normal. There are no discrete regional wall motion   abnormalities. Estimated EF is  65%. There is no left ventricular   hypertrophy. There is no Doppler evidence of reduced left ventricular    compliance.   3.  The left atrium is normal in size.   4.  The aortic valve is trileaflet. Normal valve excursion is present.   There is no aortic insufficiency. No aortic stenosis is present. The   aortic root is normal in size.   5.  The mitral valve is structurally normal. Mild, centrally directed,   mitral regurgitation is present.   6.  The right ventricle is normal in size and contractility.   7.  The right atrium is normal in size.   8.  The tricuspid valve is structurally normal. There is trace tricuspid   regurgitation.   9.  The pulmonic valve is structurally normal. There is trace pulmonic   insufficiency.   10.  There is no Doppler evidence of pulmonary hypertension.   11.  No pericardial effusion, intracardiac thrombi, or masses are   visualized.   12.  The atrial septum is structurally normal, without shunt by color   flow Doppler.   13.  No prior studies are available for comparison.      Lanette Hampshire, MD    09/03/2015 11:26 AM         CT Chest WO Contrast   Final Result       1. Diffuse bilateral airspace disease greatest in the upper lobes  concerning for pneumonia. Superimposed edema not excluded. Follow-up for   resolution is recommended.   2. Small bilateral pleural effusions.   3. 6 mm left lower lobe pulmonary nodule.   4. Right thyroid nodule.      Kennyth Lose, MD    09/01/2015 6:12 PM         Chest AP Portable   Final Result         Right upper lobe infiltrate concerning for pneumonia      J. Carole Binning, MD    08/29/2015 8:41 AM         XR Chest AP Portable    (Results Pending)       Signed,  Krystal Eaton MD  12:02 PM 09/03/2015

## 2015-09-03 NOTE — Plan of Care (Signed)
Problem: Pain  Goal: Patient's pain/discomfort is manageable  Outcome: Progressing  Pt on scheduled toradol for head ache with good effect.    Problem: Ineffective Airway Clearance  Goal: Airway is maintained  Outcome: Progressing  Non productive cough. On tessalon pearls and dexomethorphan for cough. Continues to be on oxygen 5.5lpm via nasal cannula. SAO2 maintained above 90%.

## 2015-09-03 NOTE — Plan of Care (Signed)
Problem: Safety  Goal: Patient will be free from injury during hospitalization  Outcome: Progressing  Call bell within reach, bed in low position with side rails up x2, pt at low fall risk, hourly rounding done, droplet isolation [place]'ed by Dr. Larry Sierras    Problem: Pain  Goal: Patient's pain/discomfort is manageable  Outcome: Progressing  Pt denies pain     Problem: Inadequate Gas Exchange  Goal: Adequately oxygenating and ventilation is improved  Outcome: Progressing  Pt on 4L of oxygen via NC, O2 sat in low 90's, SOB with exertion but improving      Problem: Infection/Potential for Infection  Goal: Free from infection  Outcome: Progressing  Stable VS, pt remains afebrile, scheduled IV Abx given

## 2015-09-03 NOTE — Consults (Signed)
Service Date: 09/03/2015     Patient Type: I     CONSULTING PHYSICIAN: Angelina Sheriff MD     REFERRING PHYSICIAN: Krystal Eaton MD     CONSULTING SERVICE:  Pulmonology.     REASON FOR CONSULTATION:  Pneumonia, shortness of breath, hypoxemia.     HISTORY OF PRESENT ILLNESS:  The patient is a 55 year old female without significant past medical  history who developed cough and fevers 2 weeks ago.  The patient initially  presented with a cough and fevers.  She was seen by her primary care  doctor, had a negative strep test, and was sent home.  She started having  fevers to 102, along with a nonproductive cough.  She had been coughing  some white sputum with small specks of red in it.  Denied any yellow-green  sputum production.  She was seen at [facility] ER on May 12  for fevers and shortness of breath.  She was admitted for pneumonia, and  during her hospitalization, she has been treated with ceftriaxone and  azithromycin, and she also tested positive for influenza B and has been  treated with Tamiflu.  During her hospital course, she has continued to  have worsening shortness of breath and was changed over to Teflaro and  continued on azithromycin.  A CT scan was done of her chest on May 15 that  showed diffuse bilateral airspace disease, greater in the upper lobes,  along with small bilateral pleural effusions and a 6 mm left lower lobe  pulmonary nodule.  She has been on 5 liters of oxygen, feels slightly  better today than she has for the last 2 days.  She remains dyspneic with  walking to the bathroom.  She denies a history of tobacco use, asthma.     PAST MEDICAL HISTORY:  Significant for gastroesophageal reflux disease, chronic low back pain with  a spinal stenosis, status post an L4-L5-S1 fusion, history of partial  thyroidectomy, psoriasis, history of a breast biopsy.     ALLERGIES TO MEDICATIONS:  SULFA, PERCOCET, TRAMADOL.     SOCIAL HISTORY:  She is divorced, lives alone, and employed  with the local [employer].   Denies any tobacco use.  Occasional alcohol use.     FAMILY HISTORY:  Positive for hypertension and coronary artery disease.     REVIEW OF SYSTEMS:  A 10-point review of systems is positive for cough, shortness of breath,  fevers, headaches.  No rashes, no arthralgias, no abdominal pain,  hematuria.  Other systems reviewed were negative.     CURRENT MEDICATIONS:  DuoNeb q.4 h., azithromycin 500 mg IV q.24 h., ceftaroline 600 mg q.12 h.,  dextromethorphan polistirex ER 30 mg p.o. b.i.d., Lovenox 40 mg subcutaneous daily, Toradol 30 mg IV  q.6 h., Protonix 40 mg p.o. b.i.d.     PHYSICAL EXAMINATION:  GENERAL:   A pleasant female sitting up in bed, mildly dyspneic while  speaking.  VITAL SIGNS:  Temperature is 96.3, heart rate is 107, blood pressure  138/84, O2 saturations 92% on 5.5 liters.  HEENT:  Head is normocephalic, atraumatic.  Eyes are anicteric, not  injected.  NECK:  Supple.  There is no adenopathy.  LUNGS:  There are diffuse crackles with dullness at the bases.  No rhonchi  or wheeze.  CARDIOVASCULAR: regular,  rate and rhythm, S1 and S2, no S3, S4s, murmurs, rubs,  or gallops.  ABDOMEN:  Bowel sounds are present, nontender, nondistended, no rebound or  guarding.  EXTREMITIES:  Without clubbing, cyanosis, or edema.  SKIN:  Dry and intact, without rashes or petechia.  VASCULAR:  2+ radial pulses, 2+ carotids, no JVP.  NEUROLOGIC:  The patient is alert and oriented x3, nonfocal.       LABORATORY AND DIAGNOSTIC DATA:  CBC:  White count is 11.09, hemoglobin 12, hematocrit 35.2, platelets 257.   Sedimentation rate is 35.  BNP 328.  UA is negative for blood.  BUN is 8,  creatinine 0.8, bicarbonate is 24.  LFTs:  AST is 50, ALT is 29, albumin of  4.2.     RECOMMENDATIONS:  Pneumonia:  The patient presents with influenza B infection and worsening  condition consistent with a superimposed bacterial infection.  She is  stabilized but still remains fairly dyspneic.  Other  possibilities could be  an autoimmune or CHF.  The patient does have an elevated BNP that has  progressed since admission.  Would consider doing a cardiac echocardiogram  and doing gentle diuresis for the pulmonary edema.  We will check an ANA,  CRP, and ANCA, and for her hypoxemia, continue to keep her saturations  greater than 92%.  Bronchodilators and with pulmonary hygiene as you are  doing.  Infectious disease (ID) is following, she is on broad-spectrum  antibiotics.  She is MRSA negative.     Thank you for this consultation.  We will follow along with you.           D:  09/03/2015 08:41 AM by Dr. Angelina Sheriff, MD ([id number])  T:  09/03/2015 12:39 PM by Susa Day      (Conf: [id number]) (Doc ID: [id number])

## 2015-09-04 LAB — CBC WITH MANUAL DIFFERENTIAL
Band Neutrophils Absolute: 0 10*3/uL (ref 0.00–1.00)
Band Neutrophils: 0 %
Basophils Absolute Manual: 0 10*3/uL (ref 0.00–0.20)
Basophils Manual: 0 %
Cell Morphology: NORMAL
Eosinophils Absolute Manual: 0.08 10*3/uL (ref 0.00–0.70)
Eosinophils Manual: 1 %
Hematocrit: 31.1 % — ABNORMAL LOW (ref 37.0–47.0)
Hgb: 10.8 g/dL — ABNORMAL LOW (ref 12.0–16.0)
Lymphocytes Absolute Manual: 0.54 10*3/uL (ref 0.50–4.40)
Lymphocytes Manual: 7 %
MCH: 31.3 pg (ref 28.0–32.0)
MCHC: 34.7 g/dL (ref 32.0–36.0)
MCV: 90.1 fL (ref 80.0–100.0)
MPV: 9.9 fL (ref 9.4–12.3)
Monocytes Absolute: 0.69 10*3/uL (ref 0.00–1.20)
Monocytes Manual: 9 %
Neutrophils Absolute Manual: 6.37 10*3/uL (ref 1.80–8.10)
Nucleated RBC: 0 /100 WBC (ref 0–1)
Platelet Estimate: NORMAL
Platelets: 339 10*3/uL (ref 140–400)
RBC: 3.45 10*6/uL — ABNORMAL LOW (ref 4.20–5.40)
RDW: 13 % (ref 12–15)
Segmented Neutrophils: 83 %
WBC: 7.68 10*3/uL (ref 3.50–10.80)

## 2015-09-04 LAB — BASIC METABOLIC PANEL
Anion Gap: 6 (ref 5.0–15.0)
BUN: 5 mg/dL — ABNORMAL LOW (ref 7–19)
CO2: 31 mEq/L — ABNORMAL HIGH (ref 22–29)
Calcium: 8.1 mg/dL — ABNORMAL LOW (ref 8.5–10.5)
Chloride: 107 mEq/L (ref 100–111)
Creatinine: 0.7 mg/dL (ref 0.6–1.0)
Glucose: 102 mg/dL — ABNORMAL HIGH (ref 70–100)
Potassium: 3.4 mEq/L — ABNORMAL LOW (ref 3.5–5.1)
Sodium: 144 mEq/L (ref 136–145)

## 2015-09-04 LAB — ANA SCREEN REFLEX: ANA Screen Reflex: NEGATIVE

## 2015-09-04 LAB — GFR: EGFR: >60

## 2015-09-04 MED ORDER — POTASSIUM CHLORIDE CRYS ER 20 MEQ PO TBCR
40.0000 meq | EXTENDED_RELEASE_TABLET | Freq: Two times a day (BID) | ORAL | Status: DC
Start: 2015-09-04 — End: 2015-09-05
  Administered 2015-09-04: 40 meq via ORAL
  Filled 2015-09-04 (×2): qty 2

## 2015-09-04 MED ORDER — ALBUTEROL-IPRATROPIUM 2.5-0.5 (3) MG/3ML IN SOLN
3.0000 mL | Freq: Four times a day (QID) | RESPIRATORY_TRACT | Status: DC | PRN
Start: 2015-09-04 — End: 2015-09-08

## 2015-09-04 NOTE — Plan of Care (Signed)
Problem: Inadequate Gas Exchange  Goal: Adequately oxygenating and ventilation is improved  Outcome: Progressing  Patient's vitals stable  Non-productive/strong coughing persists, treated with scheduled Tessalon pearls  Also medicated with scheduled Toradol to ease associated with coughing.  Pt has not complained of migraines this shift.  Currently on 4l n/c  Will continue to monitor.

## 2015-09-04 NOTE — Progress Notes (Signed)
ID PROGRESS NOTE    Date Time: 09/04/2015 11:59 AM  Patient Name: [name], [name]        Subjective:   Starting to feel better since last night  Still with coughing spells; but improved  Not much sputum  dyspnea better   O2 weaning ; no won 3 liters  Denied F/C/N/V/D/CP/ abd pain    Medications:     Ceftriaxone # 2 - total # 7    S/p ceftaroline  S/p oseltamivir -10 doses  S/p azithro X 5 d     Lines:     CENTRAL LINES:    Review of Systems:     As above    Physical Exam:     Filed Vitals:    09/04/15 0758   BP: 121/79   Pulse: 91   Temp: 95.7 F (35.4 C)   Resp: 18   SpO2: 95% RA     TMAX: 98.9    GEN: NAD ; coughing peridoically  HEENT: AT/ NC  CV: RRR no mumurs  PULM:decreased BS bilaterally; no rales; NO wheezing  ABD: soft nontender nabs. No guarding or rebound  EXT: NO edema BLE  DERM: psoriatic plaques upper back ; Lateral Left thigh    Labs:       Recent Labs  Lab 09/04/15  0652 09/03/15  0831 09/02/15  0701   WBC 7.68 8.61 11.09*   RBC 3.45* 3.70* 3.86*   HGB 10.8* 11.5* 12.1   HEMATOCRIT 31.1* 33.3* 35.2*   MCV 90.1 90.0 91.2   MCHC 34.7 34.5 34.4   RDW 13 13 14    MPV 9.9 10.0 10.4   PLATELETS 339 311 257         Recent Labs  Lab 09/04/15  0653 09/03/15  0831 09/02/15  0701 09/01/15  0621 08/31/15  0649   SODIUM 144 143 144 144 146*   POTASSIUM 3.4* 3.0* 3.1* 3.3* 4.2   CHLORIDE 107 110 111 112* 116*   CO2 31* 27 24 22 27    BUN 5* 6* 8 8 9    CREATININE 0.7 0.7 0.8 0.8 0.8   GLUCOSE 102* 125* 77 76 97   CALCIUM 8.1* 7.9* 7.8* 8.4* 8.1*   MAGNESIUM  --   --  1.7 1.9 1.7         Recent Labs  Lab 08/29/15  0853   ALT 29   AST (SGOT) 50*   BILIRUBIN, TOTAL 0.5   BILIRUBIN, DIRECT 0.2   ALBUMIN 4.2   ALKALINE PHOSPHATASE 58       MICRO  5/15 MRSA cx negative  5/12 urine legionella & strep antigen  5/12 bcx ngtd  5/12 flu B positive    Rads:     5/17 CXR  Interval progression of bilateral interstitial opacities and basilar opacities,   and new left pleural effusion, which may reflect edema and/or infiltrate.      5/17 2 d echo: no mention of vegetation    5/15 ct chest 1. Diffuse bilateral airspace disease greatest in the upper lobes  concerning for pneumonia. Superimposed edema not excluded. Follow-up for  resolution is recommended.  2. Small bilateral pleural effusions.  3. 6 mm left lower lobe pulmonary nodule.  4. Right thyroid nodule.    Assessment:      55 year old female  Hx of psoriasis ( skin)    Admitted with 2 weeks fevers/ cough    Hypoxia    Multifocal pneumonia ;     Bilateral pulmonary effusions  LLL 6 MM pulm nodule.     Plan:     1. influenza B / multifocal PNA / cough  - completed course of oseltamivir  - completed 5 D azitrho  - on ceftriaxone; will likely d/c in next few days/ convert to PO  - diuresis as per pulm/ primary  - residual cough ; likely post infection inflammatory / reactive airway  - clinically she is starting to feel better    2. Hypoxia- wean O2 as tolerated    3.  D/w infection control ok to keep off droplet ; last temp 5/16; completed treatment for flu    4. Fevers/ leukocytosis; due to above ; better    D/w infection control Dirithy B & Rn Tom    Signed by: [place] [id number]

## 2015-09-04 NOTE — Progress Notes (Signed)
Active Hospital Problems    Diagnosis   . Pneumonia of right upper lobe due to infectious organism   . Pneumonia   PROGRESS NOTE    Date Time: 09/04/2015 10:43 AM  Patient Name: [name], [name]      Assessment/Plan   1. Pneumonia: Pt with multilobar pneumonia with influenza B and probable bacterial superinfection.  She is improving and less dyspneic.  Would continue with antibiotics as per ID.  I do not think she would benefit from steroids at this time but will consider it should she not continue to progress.  Her elevated CRP is from ongoing infection.   2. Hypoxemia: improving sats and now 95% on 4 liter O2.   3. Cough: continue with as needed cough meds.       Subjective:   The patient is a 55 year old female without significant past medical  history who developed cough and fevers 2 weeks ago. The patient initially  presented with a cough and fevers.  She feels better today with less cough, shortness of breath and improved appetite.  She denies fever, chills, chest pain or hemoptysis.     Medications:     Current Facility-Administered Medications   Medication Dose Route Frequency   . albuterol-ipratropium  3 mL Nebulization Q4H WA   . benzonatate  100 mg Oral TID   . cefTRIAXone  2 g Intravenous Q24H SCH   . dextromethorphan polistirex ER  30 mg Oral Q12H SCH   . enoxaparin  40 mg Subcutaneous Daily   . ketorolac  30 mg Intravenous 4 times per day   . pantoprazole  40 mg Oral BID AC       Review of Systems:   A comprehensive review of systems was: Negative except cough and dyspnea    Physical Exam:     Filed Vitals:    09/04/15 0758   BP: 121/79   Pulse: 91   Temp: 95.7 F (35.4 C)   Resp: 18   SpO2: 95%       Intake and Output Summary (Last 24 hours) at Date Time    Intake/Output Summary (Last 24 hours) at 09/04/15 1043  Last data filed at 09/04/15 0737   Gross per 24 hour   Intake    320 ml   Output      0 ml   Net    320 ml       General appearance - acyanotic, in no respiratory distress  Mental status  - alert, oriented to person, place, and time  Neck - supple, no significant adenopathy  Lymphatics - no palpable lymphadenopathy, no hepatosplenomegaly  Chest - diminished breath sounds at bases with improved crackles in upper airways.   Heart - normal rate, regular rhythm, normal S1, S2, no murmurs, rubs, clicks or gallops  Abdomen - soft, nontender, nondistended, no masses or organomegaly  Neurological - alert, oriented, normal speech, no focal findings or movement disorder noted  Extremities - peripheral pulses normal, no pedal edema, no clubbing or cyanosis  Skin - normal coloration and turgor, no rashes, no suspicious skin lesions noted    Labs:     Results     Procedure Component Value Units Date/Time    CBC WITH MANUAL DIFFERENTIAL [[id number]]  (Abnormal) Collected:  09/04/15 0652     WBC 7.68 x10 3/uL Updated:  09/04/15 0745     Hgb 10.8 (L) g/dL      Hematocrit 40.9 (L) %      Platelets 339  x10 3/uL      RBC 3.45 (L) x10 6/uL      MCV 90.1 fL      MCH 31.3 pg      MCHC 34.7 g/dL      RDW 13 %      MPV 9.9 fL      Segmented Neutrophils 83 %      Band Neutrophils 0 %      Lymphocytes Manual 7 %      Monocytes Manual 9 %      Eosinophils Manual 1 %      Basophils Manual 0 %      Nucleated RBC 0 /100 WBC      Abs Seg Manual 6.37 x10 3/uL      Bands Absolute 0.00 x10 3/uL      Absolute Lymph Manual 0.54 x10 3/uL      Monocytes Absolute 0.69 x10 3/uL      Absolute Eos Manual 0.08 x10 3/uL      Absolute Baso Manual 0.00 x10 3/uL      Cell Morphology: Normal      Platelet Estimate Normal     Basic Metabolic Panel [[id number]]  (Abnormal) Collected:  09/04/15 0653    Specimen Information:  Blood Updated:  09/04/15 0734     Glucose 102 (H) mg/dL      BUN 5 (L) mg/dL      Creatinine 0.7 mg/dL      Calcium 8.1 (L) mg/dL      Sodium 425 mEq/L      Potassium 3.4 (L) mEq/L      Chloride 107 mEq/L      CO2 31 (H) mEq/L      Anion Gap 6.0     GFR [[id number]] Collected:  09/04/15 0653     EGFR >60.0 Updated:  09/04/15 0734     Blood culture #1 (Aerobic / Anaerobic) [[id number]] Collected:  08/29/15 0851    Specimen Information:  Arm from Blood Updated:  09/03/15 1721    Narrative:      ORDER#: [id number]                                    ORDERED BY: Cari Caraway  SOURCE: Blood arm                                    COLLECTED:  08/29/15 08:51  ANTIBIOTICS AT COLL.:                                RECEIVED :  08/29/15 15:16  Culture Blood Aerobic and Anaerobic        FINAL       09/03/15 17:21  09/03/15   No growth after 5 days of incubation.      Blood culture #2  (Aerobic / Anaerobic) [[id number]] Collected:  08/29/15 0859    Specimen Information:  Arm from Blood Updated:  09/03/15 1721    Narrative:      ORDER#: [id number]                                    ORDERED BY: Cari Caraway  SOURCE: Blood arm  COLLECTED:  08/29/15 08:59  ANTIBIOTICS AT COLL.:                                RECEIVED :  08/29/15 15:16  Culture Blood Aerobic and Anaerobic        FINAL       09/03/15 17:21  09/03/15   No growth after 5 days of incubation.      ANA SCREEN REFLEX [[id number]] Collected:  09/03/15 1009     Updated:  09/03/15 1408    Glucose Whole Blood - POCT [[id number]] Collected:  09/03/15 1212     POCT - Glucose Whole blood 88 mg/dL Updated:  78/29/56 2130    C Reactive Protein [[id number]]  (Abnormal) Collected:  09/03/15 1009    Specimen Information:  Blood Updated:  09/03/15 1115     C-Reactive Protein 21.1 (H) mg/dL     ANCA Screen W/Reflex to Titer [[id number]] Collected:  09/03/15 1009     Updated:  09/03/15 1047          Recent CBC   Recent Labs      09/04/15   0652   RBC  3.45*   HGB  10.8*   HEMATOCRIT  31.1*   MCV  90.1   MCH  31.3   MCHC  34.7   RDW  13   MPV  9.9       Rads:   Radiological Procedure reviewed.    Signed by: Angelina Sheriff    [id number]

## 2015-09-04 NOTE — Plan of Care (Signed)
Problem: Health Promotion  Goal: Knowledge - disease process  Extent of understanding conveyed about a specific disease process.   Outcome: Progressing  Pt aware of reason for admission and the plan for the day.

## 2015-09-04 NOTE — Progress Notes (Signed)
Patient: [name]  Date: 09/04/2015   LOS: 6 Days  Admission Date: 08/29/2015   MRN: [id number]  Attending: Krystal Eaton MD     ASSESSMENT/PLAN     [name] is a 54 y.o. female admitted with Fever/cough    Assessment and Plan:    1. Severe Sepsis on arrival: influenza B pna and also posible is  Superimposed  Bacterial pneumonia.   -Urine Legionella and strep antigens negative  -was on CAP tx with  Ceftriaxone, Zithro and started on  Tamiflu(finished 5 day course)    -Switched to ceftaroline for broader coverage as pt was slow to recover but now back on ceftriaxone 2g/day  - MRSA screen negative  - CT 5/15noted with multifocal bilateral infiltrate.   Fever curve and wbc wnl now.     2. acute Hypoxic resp failure sec to #1 likely  -Persistent hypoxia but slowly improving. Now on 3L.   As above  -in addition, now on standing DuoNeb's every 4 hours while awake,  BNP slightly elevated, but echocardiogram without evidence for congestive heart failure.  Did get a dose of Lasix,  -Continue O2 supplement, weening   -pulm consult appreciated.  Inflammatory markers up    3. Mild thrombocytopenia probably from Infection  resolved    4. GERD on PPI    5. Headache has hx of Migraine HA (follows with Dr Hamilton Capri)  Better. Appreciate neuro    6. Hypokalemia replete again today      Nutrition: Regular Diet    GI Prophylaxis: PPI    DVT Prophylaxis: Lovenox 40 mg subQ daily       Code Status: full code    DISPO:   pending    Safety Checklist  Foley: Not present   IVs:  Peripheral IV   PT/OT: Not needed   Daily CBC & or Chem ordered:   Yes                 SUBJECTIVE     [name] states she feels better today and able to walk more. No fever. Denies diarrhea. Appetite better     MEDICATIONS     Current Facility-Administered Medications   Medication Dose Route Frequency   . benzonatate  100 mg Oral TID   . cefTRIAXone  2 g Intravenous Q24H SCH   . dextromethorphan polistirex ER  30 mg Oral Q12H SCH    . enoxaparin  40 mg Subcutaneous Daily   . ketorolac  30 mg Intravenous 4 times per day   . pantoprazole  40 mg Oral BID AC       ROS     Remainder of 10 point ROS as above or otherwise negative    PHYSICAL EXAM     Filed Vitals:    09/04/15 1218   BP: 136/64   Pulse: 95   Temp: 98.1 F (36.7 C)   Resp: 19   SpO2: 92%       Temperature: Temp  Min: 95.7 F (35.4 C)  Max: 98.9 F (37.2 C)  Pulse: Pulse  Min: 91  Max: 104  Respiratory: Resp  Min: 18  Max: 30  Non-Invasive BP: BP  Min: 121/79  Max: 136/64  Pulse Oximetry SpO2  Min: 91 %  Max: 95 %    Intake and Output Summary (Last 24 hours) at Date Time    Intake/Output Summary (Last 24 hours) at 09/04/15 1457  Last data filed at 09/04/15 4742   Gross  per 24 hour   Intake    320 ml   Output      0 ml   Net    320 ml     In Mod pain  GEN APPEARANCE: Normal;  A&OX3  HEENT: EOMI;   NECK: Supple;   CVS: tachy, S1, S2; No M/G/R  LUNGS: improved aeration in upper lobes.   No rales  ABD: Soft; No TTP; + Normoactive BS  EXT: no pedal edema;   SKIN: No rash or Lesions  NEURO:  No Focal neurological deficits      LABS       Recent Labs  Lab 09/04/15  0652 09/03/15  0831 09/02/15  0701   WBC 7.68 8.61 11.09*   RBC 3.45* 3.70* 3.86*   HGB 10.8* 11.5* 12.1   HEMATOCRIT 31.1* 33.3* 35.2*   MCV 90.1 90.0 91.2   PLATELETS 339 311 257         Recent Labs  Lab 09/04/15  0653 09/03/15  0831 09/02/15  0701 09/01/15  0621 08/31/15  0649   SODIUM 144 143 144 144 146*   POTASSIUM 3.4* 3.0* 3.1* 3.3* 4.2   CHLORIDE 107 110 111 112* 116*   CO2 31* 27 24 22 27    BUN 5* 6* 8 8 9    CREATININE 0.7 0.7 0.8 0.8 0.8   GLUCOSE 102* 125* 77 76 97   CALCIUM 8.1* 7.9* 7.8* 8.4* 8.1*   MAGNESIUM  --   --  1.7 1.9 1.7         Recent Labs  Lab 08/29/15  0853   ALT 29   AST (SGOT) 50*   BILIRUBIN, TOTAL 0.5   BILIRUBIN, DIRECT 0.2   ALBUMIN 4.2   ALKALINE PHOSPHATASE 58         Recent Labs  Lab 08/29/15  0853   TROPONIN I <0.01             Microbiology Results     Procedure Component Value Units  Date/Time    Blood culture #1 (Aerobic / Anaerobic) [[id number]] Collected:  08/29/15 0851    Specimen Information:  Arm from Blood Updated:  08/29/15 1516    Narrative:      RAC  1 BLUE+1 PURPLE    Blood culture #2  (Aerobic / Anaerobic) [[id number]] Collected:  08/29/15 0859    Specimen Information:  Arm from Blood Updated:  08/29/15 1516    Narrative:      Rhand  1 BLUE+1 PURPLE    Legionella antigen, urine [[id number]] Collected:  08/29/15 1927    Specimen Information:  Urine from Urine, Clean Catch Updated:  08/30/15 0513    Narrative:      ORDER#: [id number]                                    ORDERED BY: Loleta Dicker  SOURCE: Urine, Clean Catch                           COLLECTED:  08/29/15 19:27  ANTIBIOTICS AT COLL.:                                RECEIVED :  08/30/15 00:37  Legionella, Rapid Urinary Antigen          FINAL  08/30/15 05:13  08/30/15   Negative for Legionella pneumophila Serogroup 1 Antigen             Limitations of Test:             1. Negative results do not exclude infection with Legionella                pneumophila Serogroup 1.             2. Does not detect other serogroups of L. pneumophila                or other Legionella species.             Test Reference Range: Negative      Rapid influenza A/B antigens [[id number]] Collected:  08/29/15 0853    Specimen Information:  Nasopharyngeal from Nasal Aspirate Updated:  08/29/15 0913    Narrative:      ORDER#: [id number]                                    ORDERED BY: Cari Caraway  SOURCE: Nasal Aspirate                               COLLECTED:  08/29/15 08:53  ANTIBIOTICS AT COLL.:                                RECEIVED :  08/29/15 08:59  Influenza Rapid Antigen A&B                FINAL       08/29/15 09:13   +  08/29/15   Positive for Influenza B and Negative for Influenza A             Reference Range: Negative      STREP PNEUMONIA, RAPID URINARY ANTIGEN [[id number]] Collected:  08/29/15 1927    Specimen Information:  Urine, Clean  Catch Updated:  08/30/15 0513    Narrative:      ORDER#: [id number]                                    ORDERED BY: Loleta Dicker  SOURCE: Urine, Clean Catch                           COLLECTED:  08/29/15 19:27  ANTIBIOTICS AT COLL.:                                RECEIVED :  08/30/15 00:37  S. pneumoniae, Rapid Urinary Antigen       FINAL       08/30/15 05:12  08/30/15   Negative for Streptococcus pneumoniae Urinary Antigen             Note:             This is a presumptive test for the direct qualitative             detection of bacterial antigen. This test is not intended as             a substitute for a gram stain and bacterial  culture. Samples             with extremely low levels of antigen may yield negative             results.             Reference Range: Negative      Urine culture [[id number]] Collected:  08/29/15 1032    Specimen Information:  Urine from Urine, Clean Catch Updated:  08/30/15 1222    Narrative:      ORDER#: [id number]                                    ORDERED BY: Cari Caraway  SOURCE: Urine, Clean Catch                           COLLECTED:  08/29/15 10:32  ANTIBIOTICS AT COLL.:                                RECEIVED :  08/29/15 14:21  Culture Urine                              FINAL       08/30/15 12:22  08/30/15   No growth of >1,000 CFU/ML, No further work             RADIOLOGY     Radiological Procedure reviewed.    XR Chest AP Portable   Final Result         Interval progression of bilateral interstitial opacities and basilar   opacities, and new left pleural effusion, which may reflect edema and/or   infiltrate.      Georgiana Spinner, MD    09/03/2015 12:45 PM         Echocardiogram Adult Complete W Clr/ Dopp Waveform   Final Result         1.  The quality of the study is technically adequate for interpretation.   2.  The left ventricle is normal in size. Left ventricular systolic   function is normal. There are no discrete regional wall motion   abnormalities. Estimated EF is  65%.  There is no left ventricular   hypertrophy. There is no Doppler evidence of reduced left ventricular   compliance.   3.  The left atrium is normal in size.   4.  The aortic valve is trileaflet. Normal valve excursion is present.   There is no aortic insufficiency. No aortic stenosis is present. The   aortic root is normal in size.   5.  The mitral valve is structurally normal. Mild, centrally directed,   mitral regurgitation is present.   6.  The right ventricle is normal in size and contractility.   7.  The right atrium is normal in size.   8.  The tricuspid valve is structurally normal. There is trace tricuspid   regurgitation.   9.  The pulmonic valve is structurally normal. There is trace pulmonic   insufficiency.   10.  There is no Doppler evidence of pulmonary hypertension.   11.  No pericardial effusion, intracardiac thrombi, or masses are   visualized.   12.  The atrial septum is structurally normal, without shunt by color   flow Doppler.   13.  No prior studies are available for comparison.      Lanette Hampshire, MD    09/03/2015 11:26 AM         CT Chest WO Contrast   Final Result       1. Diffuse bilateral airspace disease greatest in the upper lobes   concerning for pneumonia. Superimposed edema not excluded. Follow-up for   resolution is recommended.   2. Small bilateral pleural effusions.   3. 6 mm left lower lobe pulmonary nodule.   4. Right thyroid nodule.      Kennyth Lose, MD    09/01/2015 6:12 PM         Chest AP Portable   Final Result         Right upper lobe infiltrate concerning for pneumonia      J. Carole Binning, MD    08/29/2015 8:41 AM             Signed,  Krystal Eaton MD  2:57 PM 09/04/2015

## 2015-09-04 NOTE — Plan of Care (Signed)
Problem: Safety  Goal: Patient will be free from injury during hospitalization  Outcome: Progressing   q1H rounding implementing, nurse communication devices within pt reach, prevent falls, bedside table with personal items within reach.

## 2015-09-04 NOTE — Plan of Care (Signed)
Per Dr. Evalyn Casco (ID), no isolation. telephone order at 1230.

## 2015-09-05 ENCOUNTER — Inpatient Hospital Stay: Payer: No Typology Code available for payment source

## 2015-09-05 LAB — CBC WITH MANUAL DIFFERENTIAL
Band Neutrophils Absolute: 0.27 10*3/uL (ref 0.00–1.00)
Band Neutrophils: 4 %
Basophils Absolute Manual: 0 10*3/uL (ref 0.00–0.20)
Basophils Manual: 0 %
Cell Morphology: NORMAL
Eosinophils Absolute Manual: 0.07 10*3/uL (ref 0.00–0.70)
Eosinophils Manual: 1 %
Hematocrit: 32.4 % — ABNORMAL LOW (ref 37.0–47.0)
Hgb: 11.1 g/dL — ABNORMAL LOW (ref 12.0–16.0)
Lymphocytes Absolute Manual: 0.66 10*3/uL (ref 0.50–4.40)
Lymphocytes Manual: 10 %
MCH: 31.2 pg (ref 28.0–32.0)
MCHC: 34.3 g/dL (ref 32.0–36.0)
MCV: 91 fL (ref 80.0–100.0)
MPV: 9.7 fL (ref 9.4–12.3)
Monocytes Absolute: 0.66 10*3/uL (ref 0.00–1.20)
Monocytes Manual: 10 %
Myelocytes Absolute: 0.07 10*3/uL — ABNORMAL HIGH
Myelocytes: 1 %
Neutrophils Absolute Manual: 4.91 10*3/uL (ref 1.80–8.10)
Nucleated RBC: 0 /100 WBC (ref 0–1)
Platelet Estimate: NORMAL
Platelets: 423 10*3/uL — ABNORMAL HIGH (ref 140–400)
RBC: 3.56 10*6/uL — ABNORMAL LOW (ref 4.20–5.40)
RDW: 14 % (ref 12–15)
Segmented Neutrophils: 74 %
WBC: 6.64 10*3/uL (ref 3.50–10.80)

## 2015-09-05 MED ORDER — POTASSIUM CHLORIDE 20 MEQ PO PACK
40.0000 meq | PACK | Freq: Once | ORAL | Status: AC
Start: 2015-09-05 — End: 2015-09-05
  Administered 2015-09-05: 40 meq via ORAL
  Filled 2015-09-05: qty 2

## 2015-09-05 MED ORDER — PANTOPRAZOLE SODIUM 40 MG PO TBEC
40.0000 mg | DELAYED_RELEASE_TABLET | Freq: Every day | ORAL | Status: DC
Start: 2015-09-06 — End: 2015-09-08
  Administered 2015-09-06 – 2015-09-08 (×3): 40 mg via ORAL
  Filled 2015-09-05 (×3): qty 1

## 2015-09-05 NOTE — Progress Notes (Signed)
PULSE OXIMETRY TESTING:   Document patient's oxygen at rest on room air:   ___91__% on room air, at rest (No ranges please)   ___93__% on oxygen, at rest at___2__LPM via NC   IF 88% OR BELOW on room air, STOP HERE,   IF NOT ambulate patient on room air with exertion and document below:   ___87__% on room air, with exertion (must be 88% or below)   __92___% on oxygen, with exertion, at___3__LPM via NC   All three tests must be during the same session.   Please document in a PROGRESS NOTE.   Testing to qualify for home oxygen must be no earlier than 48 hours prior to discharge, or it will need to be repeated.   Please call the home health care liaison at [phone] or case manager when completed.

## 2015-09-05 NOTE — Progress Notes (Signed)
Nutritional Support Services  Nutrition Follow Up    [name] 55 y.o. female   MRN: [id number]        Nutrition Summary:  Pt remains in the hospital being treated for influenza B/PNA and possible superimposed bacterial PNA. Pt seen, alert and oriented x3. Reports continued good appetite. Denies N/V/D/C. No nutrition related questions at this time.    Nutrition Diagnosis:   No acute nutrition related problems at this time.    Intervention:   1. Continue current diet     Goal: Continued PO intake >70% meals    Assessment Data:  Adm dx: PNA  Patient Active Problem List   Diagnosis   . Synovial cyst   . Stenosis, spinal, lumbar   . Pneumonia   . Pneumonia of right upper lobe due to infectious organism       Recent Labs  Lab 09/04/15  0653 09/03/15  0831 09/02/15  0701 09/01/15  0621 08/31/15  0649   SODIUM 144 143 144 144 146*   POTASSIUM 3.4* 3.0* 3.1* 3.3* 4.2   CHLORIDE 107 110 111 112* 116*   CO2 31* 27 24 22 27    BUN 5* 6* 8 8 9    CREATININE 0.7 0.7 0.8 0.8 0.8   GLUCOSE 102* 125* 77 76 97   CALCIUM 8.1* 7.9* 7.8* 8.4* 8.1*   MAGNESIUM  --   --  1.7 1.9 1.7     Pertinent meds: tessalon, rocephin, lasix, protonix,     Current Diet Order  Diet regular    Food intake: 100% meals    GI symptoms: Denies N/V/D/C    Learning Needs: none at this time    Anthropometrics  Height: 170.2 cm (5\' 7" )  Weight: 64.864 kg (143 lb)  Weight Change: 3.29  IBW/kg (Calculated) Female: 67.29 kg  IBW/kg (Calculated) Female: 61.35 kg  BMI (calculated): 22.4    Monitoring/Evaluation:   1. PO intake  2. Weights  3. GI symptoms    Shaheed Schmuck McClure RD, CNSC  Ext. [phone]

## 2015-09-05 NOTE — Plan of Care (Signed)
Problem: Inadequate Gas Exchange  Goal: Adequately oxygenating and ventilation is improved  Outcome: Progressing  Pt states "doing better" today than yesterday. Still coughing at times, gets DOE when ambulating, on 5l nc at this time, pt sitting 90degrees in bed. Had fever off 100.3 around 1930 and was given tylenol with good results. Pt ambulatory and using call bell appropriatley. Hourly rounding.

## 2015-09-05 NOTE — Progress Notes (Signed)
Pt was on 3l most of the night and sats were 94-95%. Pt states she was "doing just fine". Lowered o2 to 2l. Advised patient to call for any needs.  Sats showing 93-94%.

## 2015-09-05 NOTE — Progress Notes (Addendum)
ID PROGRESS NOTE    Date Time: 09/05/2015 10:16 AM  Patient Name: [name], [name]        Subjective:     Doing better today  Yesterday was able to walk around halls without O2 yesterday - not dyspneic  But presently on 2L  & sats 91 %  Still with coughing spells; but improved ; no sputum,   Isolated  low-grade, 100 degree temperature yesterday ; but not consistently using incentive spirometry  Denied chills/N/V/D/CP/ abd pain    Medications:     Ceftriaxone # 3 - total # 8    S/p ceftaroline  S/p oseltamivir -10 doses  S/p azithro X 5 d     Lines:     CENTRAL LINES: NONE  R arm P-IV no erythema/ nontender    Review of Systems:     As above    Physical Exam:   Addendum/ corrected vitals  Filed Vitals:    09/05/15 0815   BP: 128/57   Pulse: 100   Temp: 96.5 F (35.8 C)   Resp: 18   SpO2: 91% on 2 liters     TMAX:  100.3    GEN: NAD ; non-toxic; not coughing today  HEENT: AT/ NC  CV: RRR no mumurs  PULM:decreased BS bilaterally; no rales; NO wheezing  ABD: soft nontender nabs. No guarding or rebound  EXT: NO edema BLE  DERM: psoriatic plaques over back ; Lateral Left thigh    Labs:       Recent Labs  Lab 09/05/15  0609 09/04/15  0652 09/03/15  0831   WBC 6.64 7.68 8.61   RBC 3.56* 3.45* 3.70*   HGB 11.1* 10.8* 11.5*   HEMATOCRIT 32.4* 31.1* 33.3*   MCV 91.0 90.1 90.0   MCHC 34.3 34.7 34.5   RDW 14 13 13    MPV 9.7 9.9 10.0   PLATELETS 423* 339 311         Recent Labs  Lab 09/04/15  0653 09/03/15  0831 09/02/15  0701 09/01/15  0621 08/31/15  0649   SODIUM 144 143 144 144 146*   POTASSIUM 3.4* 3.0* 3.1* 3.3* 4.2   CHLORIDE 107 110 111 112* 116*   CO2 31* 27 24 22 27    BUN 5* 6* 8 8 9    CREATININE 0.7 0.7 0.8 0.8 0.8   GLUCOSE 102* 125* 77 76 97   CALCIUM 8.1* 7.9* 7.8* 8.4* 8.1*   MAGNESIUM  --   --  1.7 1.9 1.7             MICRO  5/15 MRSA cx negative  5/12 urine legionella & strep antigen  5/12 bcx ngtd  5/12 flu B positive    Rads:     5/17 CXR  Interval progression of bilateral interstitial opacities and basilar  opacities,   and new left pleural effusion, which may reflect edema and/or infiltrate.     5/17 2 d echo: no mention of vegetation    5/15 ct chest 1. Diffuse bilateral airspace disease greatest in the upper lobes  concerning for pneumonia. Superimposed edema not excluded. Follow-up for  resolution is recommended.  2. Small bilateral pleural effusions.  3. 6 mm left lower lobe pulmonary nodule.  4. Right thyroid nodule.    Assessment:      55 year old female  Hx of psoriasis ( skin)    Admitted with 2 weeks fevers/ cough    Hypoxia    Multifocal pneumonia ;  Bilateral pulmonary effusions    LLL 6 MM pulm nodule.      reactive thrombocytosis    Plan:     1. influenza B / multifocal PNA / cough  - completed course of oseltamivir  - completed 5 D azitrho  - on ceftriaxone; will likely d/c in next few days/ convert to PO soon  - diuresis as per pulm/ primary  - residual cough ; likely post infection inflammatory / reactive airway  - clinically she is starting to feel better  - repeat cxr pending today    2. Hypoxia- wean O2 as tolerated - d/w RN to check pulse ox after walking    3.  D/w infection control  5/18 - okay to keep off of isolation     4. Fevers  - Isolated 100, low-grade temp yesterday  - But not using incentive spirometry consistently    5. leukocytosis; due to above ; better/resolved    D/w pt & Rn Judeth Cornfield    Signed by: [place] [id number]

## 2015-09-05 NOTE — Plan of Care (Signed)
Problem: Pain  Goal: Patient's pain/discomfort is manageable  Outcome: Not Progressing  No complaint of pain today. Will continue to assess and intervene if needed    Problem: Potential for Compromised Hemodynamic Status  Goal: Stable vital signs and fluid balance  Outcome: Progressing  Pt still has a dry cough. On tessalon perle scheduled. Still requires oxygen especially on exertion. Oxygen saturation dropped to 87% on ambulation in the hall. Vital signs stable. No fever noted today. Will continue to assess respiratory status.

## 2015-09-05 NOTE — Progress Notes (Signed)
Active Hospital Problems    Diagnosis   . Pneumonia of right upper lobe due to infectious organism   . Pneumonia   PROGRESS NOTE    Date Time: 09/05/2015 10:07 AM  Patient Name: [name], [name]      Assessment/Plan   1. Pneumonia: Pt with multilobar pneumonia with influenza B and probable bacterial superinfection.  She continues to improve and is down to 2 liter Nc.  Continue with current abx and will get a follow up CXR.   2. Hypoxemia: improving sats and now 94% on 2 liter O2.   3. Cough: continue with as needed cough meds.       Subjective:   The patient is a 55 year old female without significant past medical  history who developed cough and fevers 2 weeks ago. The patient initially  presented with a cough and fevers.  She feels better today with less cough, shortness of breath and improved appetite.  She has been having low grade fevers at night.  She denies chest pain or wheeze, but has continued cough without sputum.      Medications:     Current Facility-Administered Medications   Medication Dose Route Frequency   . benzonatate  100 mg Oral TID   . cefTRIAXone  2 g Intravenous Q24H SCH   . dextromethorphan polistirex ER  30 mg Oral Q12H SCH   . enoxaparin  40 mg Subcutaneous Daily   . pantoprazole  40 mg Oral BID AC   . potassium chloride  40 mEq Oral Once       Review of Systems:   A comprehensive review of systems was: Negative except cough and dyspnea    Physical Exam:     Filed Vitals:    09/05/15 0815   BP: 128/57   Pulse: 100   Temp: 96.5 F (35.8 C)   Resp: 18   SpO2: 91%       Intake and Output Summary (Last 24 hours) at Date Time  No intake or output data in the 24 hours ending 09/05/15 1007    General appearance - acyanotic, in no respiratory distress  Mental status - alert, oriented to person, place, and time  Neck - supple, no significant adenopathy  Lymphatics - no palpable lymphadenopathy, no hepatosplenomegaly  Chest - diminished breath sounds at bases    Heart - normal rate, regular  rhythm, normal S1, S2, no murmurs, rubs, clicks or gallops  Abdomen - soft, nontender, nondistended, no masses or organomegaly  Neurological - alert, oriented, normal speech, no focal findings or movement disorder noted  Extremities - peripheral pulses normal, no pedal edema, no clubbing or cyanosis  Skin - normal coloration and turgor, no rashes, no suspicious skin lesions noted       Labs:     Results     Procedure Component Value Units Date/Time    CBC WITH MANUAL DIFFERENTIAL [[id number]]  (Abnormal) Collected:  09/05/15 0609     WBC 6.64 x10 3/uL Updated:  09/05/15 0820     Hgb 11.1 (L) g/dL      Hematocrit 25.3 (L) %      Platelets 423 (H) x10 3/uL      RBC 3.56 (L) x10 6/uL      MCV 91.0 fL      MCH 31.2 pg      MCHC 34.3 g/dL      RDW 14 %      MPV 9.7 fL      Segmented Neutrophils 74 %  Band Neutrophils 4 %      Lymphocytes Manual 10 %      Monocytes Manual 10 %      Eosinophils Manual 1 %      Basophils Manual 0 %      Myelocytes 1 %      Nucleated RBC 0 /100 WBC      Abs Seg Manual 4.91 x10 3/uL      Bands Absolute 0.27 x10 3/uL      Absolute Lymph Manual 0.66 x10 3/uL      Monocytes Absolute 0.66 x10 3/uL      Absolute Eos Manual 0.07 x10 3/uL      Absolute Baso Manual 0.00 x10 3/uL      Absolute Myelocyte 0.07 (H) x10 3/uL      Cell Morphology: Normal      Platelet Estimate Normal     ANA SCREEN REFLEX [[id number]] Collected:  09/03/15 1009     ANA Screen Reflex Negative Updated:  09/04/15 1505          Recent CBC   Recent Labs      09/05/15   0609   RBC  3.56*   HGB  11.1*   HEMATOCRIT  32.4*   MCV  91.0   MCH  31.2   MCHC  34.3   RDW  14   MPV  9.7       Rads:   Radiological Procedure reviewed.    Signed by: Angelina Sheriff    [phone]

## 2015-09-05 NOTE — Plan of Care (Signed)
Problem: Inadequate Gas Exchange  Goal: Adequately oxygenating and ventilation is improved  Outcome: Progressing  Patient is resting comfortably in NSR on telemetry with o2 saturation at 94% on 2 liters oxygen per nasal cannula.  Pt able to ambulate independently without drop in o2 sat with the use of supplemental oxygen.  Pt does have a nonproductive cough but is without discomfort.  Will continue to monitor.

## 2015-09-05 NOTE — UM Notes (Signed)
PATIENT NAME: [name],[name]   DOB: 10-09-1960   CONTINUED STAY REVIEW FOR: 09/04/2015 to 09/05/2015   NOTES TO REVIEWER:    This clinical review is based on/compiled from documentation provided by the treatment team within the patient's medical record.   UTILIZATION REVIEW CONTACT: Cherly Anderson. Barnie Alderman, RN, BSN  Clinical Case Manager  - Utilization Review  [facility]    [address]    [place], [place] [address]  NPI: [id number]  Tax ID: [id number]  Phone: [phone]  Fax: [phone]    Please use fax number [phone] to provide authorization for hospital services or to request additional information.        09/04/2015   Continues on Telemetry Unit, Q4H VS.     Pulmonology Notes:   1. Pneumonia: Pt with multilobar pneumonia with influenza B and probable bacterial superinfection. She is improving and less dyspneic. Would continue with antibiotics as per ID. I do not think she would benefit from steroids at this time but will consider it should she not continue to progress. Her elevated CRP is from ongoing infection.   2. Hypoxemia: improving sats and now 95% on 4 liter O2.   3. Cough: continue with as needed cough meds.     ID Notes:  1. influenza B / multifocal PNA / cough  - completed course of oseltamivir  - completed 5 D azitrho  - on ceftriaxone; will likely d/c in next few days/ convert to PO  - diuresis as per pulm/ primary  - residual cough ; likely post infection inflammatory / reactive airway  - clinically she is starting to feel better    2. Hypoxia- wean O2 as tolerated    3. D/w infection control ok to keep off droplet ; last temp 5/16; completed treatment for flu    4. Fevers/ leukocytosis; due to above    Attending Notes:   1. Severe Sepsis on arrival: influenza B pna and also posible is Superimposed Bacterial pneumonia.   -Urine Legionella and strep antigens negative  -was on CAP tx with Ceftriaxone, Zithro and started on Tamiflu(finished 5 day course)    -Switched to ceftaroline for broader coverage as pt was slow to recover but now back on ceftriaxone 2g/day  - MRSA screen negative  - CT 5/15noted with multifocal bilateral infiltrate.   Fever curve and wbc wnl now.     2. acute Hypoxic resp failure sec to #1 likely  -Persistent hypoxia but slowly improving. Now on 3L. As above  -in addition, now on standing DuoNeb's every 4 hours while awake, BNP slightly elevated, but echocardiogram without evidence for congestive heart failure. Did get a dose of Lasix,  -Continue O2 supplement, weening   -pulm consult appreciated. Inflammatory markers up    3. Mild thrombocytopenia probably from Infection    4. GERD on PPI    5. Headache has hx of Migraine HA   Appreciate neuro    6. Hypokalemia replete again today    DVT Prophylaxis: Lovenox 40 mg subQ daily LABS:    09/04/2015 06:52   Hemoglobin 10.8 (L)   Hematocrit 31.1 (L)   RBC 3.45 (L)      09/04/2015 06:53   Glucose 102 (H)   BUN 5 (L)   Potassium 3.4 (L)   CO2 31 (H)   Calcium 8.1 (L)      MEDS:  Duo-Neb 2.5-0.5mg  Neb Q4H   Tessalon 100mg  TID   Rocephin 2gm IV Q24H  Lovenox 40mg  SQ QD   Toradol 30mg  IV Q6H   Protonix 40mg  BID   Potassium Chloride BID   Potassium Chloride x 1     Tylenol 650mg  Q4H PRN x 1  IMAGING:  None     09/05/2015   Continues on Telemetry Unit, Q4H VS.     Temp:  [96.3 F (35.7 C)-100.3 F (37.9 C)] 96.5 F (35.8 C)  Heart Rate:  [90-112] 100  Resp Rate:  [18-24] 18  BP: (128-144)/(57-80) 128/57 mmHg    Attending Notes:   LABS:    09/05/2015 06:09   Hemoglobin 11.1 (L)   Hematocrit 32.4 (L)   Platelet Count 423 (H)   RBC 3.56 (L)   Absolute Myelocyte 0.07 (H)      MEDS:  Tessalon 100mg  TID   Rocephin 2gm IV Q24H   Lovenox 40mg  SQ QD   Protonix 40mg  BID   Potassium Chloride x 1     Tylenol 650mg  Q4H PRN x 1  IMAGING:  CXR   Ordered

## 2015-09-05 NOTE — Plan of Care (Signed)
Problem: Safety  Goal: Patient will be free from injury during hospitalization  Outcome: Progressing  Pt bed in low locked position, side rails up x2, call bell within reach, non skid footwear in use when ambulating, bed alarm on, hourly rounding.

## 2015-09-05 NOTE — Progress Notes (Signed)
Patient: [name]  Date: 09/05/2015   LOS: 7 Days  Admission Date: 08/29/2015   MRN: [id number]  Attending: Krystal Eaton MD     ASSESSMENT/PLAN     [name] is a 55 y.o. female admitted with Fever/cough    Assessment and Plan:    1. Severe Sepsis on arrival: influenza B pna and also posible is  Superimposed  Bacterial pneumonia.   -Urine Legionella and strep antigens negative  -was on CAP tx with  Ceftriaxone, Zithro and started on  Tamiflu(finished 5 day course)    -Switched to ceftaroline for broader coverage as pt was slow to recover but now back on ceftriaxone 2g/day  - MRSA screen negative  - CT 5/15noted with multifocal bilateral infiltrate.   Fever curve and wbc wnl now but still with low grade temp. Today's xray shows some residual improvement  -Section disease input appreciated    2. acute Hypoxic resp failure sec to #1 likely  -Persistent hypoxia but slowly improving. Now on 2L.   As above  - BNP slightly elevated, but echocardiogram without evidence for congestive heart failure.  Did get a dose of Lasix,  -Continue O2 supplement, weening, ambulate later and see post ambulation, oxygen sats  -pulm consult appreciated.  Inflammatory markers up      3. Mild thrombocytopenia probably from Infection  resolved    4. GERD on PPI    5. Headache has hx of Migraine HA (follows with Dr Hamilton Capri)  Better. Appreciate neuro    6. Hypokalemia replete as needed      Nutrition: Regular Diet    GI Prophylaxis: PPI    DVT Prophylaxis: Lovenox 40 mg subQ daily       Code Status: full code    DISPO:   pending    Safety Checklist  Foley: Not present   IVs:  Peripheral IV   PT/OT: Not needed   Daily CBC & or Chem ordered:   Yes                 SUBJECTIVE     [name] states she feels.  Still deep breathing triggers cough reflex.  Ambulated around telemetry yesterday.  Denies diarrhea, abdominal pain.  Low-grade temperature but no fever    MEDICATIONS     Current Facility-Administered  Medications   Medication Dose Route Frequency   . benzonatate  100 mg Oral TID   . cefTRIAXone  2 g Intravenous Q24H SCH   . dextromethorphan polistirex ER  30 mg Oral Q12H SCH   . enoxaparin  40 mg Subcutaneous Daily   . pantoprazole  40 mg Oral BID AC   . potassium chloride  40 mEq Oral Once       ROS     Remainder of 10 point ROS as above or otherwise negative    PHYSICAL EXAM     Filed Vitals:    09/05/15 1133   BP: 125/75   Pulse: 90   Temp: 97.4 F (36.3 C)   Resp: 19   SpO2: 93%       Temperature: Temp  Min: 96.3 F (35.7 C)  Max: 100.3 F (37.9 C)  Pulse: Pulse  Min: 90  Max: 112  Respiratory: Resp  Min: 18  Max: 24  Non-Invasive BP: BP  Min: 125/75  Max: 144/75  Pulse Oximetry SpO2  Min: 91 %  Max: 95 %    Intake and Output Summary (Last 24 hours) at Date Time  No intake or output data in the 24 hours ending 09/05/15 1144  In Mod pain  GEN APPEARANCE: Normal;  A&OX3  HEENT: EOMI;   NECK: Supple;   CVS: tachy, S1, S2; No M/G/R  LUNGS: No Significant wheezing No rales  ABD: Soft; No TTP; + Normoactive BS  EXT: no pedal edema;   SKIN: No rash or Lesions  NEURO:  No Focal neurological deficits      LABS       Recent Labs  Lab 09/05/15  0609 09/04/15  0652 09/03/15  0831   WBC 6.64 7.68 8.61   RBC 3.56* 3.45* 3.70*   HGB 11.1* 10.8* 11.5*   HEMATOCRIT 32.4* 31.1* 33.3*   MCV 91.0 90.1 90.0   PLATELETS 423* 339 311         Recent Labs  Lab 09/04/15  0653 09/03/15  0831 09/02/15  0701 09/01/15  0621 08/31/15  0649   SODIUM 144 143 144 144 146*   POTASSIUM 3.4* 3.0* 3.1* 3.3* 4.2   CHLORIDE 107 110 111 112* 116*   CO2 31* 27 24 22 27    BUN 5* 6* 8 8 9    CREATININE 0.7 0.7 0.8 0.8 0.8   GLUCOSE 102* 125* 77 76 97   CALCIUM 8.1* 7.9* 7.8* 8.4* 8.1*   MAGNESIUM  --   --  1.7 1.9 1.7                         Microbiology Results     Procedure Component Value Units Date/Time    Blood culture #1 (Aerobic / Anaerobic) [[id number]] Collected:  08/29/15 0851    Specimen Information:  Arm from Blood Updated:  08/29/15 1516     Narrative:      RAC  1 BLUE+1 PURPLE    Blood culture #2  (Aerobic / Anaerobic) [[id number]] Collected:  08/29/15 0859    Specimen Information:  Arm from Blood Updated:  08/29/15 1516    Narrative:      Rhand  1 BLUE+1 PURPLE    Legionella antigen, urine [[id number]] Collected:  08/29/15 1927    Specimen Information:  Urine from Urine, Clean Catch Updated:  08/30/15 0513    Narrative:      ORDER#: [id number]                                    ORDERED BY: Loleta Dicker  SOURCE: Urine, Clean Catch                           COLLECTED:  08/29/15 19:27  ANTIBIOTICS AT COLL.:                                RECEIVED :  08/30/15 00:37  Legionella, Rapid Urinary Antigen          FINAL       08/30/15 05:13  08/30/15   Negative for Legionella pneumophila Serogroup 1 Antigen             Limitations of Test:             1. Negative results do not exclude infection with Legionella                pneumophila Serogroup 1.  2. Does not detect other serogroups of L. pneumophila                or other Legionella species.             Test Reference Range: Negative      Rapid influenza A/B antigens [[id number]] Collected:  08/29/15 0853    Specimen Information:  Nasopharyngeal from Nasal Aspirate Updated:  08/29/15 0913    Narrative:      ORDER#: [id number]                                    ORDERED BY: Cari Caraway  SOURCE: Nasal Aspirate                               COLLECTED:  08/29/15 08:53  ANTIBIOTICS AT COLL.:                                RECEIVED :  08/29/15 08:59  Influenza Rapid Antigen A&B                FINAL       08/29/15 09:13   +  08/29/15   Positive for Influenza B and Negative for Influenza A             Reference Range: Negative      STREP PNEUMONIA, RAPID URINARY ANTIGEN [[id number]] Collected:  08/29/15 1927    Specimen Information:  Urine, Clean Catch Updated:  08/30/15 0513    Narrative:      ORDER#: [id number]                                    ORDERED BY: Loleta Dicker  SOURCE: Urine, Clean  Catch                           COLLECTED:  08/29/15 19:27  ANTIBIOTICS AT COLL.:                                RECEIVED :  08/30/15 00:37  S. pneumoniae, Rapid Urinary Antigen       FINAL       08/30/15 05:12  08/30/15   Negative for Streptococcus pneumoniae Urinary Antigen             Note:             This is a presumptive test for the direct qualitative             detection of bacterial antigen. This test is not intended as             a substitute for a gram stain and bacterial culture. Samples             with extremely low levels of antigen may yield negative             results.             Reference Range: Negative      Urine culture [[id number]] Collected:  08/29/15 1032    Specimen Information:  Urine from Urine, Clean Catch Updated:  08/30/15 1222  Narrative:      ORDER#: [id number]                                    ORDERED BY: Cari Caraway  SOURCE: Urine, Clean Catch                           COLLECTED:  08/29/15 10:32  ANTIBIOTICS AT COLL.:                                RECEIVED :  08/29/15 14:21  Culture Urine                              FINAL       08/30/15 12:22  08/30/15   No growth of >1,000 CFU/ML, No further work             RADIOLOGY     Radiological Procedure reviewed.    XR Chest 2 Views   Final Result    Slight interval improvement.      Wilmon Pali, MD    09/05/2015 11:26 AM         XR Chest AP Portable   Final Result         Interval progression of bilateral interstitial opacities and basilar   opacities, and new left pleural effusion, which may reflect edema and/or   infiltrate.      Georgiana Spinner, MD    09/03/2015 12:45 PM         Echocardiogram Adult Complete W Clr/ Dopp Waveform   Final Result         1.  The quality of the study is technically adequate for interpretation.   2.  The left ventricle is normal in size. Left ventricular systolic   function is normal. There are no discrete regional wall motion   abnormalities. Estimated EF is  65%. There is no left ventricular    hypertrophy. There is no Doppler evidence of reduced left ventricular   compliance.   3.  The left atrium is normal in size.   4.  The aortic valve is trileaflet. Normal valve excursion is present.   There is no aortic insufficiency. No aortic stenosis is present. The   aortic root is normal in size.   5.  The mitral valve is structurally normal. Mild, centrally directed,   mitral regurgitation is present.   6.  The right ventricle is normal in size and contractility.   7.  The right atrium is normal in size.   8.  The tricuspid valve is structurally normal. There is trace tricuspid   regurgitation.   9.  The pulmonic valve is structurally normal. There is trace pulmonic   insufficiency.   10.  There is no Doppler evidence of pulmonary hypertension.   11.  No pericardial effusion, intracardiac thrombi, or masses are   visualized.   12.  The atrial septum is structurally normal, without shunt by color   flow Doppler.   13.  No prior studies are available for comparison.      Lanette Hampshire, MD    09/03/2015 11:26 AM         CT Chest WO Contrast   Final Result  1. Diffuse bilateral airspace disease greatest in the upper lobes   concerning for pneumonia. Superimposed edema not excluded. Follow-up for   resolution is recommended.   2. Small bilateral pleural effusions.   3. 6 mm left lower lobe pulmonary nodule.   4. Right thyroid nodule.      Kennyth Lose, MD    09/01/2015 6:12 PM         Chest AP Portable   Final Result         Right upper lobe infiltrate concerning for pneumonia      J. Carole Binning, MD    08/29/2015 8:41 AM             Signed,  Krystal Eaton MD  11:44 AM 09/05/2015

## 2015-09-05 NOTE — Progress Notes (Signed)
Patient ambulating in hallway. Much improved today. Oxygen down to 2L. Possible [place] in 1-2 days.

## 2015-09-06 LAB — BASIC METABOLIC PANEL
Anion Gap: 7 (ref 5.0–15.0)
BUN: 6 mg/dL — ABNORMAL LOW (ref 7–19)
CO2: 30 mEq/L — ABNORMAL HIGH (ref 22–29)
Calcium: 8.6 mg/dL (ref 8.5–10.5)
Chloride: 106 mEq/L (ref 100–111)
Creatinine: 0.7 mg/dL (ref 0.6–1.0)
Glucose: 96 mg/dL (ref 70–100)
Potassium: 4.3 mEq/L (ref 3.5–5.1)
Sodium: 143 mEq/L (ref 136–145)

## 2015-09-06 LAB — GFR: EGFR: >60

## 2015-09-06 NOTE — Progress Notes (Signed)
ID PROGRESS NOTE    Date Time: 09/06/2015 7:30 AM  Patient Name: MAYANNA, GARLITZ ANN        Subjective:     Yesterday ; tachycardic and mild desats with walking pulse ox & without O2  Remains on 2 Liters  Denied dyspnea  Cough improved; no longer with sputum  Denied CP/ abd pain/ F/C//N/V/D  No new rashes ( just psoriatic rash)    Medications:     Ceftriaxone # 4 - total # 9    S/p ceftaroline  S/p oseltamivir -10 doses  S/p azithro X 5 d     Lines:     CENTRAL LINES: NONE  R arm P-IV no erythema/ nontender    Review of Systems:     As above    Physical Exam:     Filed Vitals:    09/06/15 0428   BP: 136/79   Pulse: 87   Temp: 96.9 F (36.1 C)   Resp: 36   SpO2: 95% 2 Liters     TMAX:  97.4    GEN: NAD ; non-toxic; not coughing today  HEENT: AT/ NC  CV: RRR no mumurs  PULM:decreased BS bilaterally; no rales; scant upper lobe end expiratory wheeze ;   ABD: soft nontender nabs. No guarding or rebound  EXT: NO edema BLE  DERM: psoriatic plaques over back ; Lateral Left thigh - stable    Labs:       Recent Labs  Lab 09/05/15  0609 09/04/15  0652 09/03/15  0831   WBC 6.64 7.68 8.61   RBC 3.56* 3.45* 3.70*   HGB 11.1* 10.8* 11.5*   HEMATOCRIT 32.4* 31.1* 33.3*   MCV 91.0 90.1 90.0   MCHC 34.3 34.7 34.5   RDW 14 13 13    MPV 9.7 9.9 10.0   PLATELETS 423* 339 311         Recent Labs  Lab 09/06/15  0622 09/04/15  0653 09/03/15  0831 09/02/15  0701 09/01/15  0621 08/31/15  0649   SODIUM 143 144 143 144 144 146*   POTASSIUM 4.3 3.4* 3.0* 3.1* 3.3* 4.2   CHLORIDE 106 107 110 111 112* 116*   CO2 30* 31* 27 24 22 27    BUN 6* 5* 6* 8 8 9    CREATININE 0.7 0.7 0.7 0.8 0.8 0.8   GLUCOSE 96 102* 125* 77 76 97   CALCIUM 8.6 8.1* 7.9* 7.8* 8.4* 8.1*   MAGNESIUM  --   --   --  1.7 1.9 1.7             MICRO  5/15 MRSA cx negative  5/12 urine legionella & strep antigen  5/12 bcx ngtd  5/12 flu B positive    Rads:   5/19 cxr Slight interval improvement.    5/17 CXR  Interval progression of bilateral interstitial opacities and basilar  opacities,   and new left pleural effusion, which may reflect edema and/or infiltrate.     5/17 2 d echo: no mention of vegetation    5/15 ct chest 1. Diffuse bilateral airspace disease greatest in the upper lobes  concerning for pneumonia. Superimposed edema not excluded. Follow-up for  resolution is recommended.  2. Small bilateral pleural effusions.  3. 6 mm left lower lobe pulmonary nodule.  4. Right thyroid nodule.    Assessment:      55 year old female  Hx of psoriasis ( skin)    Admitted with 2 weeks fevers/ cough    Hypoxia  Multifocal pneumonia ;     Bilateral pulmonary effusions    LLL 6 MM pulm nodule.      reactive thrombocytosis    Plan:     1. influenza B / multifocal PNA / cough  - completed course of oseltamivir  - completed 5 D azitrho  - on ceftriaxone; will likely d/c after tomorrow's dose  - residual cough ; likely post infection inflammatory / reactive airway  - clinically she is starting to feel better  - repeat cxr 5/19 improved    2. Hypoxia  - wean O2 as tolerated   -seems comfortable at rest; desats without O2 & with activity    3.  D/w infection control  5/18 - okay to keep off of isolation     4. Fevers  - Isolated 100, low-grade temp 5/19 ; likely from ATX ; as CXR better;  - cont with IS    5. leukocytosis; due to above ; better/resolved    D/w pt & Rn Judeth Cornfield    Signed by: [place] [id number]

## 2015-09-06 NOTE — Progress Notes (Signed)
Active Hospital Problems    Diagnosis   . Pneumonia of right upper lobe due to infectious organism   . Pneumonia   PROGRESS NOTE    Date Time: 09/06/2015 9:55 AM  Patient Name: [name], [name]      Assessment/Plan   1. Pneumonia: Pt with multilobar pneumonia with influenza B and probable bacterial superinfection.  She continues to improve and is stable on  2 liter NC, but desats with exertion.  Follow up CXR had slight improvement and will take weeks to resolve.   2. Hypoxemia: Stable sats   94% on 2 liter O2.   3. Cough: continue with as needed cough meds.   Possible [place] tomorrow if off oxygen.     Subjective:   The patient is a 55 year old female without significant past medical  history who developed cough and fevers 2 weeks ago. The patient initially  presented with a cough and fevers.  She feels better today with less cough, shortness of breath and improved appetite.  No further low grade fevers last night.  She denies chest pain or wheeze, but has continued cough without sputum.      Medications:     Current Facility-Administered Medications   Medication Dose Route Frequency   . benzonatate  100 mg Oral TID   . cefTRIAXone  2 g Intravenous Q24H SCH   . dextromethorphan polistirex ER  30 mg Oral Q12H SCH   . enoxaparin  40 mg Subcutaneous Daily   . pantoprazole  40 mg Oral Daily       Review of Systems:   A comprehensive review of systems was: Negative except cough and dyspnea    Physical Exam:     Filed Vitals:    09/06/15 0822   BP: 132/69   Pulse: 89   Temp: 96.1 F (35.6 C)   Resp: 20   SpO2: 94%       Intake and Output Summary (Last 24 hours) at Date Time    Intake/Output Summary (Last 24 hours) at 09/06/15 0955  Last data filed at 09/05/15 1700   Gross per 24 hour   Intake    340 ml   Output      0 ml   Net    340 ml       General appearance - acyanotic, in no respiratory distress  Mental status - alert, oriented to person, place, and time  Neck - supple, no significant adenopathy  Lymphatics - no  palpable lymphadenopathy, no hepatosplenomegaly  Chest - diminished breath sounds at bases    Heart - normal rate, regular rhythm, normal S1, S2, no murmurs, rubs, clicks or gallops  Abdomen - soft, nontender, nondistended, no masses or organomegaly  Neurological - alert, oriented, normal speech, no focal findings or movement disorder noted  Extremities - peripheral pulses normal, no pedal edema, no clubbing or cyanosis  Skin - normal coloration and turgor, no rashes, no suspicious skin lesions noted       Labs:     Results     Procedure Component Value Units Date/Time    Basic Metabolic Panel [[id number]]  (Abnormal) Collected:  09/06/15 0622    Specimen Information:  Blood Updated:  09/06/15 0706     Glucose 96 mg/dL      BUN 6 (L) mg/dL      Creatinine 0.7 mg/dL      Calcium 8.6 mg/dL      Sodium 409 mEq/L      Potassium 4.3 mEq/L  Chloride 106 mEq/L      CO2 30 (H) mEq/L      Anion Gap 7.0     GFR [[id number]] Collected:  09/06/15 0622     EGFR >60.0 Updated:  09/06/15 0706          Recent CBC   No results for input(s): RBC, HGB, HCT, MCV, MCH, MCHC, RDW, MPV, LABPLAT in the last 24 hours.    Invalid input(s): WHITEBLOODCE,  NRBCA,  REFLX,  ANRBA    Rads:   Radiological Procedure reviewed.    Signed by: Angelina Sheriff    [phone]

## 2015-09-06 NOTE — Plan of Care (Signed)
Problem: Pain  Goal: Patient's pain/discomfort is manageable  Outcome: Progressing  No complaint of pain today. No headache or migraine either . Will continue to assess and intervene if needed    Problem: Inadequate Gas Exchange  Goal: Adequately oxygenating and ventilation is improved  Outcome: Not Progressing  Pt has been on oxygen since admission. Tried to wean her from oxygen on exertion, but in vain because the oxygen saturation drops to 86% while ambulating on room air. Will continue to assess  Respiratory status.

## 2015-09-06 NOTE — Progress Notes (Signed)
PULSE OXIMETRY TESTING:   Document patient's oxygen at rest on room air:   ____91_% on room air, at rest (No ranges please)   ___94__% on oxygen, at rest at___2__LPM via NC   IF 88% OR BELOW on room air, STOP HERE,   IF NOT ambulate patient on room air with exertion and document below:   ___86__% on room air, with exertion (must be 88% or below)   ___92__% on oxygen, with exertion, at___3__LPM via NC   All three tests must be during the same session.   Please document in a PROGRESS NOTE.   Testing to qualify for home oxygen must be no earlier than 48 hours prior to discharge, or it will need to be repeated.   Please call the home health care liaison at [phone] or case manager when completed.

## 2015-09-06 NOTE — Progress Notes (Signed)
PROGRESS NOTE    Date Time: 09/06/2015 6:06 PM  Patient Name: [name]    Assessment/ Plan:   [name] is a 55 y.o. female with GERD and lumbar stenosis patients with fever and productive cough with subsequent finding suggestive of influenza and pneumonia:    1.Influenza B pneumonia and also posible Superimposed Bacterial Bilateral pneumonia.   -Urine Legionella and strep antigens negative  -was on CAP tx with Ceftriaxone, Zithro and started on Tamiflu(finished 5 day course)   -Switched to ceftaroline for broader coverage as pt was slow to recover but now back on ceftriaxone 2g/day  - MRSA screen negative  - CT 5/15noted with multifocal bilateral infiltrate.   -I D and Pulmonary input appreciated    2.Aacute Hypoxic resp failure sec to #1 likely  -Persistent hypoxia but slowly improving. Now on 2L. As above  - BNP slightly elevated, but echocardiogram without evidence for congestive heart failure. Did get a dose of Lasix,  -Continue O2 supplement, weening, ambulate later and see post ambulation, oxygen sats  -pulm consult appreciated. Inflammatory markers up      3. Mild thrombocytopenia   -probably from Infection  resolved    4. GERD  - on PPI    5. Headache has hx of Migraine HA (follows with Dr Hamilton Capri)  -Better. Appreciate neuro    6. Hypokalemia replete as needed    7: Sepsis on admission due to No. 1:   -resolved        DVT Proph: Lovenox    Code Status: Full Code    Subjective:   CC  Patient is feeling better today.  She still has mild dry cough but no fever or chest pain.  Mild difficulty in breathing on exertion and continues to use oxygen.  No chills    Medications:     Current Facility-Administered Medications   Medication Dose Route Frequency   . benzonatate  100 mg Oral TID   . cefTRIAXone  2 g Intravenous Q24H SCH   . dextromethorphan polistirex ER  30 mg Oral Q12H SCH   . enoxaparin  40 mg Subcutaneous Daily   . pantoprazole  40 mg Oral Daily            Review of  Systems:   A comprehensive review of systems was: Negative except for  General ROS: positive for  - chills, fever and malaise  negative for - night sweats or sleep disturbance  Psychological ROS: negative  Respiratory ROS: positive for - cough and shortness of breath  negative for - hemoptysis or wheezing  Cardiovascular ROS: positive for - shortness of breath  negative for - chest pain  Gastrointestinal ROS: no abdominal pain, change in bowel habits, or black or bloody stools  Genito-Urinary ROS: no dysuria, trouble voiding, or hematuria  Musculoskeletal ROS: negative  Neurological ROS: no TIA or stroke symptoms  Dermatological ROS: negative    Physical Exam:     Filed Vitals:    09/06/15 0428 09/06/15 0822 09/06/15 1148 09/06/15 1615   BP: 136/79 132/69 124/75 125/72   Pulse: 87 89 76 88   Temp: 96.9 F (36.1 C) 96.1 F (35.6 C) 97 F (36.1 C) 98 F (36.7 C)   TempSrc: Oral Oral Oral Oral   Resp: 36 20 19 20    Height:       Weight:       SpO2: 95% 94% 96% 96%         Recent Labs  Lab  09/06/15  0622 09/04/15  1610 09/03/15  0831 09/02/15  0701 09/01/15  0621 08/31/15  0649   GLUCOSE 96 102* 125* 77 76 97       General appearance - alert, well appearing, and in no distress  Mental status - alert, oriented to person, place, and time  Eyes - pupils equal and reactive, extraocular eye movements intact  Ears - bilateral TM's and external ear canals normal  Neck - supple, no significant adenopathy  Lymphatics - no palpable lymphadenopathy, no hepatosplenomegaly  Chest - decreased air entry noted At both bases, rhonchi noted At both bases  Heart - normal rate, regular rhythm, normal S1, S2, no murmurs, rubs, clicks or gallops  Abdomen - soft, nontender, nondistended, no masses or organomegaly  Neurological - alert, oriented, normal speech, no focal findings or movement disorder noted  Musculoskeletal - no joint tenderness, deformity or swelling  Extremities - peripheral pulses normal, no pedal edema, no clubbing or  cyanosis  Skin - normal coloration and turgor, no rashes, no suspicious skin lesions noted        Labs:     Recent Labs  Lab 09/05/15  0609 09/04/15  0652 09/03/15  0831   WBC 6.64 7.68 8.61   HGB 11.1* 10.8* 11.5*   HEMATOCRIT 32.4* 31.1* 33.3*   MCV 91.0 90.1 90.0   RDW 14 13 13    PLATELETS 423* 339 311         Recent Labs  Lab 09/06/15  0622 09/04/15  0653 09/03/15  0831 09/02/15  0701 09/01/15  0621   SODIUM 143 144 143 144 144   POTASSIUM 4.3 3.4* 3.0* 3.1* 3.3*   CHLORIDE 106 107 110 111 112*   CO2 30* 31* 27 24 22    BUN 6* 5* 6* 8 8   CREATININE 0.7 0.7 0.7 0.8 0.8   GLUCOSE 96 102* 125* 77 76   CALCIUM 8.6 8.1* 7.9* 7.8* 8.4*             blood culture: negative      Rads:   Radiological Procedure reviewed.      XR Chest 2 Views [[id number]] Collected: 09/05/15 1125     Order Status: Completed Updated: 09/05/15 1131    Narrative:     Reason for exam: 55 year old female with pneumonia. Exam compared prior  study 09/03/2015.    FINDINGS:  2 views.  Bilateral lung opacities are slightly decreasing in the left lung and  without significant change in the right lung. There are associated small  layering pleural effusions and minimal bibasilar atelectasis. The heart  is normal size. There is no pneumothorax.  Bony thorax reveals mild osteopenia with minimal kyphosis in the spine.  Surgery is seen in the right lower neck.      Impression:     Slight interval improvement.        CT Chest WO Contrast [[id number]] Collected: 09/01/15 1806    Order Status: Completed Updated: 09/01/15 1816    Narrative:     CLINICAL HISTORY: Pneumonia, persistent hypoxemia.    TECHNIQUE: CT of the chest was performed without intravenous contrast.  The following dose reduction techniques were utilized: automated  exposure control and/or adjustment of the mA and/or kV according  to patient size, and the use of iterative reconstruction technique.     FINDINGS: Diffuse groundglass and consolidative airspace opacities  involving all  lobes, greatest in the upper lobes. Findings may represent  pneumonia. Superimposed edema not excluded. 6 mm  pulmonary nodule in the  left lower lobe (image 26, series 3). Small bilateral pleural effusions,  right greater than left. No significant pericardial effusion. Mild  mediastinal adenopathy. A right paratracheal lymph node in the superior  mediastinum measures 1 x 1.4 cm. Limited hilar evaluation due to lack of  intravenous contrast. No axillary adenopathy. Calcified right thyroid  nodule measuring 1.2 cm. Degenerative changes in the spine.      Impression:       1. Diffuse bilateral airspace disease greatest in the upper lobes  concerning for pneumonia. Superimposed edema not excluded. Follow-up for  resolution is recommended.  2. Small bilateral pleural effusions.  3. 6 mm left lower lobe pulmonary nodule.  4. Right thyroid nodule.             Signed by: Clydell Hakim, MD

## 2015-09-07 NOTE — Plan of Care (Signed)
Problem: Safety  Goal: Patient will be free from injury during hospitalization  Outcome: Progressing  Problem: Inadequate Gas Exchange  Goal: Adequately oxygenating and ventilation is improved  Outcome: Not Progressing  Pt's on 2L O2 via NC sats in the high to low 90's. HS meds administered. Pt in and out of bed to the BR, no issues.     Problem: Pain  Goal: Patient's pain/discomfort is manageable  Outcome: Progressing  No complaint of pain today. No c/o headache or migraine. Will continue to assess and reassess.

## 2015-09-07 NOTE — Progress Notes (Addendum)
ID PROGRESS NOTE    Date Time: 09/07/2015 8:15 AM  Patient Name: [name], [name]        Subjective:   Remains on O2  With activity still desats ( off O2) & asymptomatic  Feels better everyday  Denied dyspnea;  Cough improved; no sputum production  Denied CP/ abd pain/ F/C//N/V/D  No new rashes ( just psoriatic rash)    Medications:     Ceftriaxone # 5 - total # 10    S/p ceftaroline  S/p oseltamivir -10 doses  S/p azithro X 5 d     Lines:     CENTRAL LINES: NONE  R arm P-IV no erythema/ nontender    Review of Systems:     As above    Physical Exam:     Filed Vitals:    09/07/15 0813   BP: 106/63   Pulse: 96   Temp: 96 F (35.6 C)   Resp: 17   SpO2: 95% [facility]     Tmax: 98.6    GEN: NAD ; non-toxic; comfortable; not dyspneic with speech  HEENT: AT/ NC  CV: RRR no mumurs  PULM:decreased BS bilaterally; no rales; no wheezing today  ABD: soft nontender nabs. No guarding or rebound  EXT: NO edema BLE  DERM: psoriatic plaques over back ; Lateral Left thigh - slightly more prominent today    Labs:       Recent Labs  Lab 09/05/15  0609 09/04/15  0652 09/03/15  0831   WBC 6.64 7.68 8.61   RBC 3.56* 3.45* 3.70*   HGB 11.1* 10.8* 11.5*   HEMATOCRIT 32.4* 31.1* 33.3*   MCV 91.0 90.1 90.0   MCHC 34.3 34.7 34.5   RDW 14 13 13    MPV 9.7 9.9 10.0   PLATELETS 423* 339 311         Recent Labs  Lab 09/06/15  0622 09/04/15  0653 09/03/15  0831 09/02/15  0701 09/01/15  0621   SODIUM 143 144 143 144 144   POTASSIUM 4.3 3.4* 3.0* 3.1* 3.3*   CHLORIDE 106 107 110 111 112*   CO2 30* 31* 27 24 22    BUN 6* 5* 6* 8 8   CREATININE 0.7 0.7 0.7 0.8 0.8   GLUCOSE 96 102* 125* 77 76   CALCIUM 8.6 8.1* 7.9* 7.8* 8.4*   MAGNESIUM  --   --   --  1.7 1.9           MICRO  5/15 MRSA cx negative  5/12 urine legionella & strep antigen  5/12 bcx ngtd  5/15 ucx ngtd  5/12 flu B positive    Rads:   5/19 cxr Slight interval improvement.    5/17 CXR  Interval progression of bilateral interstitial opacities and basilar opacities,   and new left pleural  effusion, which may reflect edema and/or infiltrate.     5/17 2 d echo: no mention of vegetation    5/15 ct chest 1. Diffuse bilateral airspace disease greatest in the upper lobes  concerning for pneumonia. Superimposed edema not excluded. Follow-up for  resolution is recommended.  2. Small bilateral pleural effusions.  3. 6 mm left lower lobe pulmonary nodule.  4. Right thyroid nodule.    Assessment:      55 year old female  Hx of psoriasis ( skin)    Admitted with 2 weeks fevers/ cough    Hypoxia    Multifocal pneumonia ;     Bilateral pulmonary effusions    LLL  6 MM pulm nodule.      reactive thrombocytosis    Plan:     1. influenza B / multifocal PNA / cough  - completed course of oseltamivir  - completed 5 D azitrho  - on ceftriaxone; last dose is today  - residual  resp sxs/ o2 dependency; likely post infection inflammatory / reactive airway  - clinically she is  better  - repeat cxr 5/19 improved  - wean O2 as tolerated ( still desats off O2 with activity)    2. Hypoxia  - wean O2 as tolerated   -seems comfortable at rest; desats without O2 & with activity    3.  D/w infection control  5/18 - okay to keep off of isolation     4. Fevers  - Isolated 100, low-grade temp 5/19 ; likely from ATX ; as CXR better;  - no fevers since  - cont with IS    5. leukocytosis; due to above ; better/resolved er 5/19 labs    6. Rash on back ; L thigh - from psoriasis; pt uses topicals & will have someone bring form home    D/w pt     Signed by: [place] [id number]

## 2015-09-07 NOTE — Progress Notes (Signed)
PULSE OXIMETRY TESTING:   Document patient's oxygen at rest on room air:   94 % on room air, at rest (No ranges please)   ____ % on oxygen, at rest at_____LPM via NC   IF 88% OR BELOW on room air, STOP HERE,   IF NOT ambulate patient on room air...    91% on room air, ambulating around unit.

## 2015-09-07 NOTE — Progress Notes (Signed)
Active Hospital Problems    Diagnosis   . Pneumonia of right upper lobe due to infectious organism   . Pneumonia   PROGRESS NOTE    Date Time: 09/07/2015 8:05 AM  Patient Name: [name], [name]      Assessment/Plan   1. Pneumonia: Pt with multilobar pneumonia with influenza B and probable bacterial superinfection.  She continues to improve and is stable on  2 liter NC, but desats with exertion.  Follow up CXR had slight improvement and will take weeks to resolve.   She is still on ceftriaxone and finished tamiflu.  2. Hypoxemia: Stable sats   95% on 2 liter O2.  Patient may need home oxygen until the pneumonia resolves.   3. Cough: continue with as needed cough meds.      Subjective:   The patient is a 55 year old female without significant past medical  history who developed cough and fevers 2 weeks ago. The patient initially  presented with a cough and fevers.  She feels better today with less cough, shortness of breath and improved appetite.  No further low grade fevers last night.  She denies chest pain or wheeze, but has continued cough without sputum.  She has been ambulating in the hallways with oxygen.      Medications:     Current Facility-Administered Medications   Medication Dose Route Frequency   . benzonatate  100 mg Oral TID   . cefTRIAXone  2 g Intravenous Q24H SCH   . dextromethorphan polistirex ER  30 mg Oral Q12H SCH   . enoxaparin  40 mg Subcutaneous Daily   . pantoprazole  40 mg Oral Daily       Review of Systems:   A comprehensive review of systems was: Negative except cough.    Physical Exam:     Filed Vitals:    09/07/15 0458   BP: 130/69   Pulse: 88   Temp: 97.7 F (36.5 C)   Resp: 20   SpO2: 95%       Intake and Output Summary (Last 24 hours) at Date Time    Intake/Output Summary (Last 24 hours) at 09/07/15 0805  Last data filed at 09/06/15 0817   Gross per 24 hour   Intake    240 ml   Output      0 ml   Net    240 ml       General appearance - acyanotic, in no respiratory  distress  Mental status - alert, oriented to person, place, and time  Neck - supple, no significant adenopathy  Lymphatics - no palpable lymphadenopathy, no hepatosplenomegaly  Chest - diminished breath sounds at bases    Heart - normal rate, regular rhythm, normal S1, S2, no murmurs, rubs, clicks or gallops  Abdomen - soft, nontender, nondistended, no masses or organomegaly  Neurological - alert, oriented, normal speech, no focal findings or movement disorder noted  Extremities - peripheral pulses normal, no pedal edema, no clubbing or cyanosis  Skin - normal coloration and turgor, no rashes, no suspicious skin lesions noted       Labs:     Results     ** No results found for the last 24 hours. **          Recent CBC   No results for input(s): RBC, HGB, HCT, MCV, MCH, MCHC, RDW, MPV, LABPLAT in the last 24 hours.    Invalid input(s): WHITEBLOODCE,  NRBCA,  REFLX,  ANRBA    Rads:  Radiological Procedure reviewed.    Signed by: Angelina Sheriff    [phone]

## 2015-09-07 NOTE — Progress Notes (Signed)
Report given to medical unit, RN.

## 2015-09-07 NOTE — Progress Notes (Signed)
PROGRESS NOTE    Date Time: 09/07/2015 11:40 AM  Patient Name: [name], [name]    Assessment/ Plan:   [name] is a 55 y.o. female with GERD and lumbar stenosis patients with fever and productive cough with subsequent finding suggestive of influenza and pneumonia:    1.Influenza B pneumonia and also posible Superimposed Bacterial Bilateral pneumonia.   -Urine Legionella and strep antigens negative  -was on CAP tx with Ceftriaxone, Zithro and started on Tamiflu(finished 5 day course)   -Switched to ceftaroline for broader coverage as pt was slow to recover but now back on ceftriaxone 2g/day.  She will receive her last IV Rocephin.  May not need any further antibiotics  from tomorrow.  -Discussed with ID-possible discharge tomorrow.  May need home O2 if still hypoxic tomorrow at [facility].  - MRSA screen negative  - CT 5/15noted with multifocal bilateral infiltrate.   -I D and Pulmonary input appreciated    2.Aacute Hypoxic Resp Failure sec to #1 likely  -Persistent hypoxia but slowly improving. Now on 2L. As above  - BNP slightly elevated, but echocardiogram without evidence for congestive heart failure. Did get a dose of Lasix,  -Continue O2 supplement, weening, ambulate later and see post ambulation, oxygen sats  -pulm consult appreciated. Inflammatory markers up  -May need home O2    3. Mild thrombocytopenia   -probably from Infection.  resolved    4. GERD  - on PPI    5. Headache has hx of Migraine HA (follows with Dr Hamilton Capri)  -Better. Appreciate neuro    6. Hypokalemia replete as needed    7: Sepsis on admission due to No. 1:   -resolved        DVT Proph: Lovenox    Code Status: Full Code    Disposition: Probable discharge tomorrow.   Check room air oxygen saturation prior to discharge.  May need home oxygen.  May not need any oral antibiotics as per ID    Subjective:   CC  Patient is feeling better today.  She still has mild dry cough but no fever or chest pain.  Mild difficulty in  breathing on exertion and continues to use oxygen.  No chills.  Oxygen saturation drops to 86 percent on room air.      Medications:     Current Facility-Administered Medications   Medication Dose Route Frequency   . benzonatate  100 mg Oral TID   . cefTRIAXone  2 g Intravenous Q24H SCH   . dextromethorphan polistirex ER  30 mg Oral Q12H SCH   . enoxaparin  40 mg Subcutaneous Daily   . pantoprazole  40 mg Oral Daily            Review of Systems:   A comprehensive review of systems was: Negative except for  General ROS: positive for  - chills, fever and malaise  negative for - night sweats or sleep disturbance  Psychological ROS: negative  Respiratory ROS: positive for - cough and shortness of breath  negative for - hemoptysis or wheezing  Cardiovascular ROS: positive for - shortness of breath  negative for - chest pain  Gastrointestinal ROS: no abdominal pain, change in bowel habits, or black or bloody stools  Genito-Urinary ROS: no dysuria, trouble voiding, or hematuria  Musculoskeletal ROS: negative  Neurological ROS: no TIA or stroke symptoms  Dermatological ROS: negative    Physical Exam:     Filed Vitals:    09/06/15 1927 09/06/15 2322 09/07/15 1610  09/07/15 0813   BP: 102/71 120/71 130/69 106/63   Pulse: 89 89 88 96   Temp: 98.6 F (37 C) 97.5 F (36.4 C) 97.7 F (36.5 C) 96 F (35.6 C)   TempSrc: Oral Oral Oral    Resp: 20 20 20 17    Height:       Weight:       SpO2: 93% 94% 95% 95%         Recent Labs  Lab 09/06/15  0622 09/04/15  0653 09/03/15  0831 09/02/15  0701 09/01/15  0621   GLUCOSE 96 102* 125* 77 76       General appearance - alert, well appearing, and in no distress  Mental status - alert, oriented to person, place, and time  Eyes - pupils equal and reactive, extraocular eye movements intact  Ears - bilateral TM's and external ear canals normal  Neck - supple, no significant adenopathy  Lymphatics - no palpable lymphadenopathy, no hepatosplenomegaly  Chest - decreased air entry noted At both  bases, rhonchi noted At both bases  Heart - normal rate, regular rhythm, normal S1, S2, no murmurs, rubs, clicks or gallops  Abdomen - soft, nontender, nondistended, no masses or organomegaly  Neurological - alert, oriented, normal speech, no focal findings or movement disorder noted  Musculoskeletal - no joint tenderness, deformity or swelling  Extremities - peripheral pulses normal, no pedal edema, no clubbing or cyanosis  Skin - normal coloration and turgor, no rashes, no suspicious skin lesions noted        Labs:       Recent Labs  Lab 09/05/15  0609 09/04/15  0652 09/03/15  0831   WBC 6.64 7.68 8.61   HGB 11.1* 10.8* 11.5*   HEMATOCRIT 32.4* 31.1* 33.3*   MCV 91.0 90.1 90.0   RDW 14 13 13    PLATELETS 423* 339 311         Recent Labs  Lab 09/06/15  0622 09/04/15  0653 09/03/15  0831 09/02/15  0701 09/01/15  0621   SODIUM 143 144 143 144 144   POTASSIUM 4.3 3.4* 3.0* 3.1* 3.3*   CHLORIDE 106 107 110 111 112*   CO2 30* 31* 27 24 22    BUN 6* 5* 6* 8 8   CREATININE 0.7 0.7 0.7 0.8 0.8   GLUCOSE 96 102* 125* 77 76   CALCIUM 8.6 8.1* 7.9* 7.8* 8.4*             blood culture: negative      Rads:   Radiological Procedure reviewed.      XR Chest 2 Views [[id number]] Collected: 09/05/15 1125     Order Status: Completed Updated: 09/05/15 1131    Narrative:     Reason for exam: 55 year old female with pneumonia. Exam compared prior  study 09/03/2015.    FINDINGS:  2 views.  Bilateral lung opacities are slightly decreasing in the left lung and  without significant change in the right lung. There are associated small  layering pleural effusions and minimal bibasilar atelectasis. The heart  is normal size. There is no pneumothorax.  Bony thorax reveals mild osteopenia with minimal kyphosis in the spine.  Surgery is seen in the right lower neck.      Impression:     Slight interval improvement.        CT Chest WO Contrast [[id number]] Collected: 09/01/15 1806    Order Status: Completed Updated: 09/01/15 1816     Narrative:  CLINICAL HISTORY: Pneumonia, persistent hypoxemia.    TECHNIQUE: CT of the chest was performed without intravenous contrast.  The following dose reduction techniques were utilized: automated  exposure control and/or adjustment of the mA and/or kV according  to patient size, and the use of iterative reconstruction technique.     FINDINGS: Diffuse groundglass and consolidative airspace opacities  involving all lobes, greatest in the upper lobes. Findings may represent  pneumonia. Superimposed edema not excluded. 6 mm pulmonary nodule in the  left lower lobe (image 26, series 3). Small bilateral pleural effusions,  right greater than left. No significant pericardial effusion. Mild  mediastinal adenopathy. A right paratracheal lymph node in the superior  mediastinum measures 1 x 1.4 cm. Limited hilar evaluation due to lack of  intravenous contrast. No axillary adenopathy. Calcified right thyroid  nodule measuring 1.2 cm. Degenerative changes in the spine.      Impression:       1. Diffuse bilateral airspace disease greatest in the upper lobes  concerning for pneumonia. Superimposed edema not excluded. Follow-up for  resolution is recommended.  2. Small bilateral pleural effusions.  3. 6 mm left lower lobe pulmonary nodule.  4. Right thyroid nodule.             Signed by: Clydell Hakim, MD

## 2015-09-07 NOTE — Plan of Care (Signed)
Problem: Inadequate Gas Exchange  Goal: Adequately oxygenating and ventilation is improved  Outcome: Progressing  Weaned patient on room air 93%. Will attempt to do oxygen ambulation test, goal >SpO2 88%. Patient plan for discharge tomorrow if stable.

## 2015-09-08 ENCOUNTER — Other Ambulatory Visit: Payer: Self-pay

## 2015-09-08 DIAGNOSIS — J45909 Unspecified asthma, uncomplicated: Secondary | ICD-10-CM | POA: Diagnosis present

## 2015-09-08 LAB — ANCA SCREEN W/REFLEX TO TITER (SOFT): ANCA Screen: NEGATIVE

## 2015-09-08 MED ORDER — DEXTROMETHORPHAN POLISTIREX ER 30 MG/5ML PO SUER
30.0000 mg | Freq: Two times a day (BID) | ORAL | Status: DC
Start: 2015-09-08 — End: 2016-01-27

## 2015-09-08 MED ORDER — HYDROCODONE-HOMATROPINE 5-1.5 MG/5ML PO SYRP
5.0000 mL | ORAL_SOLUTION | ORAL | 0 refills | Status: DC | PRN
Start: 2015-09-08 — End: 2016-01-27
  Filled 2015-09-08: qty 120, 4d supply, fill #0

## 2015-09-08 MED ORDER — BUTALBITAL-APAP-CAFFEINE 50-325-40 MG PO TABS
1.0000 | ORAL_TABLET | Freq: Four times a day (QID) | ORAL | 0 refills | Status: DC | PRN
Start: 2015-09-08 — End: 2016-01-27
  Filled 2015-09-08: qty 15, 4d supply, fill #0

## 2015-09-08 NOTE — Discharge Instr - Diet (Signed)
Regular

## 2015-09-08 NOTE — Progress Notes (Signed)
INFECTIOUS DISEASE DAILY PROGRESS NOTE        Date Time 09/08/2015 10:07 AM  Patient Name: [name]      Assessment:   No antibiotics.  Complete a 10 day course of IV antibiotics.  Peripheral IV.    55 year old female with a history of migraines and gastroesophageal reflux disease.  Admitted with fever, cough and shortness of breath.  Influenza B positive. She has completed a 5 day course of Tamiflu.    Diffuse multilobar bilateral pneumonia.she has completed a full 10 day course of IV antibiotics.  Hypoxia  is improving.  MRSA swab is negative      Overall, she looks much better.  She feels much  better  Plan:   Making good progress.  I had a lengthy discussion with the patient.  Unclear if this is a primary influenza, pneumonia, or whether she had a secondary  Bacterial infection.  No reason to believe that she has an immune deficiency.  Okay for discharge from my standpoint  No antibiotics.  Gradually increase activity.      I will see her in the office in 2 weeks.  Plan to repeat a chest x-ray in about a month.  I discussed pneumococcal vaccination with her.      Subjective:   She continues to make improvements.      Medications:     Current Facility-Administered Medications   Medication Dose Route Frequency   . benzonatate  100 mg Oral TID   . dextromethorphan polistirex ER  30 mg Oral Q12H SCH   . enoxaparin  40 mg Subcutaneous Daily   . pantoprazole  40 mg Oral Daily       Past Medical History   Diagnosis Date   . PONV (postoperative nausea and vomiting)    . GERD (gastroesophageal reflux disease)    . Arthritis      THUMB   . Rash      PSORIASIS   . Headache(784.0)      MIGRAINES, headache today   . Low back pain      spinal stenosis, right sided pain, no neuro deficits   . 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 Varicose veins      [facility]   . Scoliosis      slight curvature-noted by orthopedist a many years ago   . Anemia      with  pregnancy         Review of Systems:   No headache.  No nausea, vomiting, or diarrhea.  Shortness of breath, improving.  Occasional dry cough.  No urinary complaints.  No muscle aches.  No rash    Physical Exam:     BP 112/66 mmHg  Pulse 95  Temp(Src) 95.3 F (35.2 C) (Axillary)  Resp 19  Ht 1.702 m (5\' 7" )  Wt 64.864 kg (143 lb)  BMI 22.39 kg/m2  SpO2 92%  LMP 09/18/2010   Temp (24hrs), Avg:96.8 F (36 C), Min:95.3 F (35.2 C), Max:98.4 F (36.9 C)       No acute distress.  She looks much better.  Lungs clear.  Heart regular rate and rhythm.  Abdomen soft and nontender.  No rash    Labs:     Results     ** No results found for the last 24 hours. **          Rads:  Radiology Results (24 Hour)     ** No results found for the last 24 hours. **              Signed by: Eldred Manges, MD page [phone]  [facility] [place]

## 2015-09-08 NOTE — Discharge Summary (Signed)
[facility]      Patient: [name]  Admission Date: 08/29/2015   DOB: May 26, 1960  Discharge Date: 09/08/2015    MRN: [id number]  Discharge Attending: Norton Pastel MD   Referring Physician: Bobby Rumpf, MD  PCP: Bobby Rumpf, MD       DISCHARGE SUMMARY     Discharge Information   Admission Diagnosis:   1. Sepsis   2. Influenza B with pneumonia.  Discharge Diagnosis:   Patient Active Problem List    Diagnosis Date Noted   . Reactive airway disease 09/08/2015   . Pneumonia of right upper lobe due to infectious organism    . Pneumonia 08/29/2015   . Stenosis, spinal, lumbar 05/14/2014   . Synovial cyst 02/28/2013        Admission Condition: serious  Discharge Condition: good  Functional Status: Patient is independent with mobility/ambulation, transfers, ADL's, IADL's.    Discharge Medications:     Medication List      START taking these medications          butalbital-acetaminophen-caffeine 50-325-40 MG per tablet   Commonly known as:  FIORICET, ESGIC   Take 1 tablet by mouth every 6 (six) hours as needed.       dextromethorphan polistirex ER 30 MG/5ML suspension   Commonly known as:  DELSYM   Take 5 mLs (30 mg total) by mouth every 12 (twelve) hours.       HYDROcodone-homatropine 5-1.5 MG/5ML syrup   Commonly known as:  HYCODAN   Take 5 mLs by mouth every 4 (four) hours as needed.         CONTINUE taking these medications          benzonatate 100 MG capsule   Commonly known as:  TESSALON       calcipotriene-betamethasone ointment   Commonly known as:  TACLONEX       CALCIUM PO       desonide 0.05 % cream   Commonly known as:  DESOWEN       dexlansoprazole 60 MG capsule       MULTIVITAMIN PO   Notes to Patient:  Take tomorrow       * SUMAtriptan 100 MG tablet   Commonly known as:  IMITREX       * IMITREX IJ       Vitamin D3 5000 units Caps capsule   Commonly known as:  CHOLECALCIFEROL       * Notice:  This list has 2 medication(s) that are the same as other medications  prescribed for you. Read the directions carefully, and ask your doctor or other care provider to review them with you.         Where to Get Your Medications      These medications were sent to [facility]  [address], [place] [address]    Hours:  9AM to 7PM Monday - Friday 10AM to 4PM Saturday Phone:  [phone]    - butalbital-acetaminophen-caffeine 50-325-40 MG per tablet  - HYDROcodone-homatropine 5-1.5 MG/5ML syrup      You can get these medications from any pharmacy     Bring a paper prescription for each of these medications    - dextromethorphan polistirex ER 30 MG/5ML suspension              Hospital Course   Presentation History   55 year old female with past medical history significant  for GERD with history of chronic low  back pain with spinal stenosis status  post lumbar fusion and history of a partial thyroidectomy, who presented to  the ED earlier today due to progressively worsening productive cough with  fever and associated chills. The patient states that her symptoms started  approximately 8 days ago, at which time she had severe sore throat and  myalgias noted. The patient initially felt the symptoms were due to a  viral syndrome and did not seek medical care at that time. She was seen in  her PCP's office approximately 2 days ago, at which time she had a throat  culture performed and Rapid Strep was noted to be negative. The patient  was provided a prescription for Jerilynn Som from PCP office and  instructed to continue supportive care. The patient over the past 24 hours  had significant worsening in symptoms with T-max of 102.6 at home with  ongoing chills and worsening shortness of breath. The patient presented to  the ED earlier today and also noting new onset nausea, mild headaches and  poor p.o. intake. 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 She lives alone and has no other known  sick contacts. She has not had any recent travel. She denies any risk  factors for HIV.      See HPI for details.    Hospital Course (10 Days)   1.Influenza B pneumonia and suspected Superimposed Bacterial Bilateral pneumonia.   Urine Legionella and strep antigens negative  was on CAP tx with Ceftriaxone, Zithro and Tamiflu(finished 5 day course)   Switched to ceftaroline for broader coverage as pt was slow to recover then ceftriaxone 2g/day. Completed altogether 10 days of ABX and no further abx need  Overall improved Cough/RAD  No Home O2 need  MRSA screen negative  CT 5/15noted with multifocal bilateral infiltrate.   Cleared by I D and Pulmonary for home [place] today.    2.Aacute Hypoxic Resp Failure sec to #1  resolved    3. Mild thrombocytopenia   -probably from Infection.  resolved    4. GERD on PPI    5. Headache has hx of Migraine HA (follows with Dr Hamilton Capri)  Better. Seen by neuro. Continue Imitrex and Fiorecet as needed    6. Hypokalemia replaced    7: Sepsis on admission due to No. 1:   resolved        Disposition:   Since doing better Stable for home [place] today. Discussed with ID and Pulm  Procedures/Imaging:   XR Chest 2 Views   Final Result    Slight interval improvement.      Wilmon Pali, MD    09/05/2015 11:26 AM         XR Chest AP Portable   Final Result         Interval progression of bilateral interstitial opacities and basilar   opacities, and new left pleural effusion, which may reflect edema and/or   infiltrate.      Georgiana Spinner, MD    09/03/2015 12:45 PM         Echocardiogram Adult Complete W Clr/ Dopp Waveform   Final Result         1.  The quality of the study is technically adequate for interpretation.   2.  The left ventricle is normal in size. Left ventricular systolic   function is  normal. There are no discrete regional wall motion   abnormalities. Estimated EF is  65%. There is no left ventricular   hypertrophy.  There is no Doppler evidence of reduced left ventricular   compliance.   3.  The left atrium is normal in size.   4.  The aortic valve is trileaflet. Normal valve excursion is present.   There is no aortic insufficiency. No aortic stenosis is present. The   aortic root is normal in size.   5.  The mitral valve is structurally normal. Mild, centrally directed,   mitral regurgitation is present.   6.  The right ventricle is normal in size and contractility.   7.  The right atrium is normal in size.   8.  The tricuspid valve is structurally normal. There is trace tricuspid   regurgitation.   9.  The pulmonic valve is structurally normal. There is trace pulmonic   insufficiency.   10.  There is no Doppler evidence of pulmonary hypertension.   11.  No pericardial effusion, intracardiac thrombi, or masses are   visualized.   12.  The atrial septum is structurally normal, without shunt by color   flow Doppler.   13.  No prior studies are available for comparison.      Lanette Hampshire, MD    09/03/2015 11:26 AM         CT Chest WO Contrast   Final Result       1. Diffuse bilateral airspace disease greatest in the upper lobes   concerning for pneumonia. Superimposed edema not excluded. Follow-up for   resolution is recommended.   2. Small bilateral pleural effusions.   3. 6 mm left lower lobe pulmonary nodule.   4. Right thyroid nodule.      Kennyth Lose, MD    09/01/2015 6:12 PM         Chest AP Portable   Final Result         Right upper lobe infiltrate concerning for pneumonia      J. Carole Binning, MD    08/29/2015 8:41 AM             Treatment Team:   Consulting Physician: Dawayne Patricia, MD  Consulting Physician: Francee Piccolo, MD  Consulting Physician: Lynann Bologna, MD               Progress Note/Physical Exam at Discharge     Subjective:   Feels much better  Minimal dry cough  No fever  HA resolved  Eager to go home    Filed Vitals:    09/07/15 1923 09/07/15 2349 09/08/15 0440 09/08/15 0747   BP: 104/58  100/54 116/60 112/66   Pulse: 93 89 90 95   Temp: 97.4 F (36.3 C) 96.8 F (36 C) 98.4 F (36.9 C) 95.3 F (35.2 C)   TempSrc: Oral Oral Oral Axillary   Resp: 18 18 18 19    Height:       Weight:       SpO2: 93% 92% 91% 92%       General: NAD, AAOx3  HEENT: perrla, eomi, sclera anicteric, OP: Clear, MMM  Neck: supple, FROM, no LAD  Cardiovascular: RRR, no m/r/g  Lungs: CTAB, no w/r/r  Abdomen: soft, +BS, NT/ND, no masses, no g/r  Extremities: no C/C/E  Skin: no rashes or lesions noted  Neuro: CN 2-12 intact; No Focal neurological deficits       Diagnostics  Labs/Studies Pending at Discharge: No    Last Labs     Recent Labs  Lab 09/05/15  0609 09/04/15  0652 09/03/15  0831   WBC 6.64 7.68 8.61   RBC 3.56* 3.45* 3.70*   HGB 11.1* 10.8* 11.5*   HEMATOCRIT 32.4* 31.1* 33.3*   MCV 91.0 90.1 90.0   PLATELETS 423* 339 311         Recent Labs  Lab 09/06/15  0622 09/04/15  0653 09/03/15  0831 09/02/15  0701   SODIUM 143 144 143 144   POTASSIUM 4.3 3.4* 3.0* 3.1*   CHLORIDE 106 107 110 111   CO2 30* 31* 27 24   BUN 6* 5* 6* 8   CREATININE 0.7 0.7 0.7 0.8   GLUCOSE 96 102* 125* 77   CALCIUM 8.6 8.1* 7.9* 7.8*   MAGNESIUM  --   --   --  1.7             Microbiology Results     Procedure Component Value Units Date/Time    Blood culture #1 (Aerobic / Anaerobic) [[id number]] Collected:  08/29/15 0851    Specimen Information:  Arm from Blood Updated:  09/03/15 1721    Narrative:      ORDER#: [id number]                                    ORDERED BY: Cari Caraway  SOURCE: Blood arm                                    COLLECTED:  08/29/15 08:51  ANTIBIOTICS AT COLL.:                                RECEIVED :  08/29/15 15:16  Culture Blood Aerobic and Anaerobic        FINAL       09/03/15 17:21  09/03/15   No growth after 5 days of incubation.      Blood culture #2  (Aerobic / Anaerobic) [[id number]] Collected:  08/29/15 0859    Specimen Information:  Arm from Blood Updated:  09/03/15 1721    Narrative:      ORDER#: [id number]                                     ORDERED BY: Cari Caraway  SOURCE: Blood arm                                    COLLECTED:  08/29/15 08:59  ANTIBIOTICS AT COLL.:                                RECEIVED :  08/29/15 15:16  Culture Blood Aerobic and Anaerobic        FINAL       09/03/15 17:21  09/03/15   No growth after 5 days of incubation.      Legionella antigen, urine [[id number]] Collected:  08/29/15 1927    Specimen Information:  Urine from Urine, Clean Catch Updated:  08/30/15 1027  Narrative:      ORDER#: [id number]                                    ORDERED BY: Loleta Dicker  SOURCE: Urine, Clean Catch                           COLLECTED:  08/29/15 19:27  ANTIBIOTICS AT COLL.:                                RECEIVED :  08/30/15 00:37  Legionella, Rapid Urinary Antigen          FINAL       08/30/15 05:13  08/30/15   Negative for Legionella pneumophila Serogroup 1 Antigen             Limitations of Test:             1. Negative results do not exclude infection with Legionella                pneumophila Serogroup 1.             2. Does not detect other serogroups of L. pneumophila                or other Legionella species.             Test Reference Range: Negative      MRSA culture [[id number]] Collected:  09/01/15 1250    Specimen Information:  Body Fluid from Nasal/Throat ASC Admission Updated:  09/02/15 1930    Narrative:      ORDER#: [id number]                                    ORDERED BY: Garret Reddish  SOURCE: Nares and Throat                             COLLECTED:  09/01/15 12:50  ANTIBIOTICS AT COLL.:                                RECEIVED :  09/01/15 21:41  Culture MRSA Surveillance                  FINAL       09/02/15 19:29  09/02/15   Negative for Methicillin Resistant Staph aureus from Nares and             Negative for Methicillin Resistant Staph aureus from Throat      Rapid influenza A/B antigens [[id number]] Collected:  08/29/15 0853    Specimen Information:  Nasopharyngeal from Nasal Aspirate  Updated:  08/29/15 0913    Narrative:      ORDER#: [id number]                                    ORDERED BY: Cari Caraway  SOURCE: Nasal Aspirate                               COLLECTED:  08/29/15  08:53  ANTIBIOTICS AT COLL.:                                RECEIVED :  08/29/15 08:59  Influenza Rapid Antigen A&B                FINAL       08/29/15 09:13   +  08/29/15   Positive for Influenza B and Negative for Influenza A             Reference Range: Negative      STREP PNEUMONIA, RAPID URINARY ANTIGEN [[id number]] Collected:  08/29/15 1927    Specimen Information:  Urine, Clean Catch Updated:  08/30/15 0513    Narrative:      ORDER#: [id number]                                    ORDERED BY: Loleta Dicker  SOURCE: Urine, Clean Catch                           COLLECTED:  08/29/15 19:27  ANTIBIOTICS AT COLL.:                                RECEIVED :  08/30/15 00:37  S. pneumoniae, Rapid Urinary Antigen       FINAL       08/30/15 05:12  08/30/15   Negative for Streptococcus pneumoniae Urinary Antigen             Note:             This is a presumptive test for the direct qualitative             detection of bacterial antigen. This test is not intended as             a substitute for a gram stain and bacterial culture. Samples             with extremely low levels of antigen may yield negative             results.             Reference Range: Negative      Urine culture [[id number]] Collected:  08/29/15 1032    Specimen Information:  Urine from Urine, Clean Catch Updated:  08/30/15 1222    Narrative:      ORDER#: [id number]                                    ORDERED BY: Cari Caraway  SOURCE: Urine, Clean Catch                           COLLECTED:  08/29/15 10:32  ANTIBIOTICS AT COLL.:                                RECEIVED :  08/29/15 14:21  Culture Urine                              FINAL  08/30/15 12:22  08/30/15   No growth of >1,000 CFU/ML, No further work             Patient Instructions   Discharge Diet:  regular diet  Discharge Activity:  activity as tolerated    Follow Up Appointment:  Follow-up Information     Follow up with Angelina Sheriff, MD In 2 weeks.    Specialties:  Pulmonary Disease, Internal Medicine    Contact information:    [address]  [address] [address]  [phone]          Follow up with Darcel Bayley, MD In 2 weeks.    Specialty:  Infectious Disease    Why:  infectious disease    Contact information:    [address]  [place] [address]  [phone]             Time spent examining patient, discussing with patient/family regarding hospital course, chart review, reconciling medications and discharge planning: 40 minutes.    SignedNorton Pastel, MD  3:23 PM 09/08/2015

## 2015-09-08 NOTE — Discharge Instructions (Signed)
Pneumonia (Adult)  Pneumonia is an infection deep within the lungs. It is in the small air sacs (alveoli). Pneumonia may be caused by a virus or bacteria. Pneumonia caused by bacteriais usually treated with an antibiotic. Severe cases may need to be treated in the hospital. Milder cases can be treated at home. Symptoms usually start to get better during the first2 days of treatment.  Home care  Follow these guidelines when caring for yourself at home:   Rest at home for the first 2 to 3 days, or until you feel stronger. Don't let yourself get overly tired when you go back to your activities.   Stay away from cigarette smoke - yours or other people's.   You may use acetaminophen or ibuprofen to control fever or pain, unless another medicine was prescribed. If you have chronic liver or kidney disease, talk with your health care provider before using these medicines. Also talk with your provider if you've had a stomach ulcer or GI bleeding. Don't give aspirin to anyone younger than 55 years of age who is ill with a fever. It may cause severe liver damage.   Your appetite may be poor, so a light diet is fine.   Drink 6 to 8 glasses of fluids every day to make sure you are getting enough fluids. Beverages can include water, sport drinks, sodas without caffeine, juices, tea, or soup. Fluids will help loosen secretions in the lung. This will make it easier for you to cough up the phlegm (sputum). If you also have heart or kidney disease, check with your health care provider before you drink extra fluids.   Take antibiotic medicine prescribed until it is all gone, even if you are feeling better after a few days.  Follow-up care  Follow up with your health care provider in the next 2 to 3 days, or as advised. This is to be sure the medicine is helping you get better.  If you are 65 or older, you should get a pneumococcal vaccine and a yearlyflu (influenza)shot. You should also get these vaccines if you have  chronic lung disease like asthma, emphysema, or COPD. Ask your provider about this.  When to seek medical advice  Call your health care provider right away if any of these occur:   You don't get better within the first 48 hours of treatment   Shortness of breath gets worse   Rapid breathing (more than 25 breaths per minute)   Coughing up blood   Chest pain gets worse with breathing   Fever of102F (38C) or higher that doesn't get better with fever medicine   Weakness, dizziness, or fainting that gets worse   Thirst or dry mouth that gets worse   Sinus pain, headache, or a stiff neck   Chest pain not caused by coughing  Date Last Reviewed: 04/10/2013   2000-2016 [employer] [address], [place] [address]. All rights reserved. This information is not intended as a substitute for professional medical care. Always follow your healthcare professional's instructions.

## 2015-09-08 NOTE — Progress Notes (Signed)
Pt given all d/c instructions. Pt states understanding teachings and medications. Pt left with all belongings. IV out and tele box returned.

## 2015-09-08 NOTE — Progress Notes (Signed)
PULSE OXIMETRY TESTING:   Document patient's oxygen at rest on room air:   __93___% on room air, at rest (No ranges please)   ___96__% on oxygen, at rest at___2__LPM via NC   IF 88% OR BELOW on room air, STOP HERE,   IF NOT ambulate patient on room air with exertion and document below:   ___90__% on room air, with exertion (must be 88% or below)   ___94__% on oxygen, with exertion, at___1__LPM via NC   All three tests must be during the same session.   Please document in a PROGRESS NOTE.   Testing to qualify for home oxygen must be no earlier than 48 hours prior to discharge, or it will need to be repeated.   Please call the home health care liaison at [phone] or case manager when completed.

## 2015-09-08 NOTE — Plan of Care (Signed)
Problem: Potential for Compromised Hemodynamic Status  Goal: Stable vital signs and fluid balance  Outcome: Progressing  Intervention: Plan activities to conserve energy: plan rest periods.  Pt's breathing improved since past 24 hours. Pt is on room air, sat's 92-94%. No DOE, SOB or CP. Pt states she feels much better and ready to go home. Hourly rounding completed with MD. Awaiting ID's input to d/c.

## 2015-09-08 NOTE — Plan of Care (Signed)
Problem: Safety  Goal: Patient will be free from injury during hospitalization  Outcome: Progressing  Patient ambulating independently on room air around floor this shift. No complaints, VSS on room air. Patient resting in bed, bed locked in lowest position, side rails up x3. Call light within reach, encouraged to express needs, hourly rounding in progress. Will continue to monitor patient.

## 2015-09-08 NOTE — Discharge Instr - Activity (Signed)
As tolerated

## 2015-09-08 NOTE — Progress Notes (Signed)
Active Hospital Problems    Diagnosis   . Pneumonia of right upper lobe due to infectious organism   . Pneumonia   PROGRESS NOTE    Date Time: 09/08/2015 7:15 AM  Patient Name: ARYANI, DAFFERN ANN      Assessment/Plan   1. Pneumonia: Pt with multilobar pneumonia with influenza B and probable bacterial superinfection.  She continues to improve and is stable on  2 liter NC, but desats with exertion.  Follow up CXR had slight improvement and will take weeks to resolve.   She has finished abx  2. Hypoxemia: Stable sats   91% on room air and is able to ambulate of oxygen.  3. Cough: continue with as needed cough meds.   Pt stable for [place] home with follow up in my office in two weeks.      Subjective:   The patient is a 55 year old female without significant past medical  history who developed cough and fevers 2 weeks ago. The patient initially  presented with a cough and fevers.  She feels better today with less cough, shortness of breath and improved appetite.  No further low grade fevers last night.  She denies chest pain or wheeze, but has  cough without sputum.  She has been ambulating in the hallways without oxygen.      Medications:     Current Facility-Administered Medications   Medication Dose Route Frequency   . benzonatate  100 mg Oral TID   . dextromethorphan polistirex ER  30 mg Oral Q12H SCH   . enoxaparin  40 mg Subcutaneous Daily   . pantoprazole  40 mg Oral Daily       Review of Systems:   A comprehensive review of systems was: Negative except cough.    Physical Exam:     Filed Vitals:    09/08/15 0440   BP: 116/60   Pulse: 90   Temp: 98.4 F (36.9 C)   Resp: 18   SpO2: 91%       Intake and Output Summary (Last 24 hours) at Date Time  No intake or output data in the 24 hours ending 09/08/15 0715    General appearance - acyanotic, in no respiratory distress  Mental status - alert, oriented to person, place, and time  Neck - supple, no significant adenopathy  Lymphatics - no palpable lymphadenopathy, no  hepatosplenomegaly  Chest - diminished breath sounds at bases    Heart - normal rate, regular rhythm, normal S1, S2, no murmurs, rubs, clicks or gallops  Abdomen - soft, nontender, nondistended, no masses or organomegaly  Neurological - alert, oriented, normal speech, no focal findings or movement disorder noted  Extremities - peripheral pulses normal, no pedal edema, no clubbing or cyanosis  Skin - normal coloration and turgor, no rashes, no suspicious skin lesions noted       Labs:     Results     ** No results found for the last 24 hours. **          Recent CBC   No results for input(s): RBC, HGB, HCT, MCV, MCH, MCHC, RDW, MPV, LABPLAT in the last 24 hours.    Invalid input(s): WHITEBLOODCE,  NRBCA,  REFLX,  ANRBA    Rads:   Radiological Procedure reviewed.    Signed by: Angelina Sheriff    [phone]

## 2015-10-04 ENCOUNTER — Other Ambulatory Visit: Payer: Self-pay | Admitting: Pulmonary Disease

## 2015-11-07 ENCOUNTER — Other Ambulatory Visit: Payer: Self-pay

## 2015-11-07 DIAGNOSIS — M7918 Myalgia, other site: Secondary | ICD-10-CM

## 2015-11-08 ENCOUNTER — Ambulatory Visit: Payer: No Typology Code available for payment source

## 2015-11-08 DIAGNOSIS — M5126 Other intervertebral disc displacement, lumbar region: Secondary | ICD-10-CM | POA: Insufficient documentation

## 2015-11-08 DIAGNOSIS — M7918 Myalgia, other site: Secondary | ICD-10-CM

## 2015-11-08 DIAGNOSIS — M9973 Connective tissue and disc stenosis of intervertebral foramina of lumbar region: Secondary | ICD-10-CM | POA: Insufficient documentation

## 2015-11-08 DIAGNOSIS — M47816 Spondylosis without myelopathy or radiculopathy, lumbar region: Secondary | ICD-10-CM | POA: Insufficient documentation

## 2015-11-08 DIAGNOSIS — M4806 Spinal stenosis, lumbar region: Secondary | ICD-10-CM | POA: Insufficient documentation

## 2015-11-08 DIAGNOSIS — M545 Low back pain: Secondary | ICD-10-CM | POA: Insufficient documentation

## 2015-11-08 DIAGNOSIS — Z981 Arthrodesis status: Secondary | ICD-10-CM | POA: Insufficient documentation

## 2015-11-08 DIAGNOSIS — M25551 Pain in right hip: Secondary | ICD-10-CM | POA: Insufficient documentation

## 2016-01-27 ENCOUNTER — Ambulatory Visit (INDEPENDENT_AMBULATORY_CARE_PROVIDER_SITE_OTHER): Payer: No Typology Code available for payment source | Admitting: Neurology

## 2016-01-27 ENCOUNTER — Encounter (INDEPENDENT_AMBULATORY_CARE_PROVIDER_SITE_OTHER): Payer: Self-pay

## 2016-01-27 ENCOUNTER — Encounter (INDEPENDENT_AMBULATORY_CARE_PROVIDER_SITE_OTHER): Payer: Self-pay | Admitting: Neurology

## 2016-01-27 VITALS — BP 130/87 | HR 91 | Resp 14 | Ht 67.0 in | Wt 136.0 lb

## 2016-01-27 DIAGNOSIS — G43109 Migraine with aura, not intractable, without status migrainosus: Secondary | ICD-10-CM

## 2016-01-27 DIAGNOSIS — G4701 Insomnia due to medical condition: Secondary | ICD-10-CM

## 2016-01-27 DIAGNOSIS — M5416 Radiculopathy, lumbar region: Secondary | ICD-10-CM

## 2016-01-27 DIAGNOSIS — M5441 Lumbago with sciatica, right side: Secondary | ICD-10-CM

## 2016-01-27 MED ORDER — BUTALBITAL-APAP-CAFFEINE 50-325-40 MG PO TABS
1.0000 | ORAL_TABLET | Freq: Four times a day (QID) | ORAL | 2 refills | Status: AC | PRN
Start: 2016-01-27 — End: ?

## 2016-01-27 MED ORDER — CAMBIA 50 MG PO PACK
50.0000 mg | PACK | Freq: Two times a day (BID) | ORAL | 5 refills | Status: DC | PRN
Start: 2016-01-27 — End: 2016-07-16

## 2016-01-27 MED ORDER — SUMATRIPTAN SUCCINATE 100 MG PO TABS
100.0000 mg | ORAL_TABLET | ORAL | 5 refills | Status: AC | PRN
Start: 2016-01-27 — End: ?

## 2016-01-27 NOTE — Progress Notes (Signed)
Is the patient taking current medications?Yes  Has there been any changes to current medication list?no change    Document any barriers/adverse reactions to current medicationsnone  CURRENT BARRIERS TO TAKING MEDICATION:no    Has the patient sought any care outside of the [facility]?Yes Patient has been instructed to bring/send records of incident at outside facility (HAVE PATIENT FILL OUT RECORD RELEASE FORM)

## 2016-02-02 NOTE — Progress Notes (Signed)
Subjective:      Patient ID: [name] is a 55 y.o. female.    HPI   The patient reports that she was admitted to the hospital with pneumonia on Aug 28, 2015.  She developed severe headaches, which did not improve with Imitrex.  She was then treated with Fioricet, with improvement.  She reports that she also had 2 severe migraine headaches in July.  She took Imitrex, which worked with him 45 minutes.  She also takes Cambia 50 mg as needed.  She has been having lower back pain, which radiates to the right leg.  She has been treated with tizanidine 2 mg needed.  She also started treatment with gabapentin, which has been titrated to 300 mg twice a day by Dr. Luiz Ochoa.  She underwent a second lumbar spine surgery with fusion from L4-S1 on May 14, 2014.  She did not have complications from surgery.  The following portions of the patient's history were reviewed and updated as appropriate: allergies, current medications, past medical history, past social history and problem list.    Review of Systems   Constitutional: Negative for fever.   Respiratory: Negative for shortness of breath.    Cardiovascular: Negative for chest pain.   Gastrointestinal: Negative for diarrhea.   Musculoskeletal: Positive for back pain.   Skin: Negative for rash.   Neurological: Positive for headaches.   Psychiatric/Behavioral: Negative for sleep disturbance.     Current Outpatient Prescriptions on File Prior to Visit   Medication Sig Dispense Refill   . calcipotriene-betamethasone (TACLONEX) ointment Apply topically as needed.        Marland Kitchen CALCIUM PO Take 1 tablet by mouth daily. CHEWABLE     . desonide (DESOWEN) 0.05 % cream Apply topically.        Marland Kitchen dexlansoprazole 60 MG capsule Take 60 mg by mouth every morning.      . Multiple Vitamins-Minerals (MULTIVITAMIN PO) Take 1 tablet by mouth daily.       . SUMAtriptan Succinate (IMITREX IJ) Inject 0.5 mg as directed as needed.        No current facility-administered medications on file  prior to visit.        Objective:     Vitals:    01/27/16 1039   BP: 130/87   Pulse: 91   Resp: 14   Weight: 61.7 kg (136 lb)   Height: 1.702 m (5\' 7" )     The patient is fully awake and alert. She is well related, interactive, and follows commands appropriately. Speech is clear. Cranial nerves II through XII are grossly intact. There is no focal muscle atrophy. Muscle strength is normal in proximal and distal muscles of the arms and legs bilaterally.  Deep tendon reflexes are symmetrically present in the arms, knees, and ankles.  No tremors are noted in the hands. There is no asterixis. Coordination is intact on finger-to-nose to finger testing. Station and gait are normal.    Data Review:  A nerve conduction and EMG study from January 28, 2014 revealed a sensory peripheral neuropathy, which was stable compared to March 2013.  There was no evidence of ongoing lumbar radiculopathy.    MRI of the lumbar spine from January 27, 2014 revealed degenerative disc changes at multiple levels, with improvement after laminectomy at L5/S1 on the right side.    Assessment:     1. Migraine with aura and without status migrainosus, not intractable    2. Lumbar radiculopathy    3. Chronic  midline low back pain with right-sided sciatica    4. Insomnia due to medical condition        Plan:     I advised the patient to continue treatment with Imitrex 100 mg and Cambia 50 mg for abortive migraine therapy, which have been very helpful.  I gave her new prescriptions.  I do not think that she needs to start a new headache prophylactic therapy at this point.  She will maintain a headache diary.  She is aware of the potential triggering factors.    Her lower back pain has been better control with gabapentin.  I advised her to continue taking gabapentin 300 mg twice a day.  I also advised her to perform the home exercises regularly so that she can maintain her posture and strengthen the lumbar paraspinal muscles.  She will continue pain  management with Dr. Luiz Ochoa.  She can take tizanidine 2 mg as needed.    I will see the patient in follow up in 5 months or earlier if needed.  Sincerely,    Marinda Elk M.D    [facility] / Neurology  Board Certified, American Board of Psychiatry and Neurology

## 2016-06-21 ENCOUNTER — Other Ambulatory Visit: Payer: Self-pay | Admitting: Pulmonary Disease

## 2016-06-21 DIAGNOSIS — R911 Solitary pulmonary nodule: Secondary | ICD-10-CM

## 2016-06-24 ENCOUNTER — Ambulatory Visit: Payer: No Typology Code available for payment source | Attending: Pulmonary Disease

## 2016-06-24 DIAGNOSIS — E041 Nontoxic single thyroid nodule: Secondary | ICD-10-CM | POA: Insufficient documentation

## 2016-06-24 DIAGNOSIS — R911 Solitary pulmonary nodule: Secondary | ICD-10-CM | POA: Insufficient documentation

## 2016-06-29 ENCOUNTER — Encounter (INDEPENDENT_AMBULATORY_CARE_PROVIDER_SITE_OTHER): Payer: Self-pay

## 2016-07-16 ENCOUNTER — Encounter (INDEPENDENT_AMBULATORY_CARE_PROVIDER_SITE_OTHER): Payer: Self-pay | Admitting: Neurology

## 2016-07-16 ENCOUNTER — Ambulatory Visit (INDEPENDENT_AMBULATORY_CARE_PROVIDER_SITE_OTHER): Payer: No Typology Code available for payment source | Admitting: Neurology

## 2016-07-16 VITALS — BP 126/83 | HR 73 | Resp 14

## 2016-07-16 DIAGNOSIS — G4701 Insomnia due to medical condition: Secondary | ICD-10-CM

## 2016-07-16 DIAGNOSIS — M5441 Lumbago with sciatica, right side: Secondary | ICD-10-CM

## 2016-07-16 DIAGNOSIS — G43109 Migraine with aura, not intractable, without status migrainosus: Secondary | ICD-10-CM

## 2016-07-16 DIAGNOSIS — M5416 Radiculopathy, lumbar region: Secondary | ICD-10-CM

## 2016-07-16 DIAGNOSIS — G8929 Other chronic pain: Secondary | ICD-10-CM

## 2016-07-16 MED ORDER — CAMBIA 50 MG PO PACK
50.0000 mg | PACK | Freq: Two times a day (BID) | ORAL | 5 refills | Status: AC | PRN
Start: 2016-07-16 — End: ?

## 2016-07-16 NOTE — Progress Notes (Signed)
Is the patient taking current medications?Yes  Has there been any changes to current medication list?no change    Document any barriers/adverse reactions to current medicationsnone  CURRENT BARRIERS TO TAKING MEDICATION:no    Has the patient sought any care outside of the [facility]?No

## 2016-07-17 NOTE — Progress Notes (Signed)
Subjective:      Patient ID: [name] is a 56 y.o. female.    Migraine    Associated symptoms include back pain. Pertinent negatives include no fever.      The patient reports that she has had one migraine since her last visit in January, likely exacerbated by stress. She took Imitrex, which worked within 45 minutes.  She also takes Cambia 50 mg as needed.  She has been having lower back pain, which radiates to the right leg.  She has been treated with epidural injections by Dr. Luiz Ochoa, with improvement. She also takes tizanidine 2 mg needed, gabapentin 300 mg at bedtime, and vitamin D 3000 units per day. She also takes Zantac twice a day.  The following portions of the patient's history were reviewed and updated as appropriate: allergies, current medications, past medical history, past social history and problem list.    Review of Systems   Constitutional: Negative for fever.   Respiratory: Negative for shortness of breath.    Cardiovascular: Negative for chest pain.   Gastrointestinal: Negative for diarrhea.   Musculoskeletal: Positive for back pain.   Skin: Negative for rash.   Neurological: Positive for headaches.   Psychiatric/Behavioral: Negative for sleep disturbance.     Current Outpatient Prescriptions on File Prior to Visit   Medication Sig Dispense Refill   . butalbital-acetaminophen-caffeine (FIORICET, ESGIC) 50-325-40 MG per tablet Take 1 tablet by mouth every 6 (six) hours as needed for Headaches. 30 tablet 2   . calcipotriene-betamethasone (TACLONEX) ointment Apply topically as needed.        Marland Kitchen CALCIUM PO Take 1 tablet by mouth daily. CHEWABLE     . desonide (DESOWEN) 0.05 % cream Apply topically.        Marland Kitchen dexlansoprazole 60 MG capsule Take 60 mg by mouth every morning.      . Fluocinolone Acetonide (CAPEX) 0.01 % Shampoo      . gabapentin (NEURONTIN) 300 MG capsule TAKE ONE CAPSULE EACH NIGHT AT 7 P.M. FOR 1 WEEK, THEN INCREASE TO TWO CAPSULES AT 7 P.M. THEREAFTER PER EFFECT AND SIDE  EFFECT  2   . Multiple Vitamins-Minerals (MULTIVITAMIN PO) Take 1 tablet by mouth daily.       . SUMAtriptan (IMITREX) 100 MG tablet Take 1 tablet (100 mg total) by mouth as needed for Migraine. 12 tablet 5   . SUMAtriptan Succinate (IMITREX IJ) Inject 0.5 mg as directed as needed.      Marland Kitchen tiZANidine (ZANAFLEX) 2 MG tablet TAKE 2-3 TABLETS EACH NIGHT FOR MUSCLE SPASM  2     No current facility-administered medications on file prior to visit.        Objective:     Vitals:    07/16/16 0955   BP: 126/83   Pulse: 73   Resp: 14     The patient is fully awake and alert. She is well related, interactive, and follows commands appropriately. Speech is clear. Cranial nerves II through XII are grossly intact. There is no focal muscle atrophy. Muscle strength is normal in proximal and distal muscles of the arms and legs bilaterally.  Deep tendon reflexes are symmetrically present in the arms, knees, and ankles.  No tremors are noted in the hands. There is no asterixis. Coordination is intact on finger-to-nose to finger testing. Station and gait are normal.    Data Review:  A nerve conduction and EMG study from January 28, 2014 revealed a sensory peripheral neuropathy, which was stable compared to  March 2013.  There was no evidence of ongoing lumbar radiculopathy.    MRI Lumbar Spine WO Contrast [[id number]] (Order [id number])   Status: Final result   Study Result                          [facility]                                 MRI CENTER    JANELYS, GLASSNER PERFORMED AT:  [id number]       DOB:1960-06-05                 [address]  11/08/2015     AGE:35   Gender:F              Physicians Only: [phone]           Mina Marble DO                                    [H]       [address]       [place], [place] [address]      MRI LUMBAR SPINE WITHOUT CONTRAST    HISTORY: 56 years old,lumbar fusion, pain of the right lower extremity              TECHNIQUE: Routine  MRI lumbar spine without  contrast.   COMPARISON: 01/27/14 MRI lumbar spine.  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    At L4-L5-S1, patient status post posterior asymmetric fusion with  decompressive laminectomy, without evidence of abnormal fluid collection.  There is approximately 3 mm degenerative anterolisthesis of L4 on L5,  without appreciable change. Degree of central canal stenosis at L4-L5  appear markedly improved, without evidence of recurrent stenosis at the  level of the surgery.    There is normal vertebral body height without evidence of acute fracture.    There is no evidence of acute inflammatory facet disease.  The marrow signal intensity of the bones are normal.  There is no evidence of pars defect.           Segmental analysis as below:     At L1-L2:                  At L2-L3: Mild broad annular bulge with annular fissuring. Mild disc  degeneration. 2 mm degenerative retrolisthesis evident. Mild central canal  stenosis evident from disc osteophyte and thickened ligamentum flavum.      At L3-L4: Mild broad annular bulge evident. Mild disc degeneration. Mild  central canal stenosis evident from disc osteophyte and thickened  ligamentum flavum. Bilateral foraminal stenosis evident from osteophyte and  facet disease.  At L4-L5:Postoperative changes as noted above.                 At L5-S1:Postoperative changes as noted above.                  IMPRESSION:       1.  L4-L5-S1 interval posterior instrument fusion with improvement in  the central canal stenosis.   2.  L3-L4 mild central canal and bilateral foraminal stenosis from  spondylosis and broad annular bulge, developed.  3.  L2-L3 mild central canal and bilateral foraminal stenosis from  spondylosis and broad annular bulge, developed.    Electronically signed by: Laneta Simmers M.D.  [facility]       Assessment:     1. Migraine with aura and without status migrainosus, not intractable    2. Lumbar radiculopathy    3. Chronic midline low back pain with right-sided sciatica    4. Insomnia due to medical condition        Plan:     I advised the patient to continue treatment with Imitrex 100 mg and Cambia 50 mg for abortive migraine therapy, which have been very helpful.  I gave her a new prescription for Cambia 50 mg.   I do not think that she needs to start a new headache prophylactic therapy at this point.  She will maintain a headache diary.  She is aware of the potential triggering factors.    Her lower back pain has been better control with gabapentin.  I advised her to continue taking gabapentin 300 mg at bedtime and tizanidine 2 mg as needed.   I also advised her to perform the home exercises regularly so that she can maintain her posture and strengthen the lumbar paraspinal muscles.  She will continue pain management with Dr. Luiz Ochoa.      She will continue to take vitamin D 3000 units per day, in combination with the calcium supplements.    I will see the patient in follow up in 5 months or earlier if needed.  Sincerely,    Marinda Elk M.D    [facility] / Neurology  Board Certified, American Board of Psychiatry and Neurology

## 2016-12-16 ENCOUNTER — Ambulatory Visit (INDEPENDENT_AMBULATORY_CARE_PROVIDER_SITE_OTHER): Payer: No Typology Code available for payment source | Admitting: Neurology

## 2016-12-28 ENCOUNTER — Other Ambulatory Visit: Payer: Self-pay | Admitting: Physical Medicine & Rehabilitation

## 2016-12-28 ENCOUNTER — Ambulatory Visit: Payer: No Typology Code available for payment source | Attending: Physical Medicine & Rehabilitation

## 2016-12-28 DIAGNOSIS — M25852 Other specified joint disorders, left hip: Secondary | ICD-10-CM | POA: Insufficient documentation

## 2016-12-28 DIAGNOSIS — M791 Myalgia: Secondary | ICD-10-CM | POA: Insufficient documentation

## 2016-12-28 DIAGNOSIS — M5116 Intervertebral disc disorders with radiculopathy, lumbar region: Secondary | ICD-10-CM | POA: Insufficient documentation

## 2016-12-28 DIAGNOSIS — M25552 Pain in left hip: Secondary | ICD-10-CM | POA: Insufficient documentation

## 2016-12-28 DIAGNOSIS — M24152 Other articular cartilage disorders, left hip: Secondary | ICD-10-CM | POA: Insufficient documentation

## 2016-12-28 DIAGNOSIS — M24852 Other specific joint derangements of left hip, not elsewhere classified: Secondary | ICD-10-CM | POA: Insufficient documentation

## 2016-12-28 DIAGNOSIS — M4326 Fusion of spine, lumbar region: Secondary | ICD-10-CM | POA: Insufficient documentation

## 2017-01-07 ENCOUNTER — Other Ambulatory Visit: Payer: Self-pay | Admitting: Physical Medicine & Rehabilitation

## 2017-01-07 DIAGNOSIS — M545 Low back pain: Secondary | ICD-10-CM

## 2017-01-10 ENCOUNTER — Other Ambulatory Visit: Payer: Self-pay | Admitting: Physical Medicine & Rehabilitation

## 2017-01-10 ENCOUNTER — Ambulatory Visit: Payer: No Typology Code available for payment source | Attending: Physical Medicine & Rehabilitation

## 2017-01-10 DIAGNOSIS — M4316 Spondylolisthesis, lumbar region: Secondary | ICD-10-CM | POA: Insufficient documentation

## 2017-01-10 DIAGNOSIS — M4807 Spinal stenosis, lumbosacral region: Secondary | ICD-10-CM | POA: Insufficient documentation

## 2017-01-10 DIAGNOSIS — M5116 Intervertebral disc disorders with radiculopathy, lumbar region: Secondary | ICD-10-CM

## 2017-01-10 DIAGNOSIS — M4326 Fusion of spine, lumbar region: Secondary | ICD-10-CM | POA: Insufficient documentation

## 2017-01-10 DIAGNOSIS — M5126 Other intervertebral disc displacement, lumbar region: Secondary | ICD-10-CM | POA: Insufficient documentation

## 2017-01-10 DIAGNOSIS — M791 Myalgia, unspecified site: Secondary | ICD-10-CM

## 2017-01-10 DIAGNOSIS — M545 Low back pain: Secondary | ICD-10-CM

## 2017-01-10 MED ORDER — GADOBUTROL 1 MMOL/ML IV SOLN
6.5000 mL | Freq: Once | INTRAVENOUS | Status: AC | PRN
Start: 2017-01-10 — End: 2017-01-10
  Administered 2017-01-10: 6.5 mmol via INTRAVENOUS
  Filled 2017-01-10: qty 7.5

## 2017-01-11 ENCOUNTER — Encounter (INDEPENDENT_AMBULATORY_CARE_PROVIDER_SITE_OTHER): Payer: Self-pay | Admitting: Neurology

## 2017-01-11 ENCOUNTER — Ambulatory Visit (INDEPENDENT_AMBULATORY_CARE_PROVIDER_SITE_OTHER): Payer: No Typology Code available for payment source | Admitting: Neurology

## 2017-01-11 VITALS — BP 120/77 | HR 80 | Resp 13

## 2017-01-11 DIAGNOSIS — M5441 Lumbago with sciatica, right side: Secondary | ICD-10-CM

## 2017-01-11 DIAGNOSIS — G8929 Other chronic pain: Secondary | ICD-10-CM

## 2017-01-11 DIAGNOSIS — M5416 Radiculopathy, lumbar region: Secondary | ICD-10-CM

## 2017-01-11 DIAGNOSIS — G4701 Insomnia due to medical condition: Secondary | ICD-10-CM

## 2017-01-11 DIAGNOSIS — G43109 Migraine with aura, not intractable, without status migrainosus: Secondary | ICD-10-CM

## 2017-01-11 NOTE — Progress Notes (Signed)
Is the patient taking current medications?Yes  Has there been any changes to current medication list?no change    Document any barriers/adverse reactions to current medicationsnone  CURRENT BARRIERS TO TAKING MEDICATION:no    Has the patient sought any care outside of the [facility]?No

## 2017-01-11 NOTE — Progress Notes (Signed)
Subjective:      Patient ID: [name] is a 56 y.o. female.    Migraine    Associated symptoms include back pain. Pertinent negatives include no fever.      The patient presents with exacerbation of lower back pain.  She reports no associated injuries.  She has been taking gabapentin 600 mg at bedtime, which also helps her to sleep at least 6 hours.  The back pain radiates to the right leg.  She has been treated with epidural injections.  She also takes tizanidine as needed.  She has been having migraines every 3 months, which typically are exacerbated by stress. She took Imitrex, which worked within 45 minutes.  She also takes Cambia 50 mg as needed.  The following portions of the patient's history were reviewed and updated as appropriate: allergies, current medications, past medical history, past social history and problem list.    Review of Systems   Constitutional: Negative for fever.   Respiratory: Negative for shortness of breath.    Cardiovascular: Negative for chest pain.   Gastrointestinal: Negative for diarrhea.   Musculoskeletal: Positive for back pain.   Skin: Negative for rash.   Neurological: Positive for headaches.   Psychiatric/Behavioral: Negative for sleep disturbance.     Current Outpatient Prescriptions on File Prior to Visit   Medication Sig Dispense Refill   . butalbital-acetaminophen-caffeine (FIORICET, ESGIC) 50-325-40 MG per tablet Take 1 tablet by mouth every 6 (six) hours as needed for Headaches. 30 tablet 2   . calcipotriene-betamethasone (TACLONEX) ointment Apply topically as needed.        Marland Kitchen CAMBIA 50 MG Pack Take 50 mg by mouth 2 (two) times daily as needed (migraines). 18 each 5   . desonide (DESOWEN) 0.05 % cream Apply topically.        Marland Kitchen dexlansoprazole 60 MG capsule Take 60 mg by mouth every morning.      . Fluocinolone Acetonide (CAPEX) 0.01 % Shampoo      . gabapentin (NEURONTIN) 300 MG capsule TAKE ONE CAPSULE EACH NIGHT AT 7 P.M. FOR 1 WEEK, THEN INCREASE TO TWO  CAPSULES AT 7 P.M. THEREAFTER PER EFFECT AND SIDE EFFECT  2   . Multiple Vitamins-Minerals (MULTIVITAMIN PO) Take 1 tablet by mouth daily.       . SUMAtriptan (IMITREX) 100 MG tablet Take 1 tablet (100 mg total) by mouth as needed for Migraine. 12 tablet 5   . SUMAtriptan Succinate (IMITREX IJ) Inject 0.5 mg as directed as needed.      Marland Kitchen tiZANidine (ZANAFLEX) 2 MG tablet TAKE 2-3 TABLETS EACH NIGHT FOR MUSCLE SPASM  2   . [DISCONTINUED] CALCIUM PO Take 1 tablet by mouth daily. CHEWABLE       No current facility-administered medications on file prior to visit.        Objective:     Vitals:    01/11/17 1606   BP: 120/77   Pulse: 80   Resp: 13     The patient is fully awake and alert. She is well related, interactive, and follows commands appropriately.  There is no tenderness to percussion over the lumbar spine.  Speech is clear. Cranial nerves II through XII are grossly intact. There is no focal muscle atrophy. Muscle strength is normal in proximal and distal muscles of the arms and legs bilaterally.  Deep tendon reflexes are symmetrically present in the arms, knees, and ankles.  No tremors are noted in the hands. There is no asterixis. Coordination is  intact on finger-to-nose to finger testing. Station and gait are normal.  She is able to walk on her toes, heels, and tandem.    Data Review:    Images of the MRI of the lumbar spine from January 10, 2017 were personally reviewed with the patient:    MRI Lumbar Spine W WO Contrast [[id number]] (Order [id number])   Status: Final result   Study Result                          [facility]                                 MRI CENTER    YNEZ, EUGENIO PERFORMED AT:  [id number]       DOB:[birth year]                 [address]  01/10/2017     AGE:46   Gender:F              Physicians Only: [phone]             Marissa Nestle MD                              [H]       [address]  [address]       [place], [place] [address]      MRI LUMBAR SPINE WITHOUT AND WITH CONTRAST    HISTORY: Low back pain. Previous instrumented lumbar fusion in 2016.    COMPARISON: Multiple prior MRI scans of the lumbar spine are available the  most recent which is dated November 08, 2015. The oldest available prior study  is dated August 02, 2010    TECHNIQUE:  The study was performed on a 3.0T MRI system.  Non-contrast MRI  imaging of the lumbar spine was obtained.     FINDINGS: For the purposes of this report, the study is labeled as if there  are five lumbar segments. Prior to any operative intervention, careful  confirmation of the level utilizing conventional radiographs and this study  is recommended.    The conus medullaris terminates at the L1 level. This is normal.  The filum  terminale is normal in appearance.     Stable postsurgical changes from a bilateral instrumented posterior fusion  at L4-L5 and L5-S1 are again seen.     Also again seen at the L3-L4 level is a stable small ridge of disc  protrusion which in conjunction with overgrowth of the facet joint and  ligament flavum produces a stable mild central spinal canal stenosis. The  nerve roots the cauda equina are crowded together. The transverse diameter  of the spinal canal is narrowed to 8 mm. The AP diameter of the spinal  canal is partially narrowed but still measures 10 mm.     A stable mild disc protrusion at L2-L3 is again seen. No nerve root  impingement is seen at this level.    At the L5-S1 level (which is the lower level of the fusion) there is a  stable partial narrowing of the right L5-S1 neural foramen.  There may  be  some mild impingement or irritation of the exiting portion of the right L5  nerve root.    There has been no change since the most recent prior MRI of November 08, 2015.    IMPRESSION:     1. Stable postsurgical changes from a bilateral instrumented posterior  fusion at L4-L5 and L5-S1 are again seen.     2. Also again seen at  the L3-L4 level is a stable small ridge of disc  protrusion which in conjunction with overgrowth of the facet joint and  ligament flavum produces a stable mild central spinal canal stenosis. The  nerve roots the cauda equina are crowded together. The transverse diameter  of the spinal canal is narrowed to 8 mm. The AP diameter of the spinal  canal is partially narrowed but still measures 10 mm.     3. A stable mild disc protrusion at L2-L3 is again seen. No nerve root  impingement is seen at this level.    4. At the L5-S1 level (which is the lower level of the fusion) there is a  stable partial narrowing of the right L5-S1 neural foramen.  There may be  some mild impingement or irritation of the exiting portion of the right L5  nerve root.    5. There has been no change since the most recent prior MRI of November 08, 2015.    Electronically signed by: Theodoro Doing M.D.  [facility]    PO: 01/11/17       A nerve conduction and EMG study from January 28, 2014 revealed a sensory peripheral neuropathy, which was stable compared to March 2013.  There was no evidence of ongoing lumbar radiculopathy.    Assessment:     1. Migraine with aura and without status migrainosus, not intractable    2. Chronic midline low back pain with right-sided sciatica    3. Lumbar radiculopathy    4. Insomnia due to medical condition        Plan:     I had a long conversation with the patient.  I reassured her that she does not have obvious focal neurological deficits.  However, she has had exacerbation of severe lower back pain despite treatment with epidural injections.  She is going to try Horizant to improve the duration of benefit from gabapentin.  She reports that gabapentin typically works for 4-5 hours at night.    I am going to repeat the nerve conduction and EMG study in the lower extremities to evaluate for lumbar radiculopathy.  The previous EMG from 2015 did not reveal evidence of ongoing lumbar  radiculopathy.  She will continue pain management with Dr. Luiz Ochoa.      I advised the patient to continue treatment with Imitrex 100 mg and Cambia 50 mg for abortive migraine therapy, which have been very helpful.    I do not think that she needs to start a new headache prophylactic therapy at this point.  She will maintain a headache diary.  She is aware of the potential triggering factors.    She will continue to take vitamin D 3000 units per day, in combination with the calcium supplements.    Sincerely,    Marinda Elk M.D    [facility] / Neurology  Board Certified, American Board of Psychiatry and Neurology

## 2017-02-11 ENCOUNTER — Ambulatory Visit (INDEPENDENT_AMBULATORY_CARE_PROVIDER_SITE_OTHER): Payer: No Typology Code available for payment source | Admitting: Neurology

## 2017-02-11 DIAGNOSIS — G629 Polyneuropathy, unspecified: Secondary | ICD-10-CM

## 2017-02-11 DIAGNOSIS — G8929 Other chronic pain: Secondary | ICD-10-CM

## 2017-02-11 DIAGNOSIS — M5441 Lumbago with sciatica, right side: Secondary | ICD-10-CM

## 2017-02-11 NOTE — Progress Notes (Signed)
[facility]  [address], [address]  [place], [place] [address]  [phone]    Nerve Conduction & EMG Report  ________________________________________________________________________          Patient: [name]  MEDICAL RECORD NUMBER: [id number]   02/11/2017  Sex: Female  Height: 5 feet 7 inch  Weight: 136 lbs  Date of Birth: 1961-02-03  Age: 55 Years 1 Months  Diabetes?: no  Coumadin?: no  Pacemaker: no  Refer Physician: Marissa Nestle, MD    Clinical presentation:    The patient presents with exacerbation of persistent lower back pain, which radiates over the posterior aspect of the right leg, down to the back of the knee.  She underwent lumbar laminectomy in November 2014.  She has been treated with epidural steroid injections, with improvement but her pain tends to get worse again.  She also has been treated with tizanidine as needed.  She reports no muscle weakness or difficulty climbing stairs.  On neurological examination, cranial nerves II through XII are grossly intact. There is no focal muscle atrophy. Muscle strength is normal in proximal and distal muscles of the arms and legs bilaterally.  Deep tendon reflexes are symmetrically present in the arms, knees, and ankles.  Coordination is intact on finger-to-nose to finger testing. Station and gait are normal.  She is able to walk on her toes, heels, and tandem.    Summary:    The motor conduction test was normal in all 2 of the tested nerves: R COMM PERONEAL - EDB and R TIBIAL (KNEE) - AH.    The sensory conduction test had results outside of the specified normal range in all 3 of the tested nerves:  Marland Kitchen In the R SURAL study  o the response was absent for Calf stimulation  . In the L SURAL study  o the response was absent for Calf stimulation  . In the R SUP PERONEAL study  o the response was absent for Lat leg stimulation    The needle EMG examination was performed in 11 muscles. It was normal in 9 muscle(s): R. TIB  ANTERIOR, R. GASTROCN (MED), R. VAST MEDIALIS, R. EXT HALL LONG, L. TIB ANTERIOR, L. GASTROCN (MED), L. VAST MEDIALIS, L. EXT HALL LONG, and R. LUMB PSP (L).   The study was abnormal in 2 muscle(s), with the following distribution:  . The MUP waveform abnormality was found in R. FLEX DIG LONGUS and L. FLEX DIG LONGUS.  Marland Kitchen Abnormal interference pattern was found in R. FLEX DIG LONGUS and L. FLEX DIG LONGUS.        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  There is no involvement in the motor fibers  There is no evidence of acute lumbar radiculopathy.  There are mild chronic neurogenic changes in the FDL muscles bilaterally which could be related to the chronic neuropathy, or chronic radiculopathy at S1 bilaterally.  The recruitment pattern in the gastrocnemius muscles is normal.  Structural correlation with MRI of the lumbar spine is recommended.    The nerve conduction and EMG study is unchanged compared to the previous study from March 30, 2014.    Thank you very much for your kind referral.  Sincerely,    Marinda Elk, M.D    [facility] / Neurology  Board Certified, American Board of Psychiatry and  Neurology                Sensory NCS      Nerve / Sites Rec. Site Onset Lat Peak Lat Ref. NP Amp Ref. PP Amp Segments Distance Onset Vel Ref. Temp.     ms ms ms V V V  cm m/s m/s C   R SURAL      Calf Lat Mall NR NR ?4.20 NR ?7.9 NR Calf - Lat Mall 14 NR ?42.0 29.3   L SURAL      Calf Lat Mall NR NR ?4.20 NR ?7.9 NR Calf - Lat Mall 14 NR ?42.0 29.8   R SUP PERONEAL      Lat leg Ankle NR NR ?3.30 NR ?13.9 NR Lat leg - Ankle 12 NR  30.6       Motor NCS      Nerve / Sites Rec. Site Lat Ref. Amp Ref. Seq Amp Ref. Segments Dist. Vel Ref. Temp     ms ms mV mV % %  cm m/s m/s C   R COMM PERONEAL - EDB      Ankle EDB 3.85 ?6.60 6.6 ?1.9 100   Ankle - EDB 6.5   29.3      Fib Head EDB 11.30  5.5  82.7 ?70 Fib Head - Ankle 38.5 52 ?41 29.6      Knee EDB 12.71  5.6  102 ?115 Knee - Fib Head 8 57 ?39 29.7   R TIBIAL (KNEE) - AH      Ankle AH 3.65 ?7.00 5.1 ?2.9 100  Ankle - AH 6   29.8      Knee AH 12.86  6.1  121 ?115 Knee - Ankle 45 49 ?40 29.8       EMG Summary Table     Spontaneous MUAP Recruitment Activation    IA Fib PSW Fasc Amp Dur. Morphology Pattern Effort   R. TIB ANTERIOR N None None None N N Normal N Good   R. GASTROCN (MED) N None None None N N Normal N Good   R. VAST MEDIALIS N None None None N N Normal N Good   R. EXT HALL LONG N None None None N N Normal N Good   R. FLEX DIG LONGUS N None None None Increased Long Polyphasic Mildly Reduced Good   L. TIB ANTERIOR N None None None N N Normal N Good   L. GASTROCN (MED) N None None None N N Normal N Good   L. VAST MEDIALIS N None None None N N Normal N Good   L. EXT HALL LONG N None None None N N Normal N Good   L. FLEX DIG LONGUS N None None None Increased Long Polyphasic Mildly Reduced Good   R. LUMB PSP (L) N None None None N N Normal N Good

## 2017-04-11 ENCOUNTER — Other Ambulatory Visit: Payer: Self-pay | Admitting: Orthopaedic Surgery

## 2017-04-11 DIAGNOSIS — M79604 Pain in right leg: Secondary | ICD-10-CM

## 2017-04-11 DIAGNOSIS — M25551 Pain in right hip: Secondary | ICD-10-CM

## 2017-04-14 ENCOUNTER — Other Ambulatory Visit: Payer: Self-pay | Admitting: Orthopaedic Surgery

## 2017-04-14 ENCOUNTER — Ambulatory Visit: Payer: No Typology Code available for payment source | Attending: Orthopaedic Surgery

## 2017-04-14 DIAGNOSIS — M2578 Osteophyte, vertebrae: Secondary | ICD-10-CM | POA: Insufficient documentation

## 2017-04-14 DIAGNOSIS — M533 Sacrococcygeal disorders, not elsewhere classified: Secondary | ICD-10-CM

## 2017-04-14 DIAGNOSIS — M25551 Pain in right hip: Secondary | ICD-10-CM

## 2017-04-14 DIAGNOSIS — M79604 Pain in right leg: Secondary | ICD-10-CM

## 2017-04-14 DIAGNOSIS — M47818 Spondylosis without myelopathy or radiculopathy, sacral and sacrococcygeal region: Secondary | ICD-10-CM | POA: Insufficient documentation

## 2017-04-14 DIAGNOSIS — M545 Low back pain: Secondary | ICD-10-CM | POA: Insufficient documentation

## 2017-05-23 ENCOUNTER — Other Ambulatory Visit: Payer: Self-pay | Admitting: Orthopaedic Surgery

## 2017-05-23 DIAGNOSIS — M5440 Lumbago with sciatica, unspecified side: Secondary | ICD-10-CM

## 2017-05-26 ENCOUNTER — Ambulatory Visit: Payer: No Typology Code available for payment source | Attending: Orthopaedic Surgery

## 2017-05-26 DIAGNOSIS — Z981 Arthrodesis status: Secondary | ICD-10-CM | POA: Insufficient documentation

## 2017-05-26 DIAGNOSIS — M47816 Spondylosis without myelopathy or radiculopathy, lumbar region: Secondary | ICD-10-CM | POA: Insufficient documentation

## 2017-05-26 DIAGNOSIS — M4316 Spondylolisthesis, lumbar region: Secondary | ICD-10-CM | POA: Insufficient documentation

## 2017-05-26 DIAGNOSIS — M5440 Lumbago with sciatica, unspecified side: Secondary | ICD-10-CM

## 2017-05-26 DIAGNOSIS — M545 Low back pain: Secondary | ICD-10-CM | POA: Insufficient documentation

## 2017-07-17 ENCOUNTER — Emergency Department: Payer: No Typology Code available for payment source

## 2017-07-17 ENCOUNTER — Emergency Department
Admission: EM | Admit: 2017-07-17 | Discharge: 2017-07-17 | Disposition: A | Discharge status: Home / Self Care | Payer: No Typology Code available for payment source | Attending: Emergency Medicine | Admitting: Emergency Medicine

## 2017-07-17 DIAGNOSIS — S90121A Contusion of right lesser toe(s) without damage to nail, initial encounter: Secondary | ICD-10-CM | POA: Insufficient documentation

## 2017-07-17 DIAGNOSIS — Z79899 Other long term (current) drug therapy: Secondary | ICD-10-CM | POA: Insufficient documentation

## 2017-07-17 DIAGNOSIS — W010XXA Fall on same level from slipping, tripping and stumbling without subsequent striking against object, initial encounter: Secondary | ICD-10-CM | POA: Insufficient documentation

## 2017-07-17 DIAGNOSIS — Y990 Civilian activity done for income or pay: Secondary | ICD-10-CM | POA: Insufficient documentation

## 2017-07-17 DIAGNOSIS — G43909 Migraine, unspecified, not intractable, without status migrainosus: Secondary | ICD-10-CM | POA: Insufficient documentation

## 2017-07-17 DIAGNOSIS — R600 Localized edema: Secondary | ICD-10-CM

## 2017-07-17 DIAGNOSIS — K219 Gastro-esophageal reflux disease without esophagitis: Secondary | ICD-10-CM | POA: Insufficient documentation

## 2017-07-17 NOTE — ED Provider Notes (Signed)
Physician/Midlevel provider first contact with patient: 07/17/17 1313         History     Chief Complaint   Patient presents with   . Toe Pain     Pt with hx anemia, arthritis, GERD, migraines, scoliosis, low back pain, neuropathy, varicose veins, presents with R 2nd toe ecchymosis since this morning and R ankle/foot swelling since yesterday. Pt notes she went to paint her toenails yesterday and noticed she could barely see her lateral malleolus due to edema, It's improved this morning but still present. Her 2nd R toe has ecchymosis around the middle phalanx. She tripped and fell a few weeks ago at work. She saw her podiatrist a few weeks ago for a steroid injection in her foot for plantar fascitis. She went to [facility] today and sent here for evaluation. She notes some pain in her lateral foot with weight bearing. No pain at rest. No trauma, redness, numbness, tingling, weakness, calf pain. No hx DVT/PE, estrogen use, malignancy.       The history is provided by the patient.            Past Medical History:   Diagnosis Date   . Anemia     with pregnancy   . Arthritis     THUMB   . GERD (gastroesophageal reflux disease)    . Headache(784.0)     MIGRAINES, headache today   . Low back pain     spinal stenosis, right sided pain, no neuro deficits   . Neuropathy     narrowing of opening around nerve in spine (right side)   . PONV (postoperative nausea and vomiting)    . Rash     PSORIASIS   . Scoliosis     slight curvature-noted by orthopedist a many years ago   . Varicose veins     [facility]       Past Surgical History:   Procedure Laterality Date   . ARTHRODESIS  Feb 2013    Left foot   . BACK SURGERY  Nov. 2014    nerve opening   . BREAST SURGERY  1993    right benign hardened cyst   . BUNIONECTOMY  2003   . COLONOSCOPY  02/2011   . ESOPHAGOGASTRODUODENOSCOPY      X3   . LAMINECTOMY, LUMBAR, REMOVAL HNP, LEVEL 1  02/28/2013    Procedure: LAMINECTOMY, LUMBAR, REMOVAL HNP, LEVEL 1;  Surgeon: Marnee Guarneri, MD;  Location: [facility];  Service: Neurosurgery;  Laterality: Right;  RIGHT L5-S1 LUMBAR FORAMINOTOMY W/EXCISION OF SYNOVIAL CYST   . LAMINECTOMY, POSTERIOR LUMBAR, DECOMP, FUSION, LEVEL 2 N/A 05/14/2014    Procedure: LAMINECTOMY, POSTERIOR LUMBAR, DECOMP, FUSION, LEVEL 2;  Surgeon: Tharon Aquas, MD;  Location: [facility];  Service: Orthopedics;  Laterality: N/A;  LAMINECTOMY AND FUSION L4-S1,BMP-2     . SKIN BIOPSY      various times-brown spots removed by dermatologist   . SPINE SURGERY  Nov. 2014    nerve opening   . THYROID SURGERY  age 68    benign mass removed       History reviewed. No pertinent family history.    Social  Social History   Substance Use Topics   . Smoking status: Never Smoker   . Smokeless tobacco: Never Used      Comment: n/a   . Alcohol use 1.8 oz/week     4 Glasses of wine per  week      Comment: occasionally a mixed drink       .     Allergies   Allergen Reactions   . Sulfa Antibiotics Rash and Fever   . Percocet [Oxycodone-Acetaminophen] Other (See Comments)     Confusion   . Tramadol-Acetaminophen Nausea Only and Other (See Comments)   . Tramadol Hcl Er Itching, Nausea And Vomiting, Other (See Comments) and Rash       Home Medications             butalbital-acetaminophen-caffeine (FIORICET, ESGIC) 50-325-40 MG per tablet     Take 1 tablet by mouth every 6 (six) hours as needed for Headaches.     calcipotriene-betamethasone (TACLONEX) ointment     Apply topically as needed.        CAMBIA 50 MG Pack     Take 50 mg by mouth 2 (two) times daily as needed (migraines).     desonide (DESOWEN) 0.05 % cream     Apply topically.        dexlansoprazole 60 MG capsule     Take 60 mg by mouth every morning.      Fluocinolone Acetonide (CAPEX) 0.01 % Shampoo          gabapentin (NEURONTIN) 300 MG capsule     TAKE ONE CAPSULE EACH NIGHT AT 7 P.M. FOR 1 WEEK, THEN INCREASE TO TWO CAPSULES AT 7 P.M. THEREAFTER PER EFFECT AND SIDE EFFECT     HYDROcodone-acetaminophen  (NORCO) 5-325 MG per tablet     Take 1 tablet by mouth every 6 (six) hours as needed.     Multiple Vitamins-Minerals (MULTIVITAMIN PO)     Take 1 tablet by mouth daily.       SUMAtriptan (IMITREX) 100 MG tablet     Take 1 tablet (100 mg total) by mouth as needed for Migraine.     SUMAtriptan Succinate (IMITREX IJ)     Inject 0.5 mg as directed as needed.      tiZANidine (ZANAFLEX) 2 MG tablet     TAKE 2-3 TABLETS EACH NIGHT FOR MUSCLE SPASM           Review of Systems   All other systems reviewed and are negative.      Physical Exam    BP: 155/70, Heart Rate: (!) 106, Temp: 98.6 F (37 C), Resp Rate: 18, SpO2: 96 %, Weight: 68 kg    Physical Exam   Constitutional: She appears well-developed and well-nourished. No distress (NAD).   HENT:   Head: Normocephalic and atraumatic.   Right Ear: External ear normal.   Left Ear: External ear normal.   Nose: Nose normal.   Neck: Normal range of motion. Neck supple.   Pulmonary/Chest: Effort normal.   Musculoskeletal: Normal range of motion.   RLE with very mild edema to lower leg, ankle and foot. Skin calf is soft without erythema, ecchymosis, rashes or induration. Calf nontender. 2+ DP pulse, foot without erythema, induration, increased warmth. Tenderness to dorsal lateral foot along 5th metatarsal. R 2nd toe with ecchymosis to middle phalanx region, tip of toe is warm and pink, cap refill < 2 sec, toe nontender, NL ROM.    Neurological: She is alert.   Skin: Skin is warm and dry.   Psychiatric: She has a normal mood and affect. Her behavior is normal.   Nursing note and vitals reviewed.        MDM and ED Course     ED Medication Orders  None             MDM  Number of Diagnoses or Management Options  Contusion of lesser toe of right foot without damage to nail, initial encounter:   Leg edema:   Diagnosis management comments:   DDX: toe contusion, toe fracture, toe sprain, DVT, arthritis. No signs of infectious etiology or compartment syndrome. Will check venous doppler.  If neg plan to dispo home with out f/u with podiatry and PMD.     Radiologic study results reviewed by ED provider with patient and/or family:  Yes    Re-eval: pt looks well, NAD. Reviewed all imaging results with patient, including abnormal findings. Discussed treatment and need for close outpatient follow up with their PMD and/or referrals provided today to discuss these findings.  All questions and concerns addressed. Strict return precautions given. Pt and/or family fully understand(s) and agrees to tx plan.                     Procedures    Clinical Impression & Disposition     Clinical Impression  Final diagnoses:   Contusion of lesser toe of right foot without damage to nail, initial encounter   Leg edema        ED Disposition     ED Disposition Condition Date/Time Comment    Discharge  Sun Jul 17, 2017  2:22 PM [name] discharge to home/self care.    Condition at disposition: Stable           Discharge Medication List as of 07/17/2017  2:22 PM                    Christene Slates, PA  07/18/17 7829       Isidoro Donning, MD  07/20/17 1800

## 2017-07-17 NOTE — Discharge Instructions (Signed)
Dear [name],    You were seen today by Josph Macho, PA-C. Thank you for choosing the [facility] for your healthcare needs.  We hope your visit today was EXCELLENT.    ELEVATE EXTREMITY MULTIPLE TIMES PER DAY  FOLLOW UP WITH YOUR PRIMARY DOCTOR ANDPODIATRIST IN 1-2 DAYS FOR REPEAT EVALUATION.  RETURN TO THE ER IF YOU DEVELOP WORSENING SYMPTOMS, SEVERE PAIN, REDNESS, SWELLING, NUMBNESS, DISCOLORATION, LOSS OF FUNCTION, OR NEW CONCERNING SYMPTOMS.    Please take any medications prescribed as directed.     If you have any questions or concerns, I am available at [phone]. Please do not hesitate to contact me if I can be of assistance.     Below is some information and resources that our patients often find helpful.    Sincerely,    Josph Macho, Physician Assistant  [facility]    ________________________________________________________________  Thank you for choosing [facility] for your emergency care needs.  We strive to provide EXCELLENT care to you and your family.      If you do not continue to improve, your condition worsens, or you develop new concerning symptoms please contact your doctor or return immediately to the Emergency Department.    DOCTOR REFERRALS  Call [phone] (available 24 hours a day, 7 days a week) if you need any further referrals and we can help you find a primary care doctor or specialist.  Also, available online at:  [email or web address]    YOUR CONTACT INFORMATION  Before leaving please check with registration to make sure we have an up-to-date contact number.  You can call registration at [phone] to update your information.  For questions about your hospital bill, please call [phone].  For questions about your Emergency Dept Physician bill please call [phone].      FREE HEALTH SERVICES  If you need help with health or social services, please call  2-1-1 for a free referral to resources in your area.  2-1-1 is a free service connecting people with information on health insurance, free clinics, pregnancy, mental health, dental care, food assistance, housing, and substance abuse counseling.  Also, available online at:  http://www.[facility].org    MEDICAL RECORDS AND TESTS  Certain laboratory test results do not come back the same day, for example urine cultures.  We will contact you if other important findings are noted. Radiology films are often reviewed again to ensure accuracy.  If there is any discrepancy, we will notify you.    Please call [phone] to pick up a complimentary CD of any radiology studies performed.  If you or your doctor would like to request a copy of your medical records, please call [phone].      ORTHOPEDIC INJURY   Please know that significant injuries can exist even when an initial x-ray is read as normal or negative.  This can occur because some fractures (broken bones) are not initially visible on x-rays or with soft tissue injuries.  For this reason, close outpatient follow-up with your primary care doctor or bone specialist (orthopedist) is required.    MEDICATIONS AND FOLLOWUP  Please be aware that some prescription medications can cause drowsiness.  Use caution when driving or operating machinery.    The examination and treatment you have received in our Emergency Department is provided on an emergency basis, and is not intended to be a substitute for your primary care physician.  It is important that your doctor checks you again and that you report any new or remaining problems at that time.      [place], [place], [place] [address] (1.4 miles, 7 minutes)  [place], [place], [place] [address] (6.5 miles, 13 minutes)  Handout with directions available on request.

## 2018-04-19 HISTORY — PX: SKIN BIOPSY: SHX1

## 2018-09-25 ENCOUNTER — Other Ambulatory Visit: Payer: Self-pay | Admitting: Foot & Ankle Surgery

## 2018-11-15 ENCOUNTER — Other Ambulatory Visit: Payer: Self-pay

## 2018-12-27 ENCOUNTER — Encounter (INDEPENDENT_AMBULATORY_CARE_PROVIDER_SITE_OTHER): Payer: Self-pay

## 2019-01-11 ENCOUNTER — Telehealth: Payer: No Typology Code available for payment source

## 2019-01-11 NOTE — Pre-Procedure Instructions (Signed)
•   Anesthesia Guidelines:  o None      Surgeon Requirement:  o Pre op and lab      Specialist Notes / Test Results / Records Requested:  o 01/04/19 pre op with Dr. Rutherford Limerick [phone] and lab work left message at front desk   o Pt was negative for covid screening questions   o 01/19/19 covid test at [facility]      Future Plan / Upcoming Appts:   o None      Recent Hospitalization / ED Visit:   o None      Nav Team / Nav 1 Handoff:  o None      Email Sent To:   o None      Other / Misc:  o none     Labs / Testing at [facility]:  o None

## 2019-01-15 ENCOUNTER — Other Ambulatory Visit: Payer: Self-pay | Admitting: Foot & Ankle Surgery

## 2019-01-15 DIAGNOSIS — Z01818 Encounter for other preprocedural examination: Secondary | ICD-10-CM

## 2019-01-19 ENCOUNTER — Ambulatory Visit: Payer: No Typology Code available for payment source | Attending: Foot & Ankle Surgery

## 2019-01-19 DIAGNOSIS — Z01818 Encounter for other preprocedural examination: Secondary | ICD-10-CM | POA: Insufficient documentation

## 2019-01-19 NOTE — Pre-Procedure Instructions (Signed)
[name] 01[facility]05[facility]19 H&P be cosigned by MD or DO

## 2019-01-20 LAB — COVID-19 (SARS-COV-2): SARS CoV 2 Overall Result: NOT DETECTED

## 2019-01-22 ENCOUNTER — Encounter: Payer: Self-pay | Admitting: Anesthesiology

## 2019-01-22 NOTE — Pre-Procedure Instructions (Signed)
Date: 01/19/19  COVID result: Not detected   PEC Reviewed

## 2019-01-22 NOTE — Pre-Procedure Instructions (Signed)
·   Incoming call from Josph Macho Med, Casimiro Needle.  Inquiring about documents received from [facility] requesting HP.  He stated their office already sent.  However, explained that HP completed by NP needs cosigned by MD or DO.  He stated he will have document cosigned and will refax.

## 2019-01-23 ENCOUNTER — Ambulatory Visit: Payer: No Typology Code available for payment source | Admitting: Certified Registered"

## 2019-01-23 ENCOUNTER — Ambulatory Visit: Payer: No Typology Code available for payment source

## 2019-01-23 ENCOUNTER — Encounter: Payer: Self-pay | Admitting: Anesthesiology

## 2019-01-23 ENCOUNTER — Encounter
Admission: RE | Disposition: A | Discharge status: Home / Self Care | Payer: Self-pay | Source: Ambulatory Visit | Attending: Foot & Ankle Surgery

## 2019-01-23 ENCOUNTER — Ambulatory Visit
Admission: RE | Admit: 2019-01-23 | Discharge: 2019-01-23 | Disposition: A | Discharge status: Home / Self Care | Payer: No Typology Code available for payment source | Source: Ambulatory Visit | Attending: Foot & Ankle Surgery | Admitting: Foot & Ankle Surgery

## 2019-01-23 DIAGNOSIS — G629 Polyneuropathy, unspecified: Secondary | ICD-10-CM | POA: Insufficient documentation

## 2019-01-23 DIAGNOSIS — M89371 Hypertrophy of bone, right ankle and foot: Secondary | ICD-10-CM | POA: Insufficient documentation

## 2019-01-23 DIAGNOSIS — M24074 Loose body in right toe joint(s): Secondary | ICD-10-CM | POA: Insufficient documentation

## 2019-01-23 DIAGNOSIS — M205X1 Other deformities of toe(s) (acquired), right foot: Secondary | ICD-10-CM | POA: Insufficient documentation

## 2019-01-23 DIAGNOSIS — M2021 Hallux rigidus, right foot: Secondary | ICD-10-CM | POA: Insufficient documentation

## 2019-01-23 DIAGNOSIS — K219 Gastro-esophageal reflux disease without esophagitis: Secondary | ICD-10-CM | POA: Insufficient documentation

## 2019-01-23 HISTORY — PX: ARTHROTOMY, TOE: SHX3197

## 2019-01-23 HISTORY — PX: CHEILECTOMY, TOE: SHX3386

## 2019-01-23 SURGERY — ARTHROTOMY, TOE
Anesthesia: Anesthesia MAC / Sedation | Site: Toe | Laterality: Right | Wound class: Clean

## 2019-01-23 MED ORDER — ONDANSETRON HCL 4 MG/2ML IJ SOLN
INTRAMUSCULAR | Status: AC
Start: 2019-01-23 — End: ?
  Filled 2019-01-23: qty 2

## 2019-01-23 MED ORDER — SODIUM CHLORIDE 0.9 % IR SOLN
Status: DC | PRN
Start: 2019-01-23 — End: 2019-01-23
  Administered 2019-01-23: 800 mL

## 2019-01-23 MED ORDER — ACETAMINOPHEN 500 MG PO TABS
ORAL_TABLET | ORAL | Status: DC
Start: 2019-01-23 — End: 2019-01-23
  Filled 2019-01-23: qty 2

## 2019-01-23 MED ORDER — FENTANYL CITRATE (PF) 50 MCG/ML IJ SOLN (WRAP)
INTRAMUSCULAR | Status: DC | PRN
Start: 2019-01-23 — End: 2019-01-23
  Administered 2019-01-23 (×3): 25 ug via INTRAVENOUS

## 2019-01-23 MED ORDER — LIDOCAINE HCL (PF) 2 % IJ SOLN
INTRAMUSCULAR | Status: AC
Start: 2019-01-23 — End: ?
  Filled 2019-01-23: qty 5

## 2019-01-23 MED ORDER — PROPOFOL 10 MG/ML IV EMUL (WRAP)
INTRAVENOUS | Status: AC
Start: 2019-01-23 — End: ?
  Filled 2019-01-23: qty 20

## 2019-01-23 MED ORDER — METOCLOPRAMIDE HCL 5 MG/ML IJ SOLN
10.0000 mg | Freq: Once | INTRAMUSCULAR | Status: DC | PRN
Start: 2019-01-23 — End: 2019-01-23

## 2019-01-23 MED ORDER — MIDAZOLAM HCL 1 MG/ML IJ SOLN (WRAP)
INTRAMUSCULAR | Status: DC | PRN
Start: 2019-01-23 — End: 2019-01-23
  Administered 2019-01-23: 2 mg via INTRAVENOUS

## 2019-01-23 MED ORDER — CEFAZOLIN SODIUM-DEXTROSE 2-3 GM-%(50ML) IV SOLR
INTRAVENOUS | Status: AC
Start: 2019-01-23 — End: 2019-01-23
  Filled 2019-01-23: qty 50

## 2019-01-23 MED ORDER — BUPIVACAINE HCL 0.5 % IJ SOLN
INTRAMUSCULAR | Status: DC | PRN
Start: 2019-01-23 — End: 2019-01-23
  Administered 2019-01-23: 10 mL
  Administered 2019-01-23: 20 mL

## 2019-01-23 MED ORDER — FENTANYL CITRATE (PF) 50 MCG/ML IJ SOLN (WRAP)
25.0000 ug | INTRAMUSCULAR | Status: DC | PRN
Start: 2019-01-23 — End: 2019-01-23

## 2019-01-23 MED ORDER — CEFAZOLIN SODIUM-DEXTROSE 2-3 GM-%(50ML) IV SOLR
2.00 g | Freq: Once | INTRAVENOUS | Status: AC
Start: 2019-01-23 — End: 2019-01-23
  Administered 2019-01-23: 2 g via INTRAVENOUS

## 2019-01-23 MED ORDER — PROPOFOL 10 MG/ML IV EMUL (WRAP)
INTRAVENOUS | Status: DC | PRN
Start: 2019-01-23 — End: 2019-01-23
  Administered 2019-01-23: 50 mg via INTRAVENOUS

## 2019-01-23 MED ORDER — MEPERIDINE HCL 25 MG/ML IJ SOLN
6.2500 mg | Freq: Once | INTRAMUSCULAR | Status: DC | PRN
Start: 2019-01-23 — End: 2019-01-23

## 2019-01-23 MED ORDER — FENTANYL CITRATE (PF) 50 MCG/ML IJ SOLN (WRAP)
INTRAMUSCULAR | Status: AC
Start: 2019-01-23 — End: ?
  Filled 2019-01-23: qty 2

## 2019-01-23 MED ORDER — SCOPOLAMINE 1 MG/3DAYS TD PT72
MEDICATED_PATCH | TRANSDERMAL | Status: DC
Start: 2019-01-23 — End: 2019-01-23
  Administered 2019-01-23: 1 via TRANSDERMAL
  Filled 2019-01-23: qty 1

## 2019-01-23 MED ORDER — PROPOFOL INFUSION 10 MG/ML
INTRAVENOUS | Status: DC | PRN
Start: 2019-01-23 — End: 2019-01-23
  Administered 2019-01-23: 100 ug/kg/min via INTRAVENOUS

## 2019-01-23 MED ORDER — SCOPOLAMINE 1 MG/3DAYS TD PT72
1.0000 | MEDICATED_PATCH | Freq: Once | TRANSDERMAL | Status: DC
Start: 2019-01-23 — End: 2019-01-23

## 2019-01-23 MED ORDER — GABAPENTIN 100 MG PO CAPS
ORAL_CAPSULE | ORAL | Status: DC
Start: 2019-01-23 — End: 2019-01-23
  Filled 2019-01-23: qty 2

## 2019-01-23 MED ORDER — HYDROCODONE-ACETAMINOPHEN 5-325 MG PO TABS
1.00 | ORAL_TABLET | Freq: Four times a day (QID) | ORAL | 0 refills | Status: AC | PRN
Start: 2019-01-23 — End: ?

## 2019-01-23 MED ORDER — FAMOTIDINE 20 MG/2ML IV SOLN
INTRAVENOUS | Status: AC
Start: 2019-01-23 — End: 2019-01-23
  Administered 2019-01-23: 20 mg via INTRAVENOUS
  Filled 2019-01-23: qty 2

## 2019-01-23 MED ORDER — ONDANSETRON HCL 4 MG/2ML IJ SOLN
4.0000 mg | Freq: Once | INTRAMUSCULAR | Status: DC | PRN
Start: 2019-01-23 — End: 2019-01-23

## 2019-01-23 MED ORDER — ACETAMINOPHEN 500 MG PO TABS
1000.0000 mg | ORAL_TABLET | Freq: Once | ORAL | Status: DC
Start: 2019-01-23 — End: 2019-01-23

## 2019-01-23 MED ORDER — ACETAMINOPHEN 160 MG/5ML PO SOLN
1000.0000 mg | Freq: Once | ORAL | Status: DC
Start: 2019-01-23 — End: 2019-01-23

## 2019-01-23 MED ORDER — LACTATED RINGERS IV SOLN
75.0000 mL/h | INTRAVENOUS | Status: DC
Start: 2019-01-23 — End: 2019-01-23

## 2019-01-23 MED ORDER — KETOROLAC TROMETHAMINE 60 MG/2ML IM SOLN
INTRAMUSCULAR | Status: AC
Start: 2019-01-23 — End: ?
  Filled 2019-01-23: qty 2

## 2019-01-23 MED ORDER — GABAPENTIN 50 MG/ML UNIT DOSE
200.0000 mg | Freq: Once | ORAL | Status: AC
Start: 2019-01-23 — End: 2019-01-23

## 2019-01-23 MED ORDER — GABAPENTIN 100 MG PO CAPS
200.0000 mg | ORAL_CAPSULE | Freq: Once | ORAL | Status: DC
Start: 2019-01-23 — End: 2019-01-23

## 2019-01-23 MED ORDER — FAMOTIDINE 10 MG/ML IV SOLN (WRAP)
20.0000 mg | Freq: Once | INTRAVENOUS | Status: AC
Start: 2019-01-23 — End: 2019-01-23

## 2019-01-23 MED ORDER — DEXAMETHASONE SODIUM PHOSPHATE 4 MG/ML IJ SOLN
INTRAMUSCULAR | Status: AC
Start: 2019-01-23 — End: ?
  Filled 2019-01-23: qty 1

## 2019-01-23 MED ORDER — PROPOFOL 10 MG/ML IV EMUL (WRAP)
INTRAVENOUS | Status: AC
Start: 2019-01-23 — End: ?
  Filled 2019-01-23: qty 50

## 2019-01-23 MED ORDER — ACETAMINOPHEN 160 MG/5ML PO SOLN
325.0000 mg | Freq: Four times a day (QID) | ORAL | Status: DC | PRN
Start: 2019-01-23 — End: 2019-01-23

## 2019-01-23 MED ORDER — GABAPENTIN 50 MG/ML UNIT DOSE
ORAL | Status: AC
Start: 2019-01-23 — End: 2019-01-23
  Administered 2019-01-23: 200 mg via ORAL
  Filled 2019-01-23: qty 6

## 2019-01-23 MED ORDER — HYDROMORPHONE HCL 0.5 MG/0.5 ML IJ SOLN
0.5000 mg | INTRAMUSCULAR | Status: DC | PRN
Start: 2019-01-23 — End: 2019-01-23

## 2019-01-23 MED ORDER — MIDAZOLAM HCL 1 MG/ML IJ SOLN (WRAP)
INTRAMUSCULAR | Status: AC
Start: 2019-01-23 — End: ?
  Filled 2019-01-23: qty 2

## 2019-01-23 MED ORDER — LACTATED RINGERS IV SOLN
INTRAVENOUS | Status: DC
Start: 2019-01-23 — End: 2019-01-23
  Administered 2019-01-23: 1000 mL via INTRAVENOUS

## 2019-01-23 MED ORDER — BENZOCAINE-MENTHOL 15-3.6 MG MT LOZG
1.0000 | LOZENGE | OROMUCOSAL | Status: DC | PRN
Start: 2019-01-23 — End: 2019-01-23
  Filled 2019-01-23 (×12): qty 1

## 2019-01-23 MED ORDER — KETOROLAC TROMETHAMINE 30 MG/ML IJ SOLN
INTRAMUSCULAR | Status: DC | PRN
Start: 2019-01-23 — End: 2019-01-23
  Administered 2019-01-23: 15 mg via INTRAVENOUS

## 2019-01-23 MED ORDER — ACETAMINOPHEN 160 MG/5ML PO SOLN
ORAL | Status: AC
Start: 2019-01-23 — End: 2019-01-23
  Administered 2019-01-23: 975 mg via ORAL
  Filled 2019-01-23: qty 30.45

## 2019-01-23 MED ORDER — ONDANSETRON HCL 4 MG/2ML IJ SOLN
INTRAMUSCULAR | Status: DC | PRN
Start: 2019-01-23 — End: 2019-01-23
  Administered 2019-01-23: 4 mg via INTRAVENOUS

## 2019-01-23 MED ORDER — BUPIVACAINE HCL (PF) 0.5 % IJ SOLN
INTRAMUSCULAR | Status: AC
Start: 2019-01-23 — End: ?
  Filled 2019-01-23: qty 30

## 2019-01-23 MED ORDER — LIDOCAINE HCL 2 % IJ SOLN
INTRAMUSCULAR | Status: DC | PRN
Start: 2019-01-23 — End: 2019-01-23
  Administered 2019-01-23: 100 mg via INTRAVENOUS

## 2019-01-23 SURGICAL SUPPLY — 135 items
APPLICATOR CHLORAPREP 26 ML 70% ISOPROPYL ALCOHOL 2% CHLORHEXIDINE (Applicator) ×2 IMPLANT
APPLICATOR PRP 70% ISPRP 2% CHG 26ML (Applicator) ×2 IMPLANT
BANDAGE ACE NONSTERILE 4IN LF (Bandage) ×1
BANDAGE ACE NONSTERILE 6IN LF (Bandage) ×1
BANDAGE BULKEE II GAUZE 3.4IN (Bandage) ×2
BANDAGE CMPR CTTN PLSTR MED MTRX 5YDX4IN (Bandage) ×1
BANDAGE CMPR PLSTR CTTN MED MTRX 5YDX6IN (Bandage) ×1
BANDAGE COMPRESSION L5 YD X W4 IN ELASTIC HOOK LOOP CLOSURE STRETCH (Bandage) ×1 IMPLANT
BANDAGE ELASTIC L5 YD X W6 IN W/SELF-CLOSURE HOOK (Bandage) ×1 IMPLANT
BANDAGE GAUZE L3.6 YD X W3.4 IN 6 PLY ABSORBENT STRETCH TIGHT FINISH (Bandage) ×2 IMPLANT
BANDAGE GZE CTTN BLK2 3.6YDX3.4IN LF (Bandage) ×2
BANDAGE MEDLINE COMPRESSION L5 YD X W4 (Bandage) ×1 IMPLANT
BANDAGE MEDLINE GAUZE L3.6 YD X W3.4 IN (Bandage) ×2 IMPLANT
BANDAGE MEDLINE MEDIUM COMPRESSION L5 YD (Bandage) ×1 IMPLANT
BANDAGE STERI-STRIP 0.5X4IN (Dressing) ×1
BANDAGE STERI-STRIP 1/4INX4IN (Dressing) ×2
BANDAGE WEBRIL NSTRL 6IN (Bandage) ×1
BLADE 15 CLASSIC CARBON STEEL TISSUE (Blade) ×6 IMPLANT
BLADE 15 CLASSIC CARBON STEEL TISSUE SURGICAL (Blade) ×6 IMPLANT
BLADE BEAVER 6900 MINI BLUE (Blade) ×2 IMPLANT
BLADE OSC SAG 5.5X18X0.38MM (Blade) ×1
BLADE PREC THIN 9X0.38X25MM (Blade)
BLADE RASP SML TEAR CROSS CUT (Blade) ×1
BLADE S/SU RIBBACK CARB STL 15 (Blade) ×6
BLADE SAW REPROCESSED THK.38 MM D18 MM (Blade) ×1 IMPLANT
BLADE SAW REPROCESSED THK.38 MM D18 MM MEDIUM THIN OSCILLATE SAGITTAL (Blade) ×1 IMPLANT
BLADE SAW THK.38 MM THIN RECIPROCATE (Blade) IMPLANT
BLADE SAW THK.38 MM THIN RECIPROCATE PRECISION L25 MM X W9 MM (Blade) IMPLANT
BLADE SRG CBNSTL 15 BP RB-BCK LF STRL (Blade) ×6
BLADE SW SS THK.38MM 18MM MED THN PRCS (Blade) ×1
BLADE SW THK.38MM THN PRCS 25X9MM (Blade)
BNDG WEBRIL NONSTRL 4IN (Procedure Accessories) ×1
CAP PIN .045 WHT (Procedure Accessories) IMPLANT
CAP PROTECTOR ORTHOPEDIC PIN KIRSHNER WIRE WHITE 0.045IN 111045 (Procedure Accessories) IMPLANT
CATH JELCO IV RADIOPQ 14GX2IN (IV Supply) ×2
CATHETER IV JELCO 14GA 2IN STRL RADOPQ (IV Supply) ×2
CATHETER IV OD14 GA L2 IN RADIOPAQUE (IV Supply) ×2 IMPLANT
CATHETER IV OD14 GA L2 IN RADIOPAQUE JELCO (IV Supply) ×2 IMPLANT
CHLORAPREP APLCTR ORANGE 26ML (Applicator) ×2
COVER FLEXIBLE LIGHT HANDLE PLASTIC GREEN (Procedure Accessories) ×2 IMPLANT
COVER FLEXIBLE MEDLINE LIGHT HANDLE (Procedure Accessories) ×2 IMPLANT
COVER LGHT HNDL PLS LF STRL FLXB DISP (Procedure Accessories) ×2
CUFF TOURNIQUET CYLINDRICAL L18 IN X W4 (Procedure Accessories) ×1 IMPLANT
CUFF TOURNIQUET CYLINDRICAL L18 IN X W4 IN 2 PORT BLADDER ATS (Procedure Accessories) ×1 IMPLANT
CUFF TRNQT CYL ATS 18X4IN LF STRL 2 PORT (Procedure Accessories) ×1
CVR LGHTHNDL FLEX SFT 1PK (Procedure Accessories) ×2
DRAPE EQUIPMENT C ARM PLATE PROTECTOR (Procedure Accessories) ×1 IMPLANT
DRAPE EQUIPMENT C ARM PLATE PROTECTOR NEO (Procedure Accessories) ×1 IMPLANT
DRAPE SRG PE STRDRP 17X11IN LF STRL ADH (Drape) ×1 IMPLANT
DRAPE SURGICAL ADHESIVE STRIP SMALL (Drape) ×1
DRAPE SURGICAL ADHESIVE STRIP SMALL TOWEL MATTE FINISH L17 IN X W11 IN (Drape) ×1 IMPLANT
DRESSING OWENS GZE STRL 3X8IN (Dressing) ×1
DRESSING WND DERMACEA 8X3IN LF STRL NADH (Dressing) ×1 IMPLANT
DRESSING WOUND 8X3IN DERMACEA NONADHSV LF STRL DSPSBL (Dressing) ×1 IMPLANT
ELECTRODE ADULT PATIENT RETURN L9 FT REM POLYHESIVE ACRYLIC FOAM (Procedure Accessories) ×1 IMPLANT
ELECTRODE PAD BOVIE GROUNDING REM POLYHESIVE II 9FT E7506 (Procedure Accessories) ×1 IMPLANT
ELECTRODE PATIENT RETURN L9 FT VALLEYLAB (Procedure Accessories) ×1 IMPLANT
ELECTRODE PT RTN RM PHSV ACRL FM C30- LB (Procedure Accessories) ×1
GAUZE FLUFF STERL 6X6 (Dressing) ×2 IMPLANT
GAUZE KERLIX 4.5X4YDS (Dressing) ×2 IMPLANT
GAUZE SPNG FLUFF STRL 4INX4IN (Perfusion Supplies) ×2 IMPLANT
GAUZE SPONGE VERSALON 4PLY 4X4 (Dressing) ×1
GLOVE SRG NTR RBR 7 BGL IND 288X91MM LTX (Glove) ×1
GLOVE SRG PCP 6 BGL SKNSN 285X77MM LF (Glove) ×2
GLOVE SRG PLISPRN 6.5 BGL PI MIC 285MM (Glove) ×2 IMPLANT
GLOVE SURG BIOGEL INDIC SZ 7.0 (Glove) ×1
GLOVE SURG BIOGEL SKNSNS SZ6 (Glove) ×2
GLOVE SURGICAL 6 BIOGEL SKINSENSE POWDER (Glove) ×2 IMPLANT
GLOVE SURGICAL 6 BIOGEL SKINSENSE POWDER FREE BEAD CUFF MICRO (Glove) ×2 IMPLANT
GLOVE SURGICAL 7 BIOGEL INDICATOR POWDER (Glove) ×1 IMPLANT
GLOVE SURGICAL 7 BIOGEL INDICATOR POWDER FREE SMOOTH BEAD CUFF (Glove) ×1 IMPLANT
GLOVES BIOGEL PI MICRO SZ 6.5 (Glove) ×2
IMPLANT CARTIVA MTP 12MM (Toe) ×2 IMPLANT
PACK MINIVIEW C-ARM DRPE/PLTE (Procedure Accessories) ×2
PACK SRG LF STRL XTRMT DISP ~~LOC~~ (Pack) ×1 IMPLANT
PAD ELECTROSRG GRND REM W CRD (Procedure Accessories) ×1
PAD ELECTROSURG GRND NON REM (Procedure Accessories) ×2 IMPLANT
PAD PREP CUFF 24X41IN W 9IN (Prep) ×1
PAD SRGPRP 44X24IN NS CUF 9IN (Prep) ×1 IMPLANT
PADDING CAST L4 YD X W4 IN COHESION HAND TEARABLE SELF BOND SPECIALIST (Procedure Accessories) ×1 IMPLANT
PADDING CAST L4 YD X W4 IN NONABSORBENT (Cast) ×2 IMPLANT
PADDING CAST L4 YD X W4 IN NONABSRBNT AUTOCLAVABLE PROTOUCH HIGH LOFT (Cast) ×2 IMPLANT
PADDING CAST L4 YD X W6 IN NONWOVEN (Cast) ×1 IMPLANT
PADDING CAST L4 YD X W6 IN NONWOVEN DACRON POROUS (Cast) ×1 IMPLANT
PADDING CAST L4 YD X W6 IN UNDERCAST (Bandage) ×1 IMPLANT
PADDING CAST L4 YD X W6 IN UNDERCAST HAND TEARABLE SPECIALIST 100 (Bandage) ×1 IMPLANT
PADDING CAST SYNTHETIC 4INX4YD (Cast) ×2
PADDING CAST SYNTHETIC 6INX4YD (Cast) ×1
PADDING CST CTTN SPCLST 100 4YDX4IN LF (Procedure Accessories) ×1 IMPLANT
PADDING CST CTTN SPCLST 100 4YDX6IN LF (Bandage) ×1
PADDING CST DCRN POR 4YDX6IN LF NS NWVN (Cast) ×1
PADDING CST HI LOFT PRTCH 4YDX4IN LF NS (Cast) ×2
RASP L11 MM X W5 MM SMALL TPS STAINLESS (Blade) ×1 IMPLANT
RASP L11 MM X W5 MM SMALL TPS STAINLESS STEEL SURGICAL TEAR CROSS CUT (Blade) ×1 IMPLANT
RASP SRG SS SM TPS 11X5MM LF STRL TEAR (Blade) ×1
SLN ALC ISOPROPYL 70 4OZ (Scrub Supplies) ×1
SOLUTION ANSEP 70% ISPRP 4OZ LF RUB BTL (Scrub Supplies) ×1
SOLUTION ANTISEPTIC RUB BOTTLE MEDLINE (Scrub Supplies) ×1 IMPLANT
SOLUTION BETADINE 4OZ (Scrub Supplies) ×1
SOLUTION SRGSCRB 10% PVP IOD 4OZ PLS BTL (Scrub Supplies) ×1 IMPLANT
SOLUTION SURGICAL SCRUB 10% PVP IODINE 4OZ PLASTIC BOTTLE AQUEOUS SKIN (Scrub Supplies) ×1 IMPLANT
SPLINT ORTH PLSTR OF PARIS SPCLST 30X5IN (Cast) ×1
SPLINT ORTHOPEDIC L30 IN X W5 IN EXTRA (Cast) ×1 IMPLANT
SPLINT ORTHOPEDIC L30 IN X W5 IN EXTRA FAST SET SPECIALIST PLASTER OF (Cast) ×1 IMPLANT
SPLINT PLASTER EFS 5X30IN (Cast) ×1
SPONGE GAUZE 12 PLY SUPER FLUFF LATEX FREE BULKEE 4X4IN NON25844 (Perfusion Supplies) ×1 IMPLANT
SPONGE GAUZE 16PLY STRL 4X4 (Sponge) ×2
SPONGE GAUZE L4 IN X W4 IN 16 PLY (Sponge) ×2 IMPLANT
SPONGE GAUZE L4 IN X W4 IN 16 PLY MAXIMUM ABSORBENT TRAY CURITY (Sponge) ×2 IMPLANT
SPONGE GAUZE L4 IN X W4 IN 4 PLY HIGH (Dressing) ×1 IMPLANT
SPONGE GAUZE L4 IN X W4 IN 4 PLY HIGH ABSORBENT NONWOVEN LINT FREE (Dressing) ×1 IMPLANT
SPONGE GZE PLS CTTN CRTY 4X4IN LF STRL (Sponge) ×2
SPONGE GZE RYN PLSTR CRTY VRSLN 4X4IN LF (Dressing) ×1
STRIP SKIN CLOSURE L4 IN X W1/2 IN (Dressing) ×1 IMPLANT
STRIP SKIN CLOSURE L4 IN X W1/2 IN REINFORCE STERI-STRIP POLYESTER (Dressing) ×1 IMPLANT
STRIP SKIN CLOSURE L4 IN X W1/4 IN (Dressing) ×2 IMPLANT
STRIP SKIN CLOSURE L4 IN X W1/4 IN REINFORCE STERI-STRIP POLYESTER (Dressing) ×2 IMPLANT
STRIP SKNCLS PLSTR STRSTRP 4X.25IN LF (Dressing) ×2
STRIP SKNCLS PLSTR STRSTRP 4X.5IN LF (Dressing) ×1
SYRINGE 10CC COLEMN LUER LOCK (Syringes, Needles) ×2 IMPLANT
SYRINGE 20 ML BD LUER-LOK MEDICAL (Syringes, Needles) ×2 IMPLANT
SYRINGE LUER-LOK STERILE 20CC (Syringes, Needles) ×2
SYRINGE MED 20ML LL LF STRL (Syringes, Needles) ×2 IMPLANT
TOURNIQUET 18IN STRL (Procedure Accessories) ×1
TOWEL L27 IN X W17 IN COTTON PREWASH (Other) ×1 IMPLANT
TOWEL L27 IN X W17 IN COTTON PREWASH DELINT HIGH ABSORBENT BLUE (Other) ×1 IMPLANT
TOWEL OR DISP 10PK (Other) ×1
TOWEL SRG CTTN 27X17IN LF STRL PREWASH (Other) ×1
TOWEL STERIDRAPE 17X11IN (Drape) ×1
TRAY EXTREMITY ~~LOC~~ (Pack) ×1
TRAY SURGICAL EXTREMITY ~~LOC~~ (Pack) ×1 IMPLANT
WIRE FIXATION OD.045 IN L6 IN KIRSCHNER (Instrument) IMPLANT
WIRE FIXATION OD.045 IN L6 IN KIRSCHNER STAINLESS STEEL 2 END TROCAR (Instrument) IMPLANT
WIRE FX SS KRSH .045IN 6IN NS 2 END TROC (Instrument)
WIRE K DBL TROC .045X6IN (Instrument)

## 2019-01-23 NOTE — Brief Op Note (Signed)
BRIEF POST-OP NOTE    Date Time: 8:31 AM, 01/23/2019      Patient Name:   [name]    Date of Operation:   01/23/2019    Providers Performing:   Surgeon(s):  Latanya Presser, My [place], DPM  Ferne Coe, [place]    Assistants:   Ferne Coe DPM  [facility] on call ([phone]) for order clarifications    Diagnosis:   Hallux rigidus, unspecified laterality [M20.20]  Loose body of joint of toe, unspecified laterality [M24.076]    Procedure:   Procedure(s) (LRB):  ARTHROTOMY, TOE (Right)  CHEILECTOMY, TOE (Right)    Cartiva implant, 1st MPJ (Right)       Anesthesia:    Monitor Anesthesia Care   Jovita Gamma, MD   Anesthesiologist: Jovita Gamma, MD  CRNA: Eula Listen, CRNA     Estimated Blood Loss:   Less than 5 cc    Hemostasis:   Anatomical dissection, mechanical compression, electrocautery    A well padded ankle tourniquet inflated to 250 mmHg    Total Tourniquet Time Documented:  Ankle (Right) - 23 minutes  Total: Ankle (Right) - 23 minutes      Implants:     Implant Name Type Inv. Item Serial No. Manufacturer Lot No. LRB No. Used Action   CARTIVA SYNTHETIC CARTILAGE IMPLANT      [id number] Right 1 Implanted       Materials:   Vicryl and Monocryl    Injectables:   PRE-operatively:    20 cc of 0.5% bupivacaine plain    POST-operatively:   10 cc of 0.5% bupivacaine plain     Specimens:        SPECIMENS (last 24 hours)      Pathology Specimens     Row Name 01/23/19 0700                Specimen Information    Specimen Testing Required  None per surgeon             Antibiotics:   2 grams of Ancef  preoperatively     Drains:   None    Complications:   Patient tolerated the procedure well without complication.    Findings:   Please see op-report    Condition:   Patient was taken to PACU in good condition and all vital signs stable    Alva Garnet, DPM  Resident - Foot and Ankle Surgery  [facility], PGY-2  [phone] (Podiatry on-call pager)  [phone] (Personal pager)

## 2019-01-23 NOTE — Discharge Instr - AVS First Page (Addendum)
Foot & Ankle Surgery Patient Discharge Instructions:       Arrange to have an adult drive you home after surgery. If you had general anesthesia, it may take a day or more to fully recover. So, for at least the next 24 hours: Do not drive or use machinery or power tools; do not drink alcohol; and do not make any major decisions.     What to Expect   It is normal to have the following:   Bruising and slight swelling of the foot and toes   A small amount of blood on the dressing  Bandages:   Keep your dressings clean, dry, and intact. Do not remove or change.   When bathing/showering, cover the bandage or cast with a plastic bag to keep it dry.   Dont remove your bandage until your doctor tells you to. If your bandage gets wet or dirty, check with your doctor. You can likely replace it with a clean, dry one.  Activity:   Non-weightbearing to operative limb in post-op shoe    Sit or lie down when possible. Put a pillow under your heel to raise your foot above the level of your heart.   Place ice on foot or behind knee for 20 minutes every hour that you are awake for the first 24-48 hours.   You can drive again when instructed by your doctor.   Wear your surgical shoe at all times unless told otherwise by your health care provider.   Use crutches or a cane as directed.   Follow your doctors instructions about putting weight on your foot.  Diet:   Start with liquids and light foods (such as dry toast, bananas, and applesauce). As you feel up to it, slowly return to your normal diet.   Drink at least six to eight glasses of water or other nonalcoholic fluids a day.   To avoid nausea, eat before taking narcotic pain medications.  Medications:   Take all medications as instructed.   Take pain medications on time. Do not wait until the pain is bad before taking your medications.   Avoid alcohol while on pain medications.  Call your doctor if:   Continuous bleeding through the bandage   Excessive  swelling, increased bleeding, or redness   Fever over 100.93F or chills   Pain unrelieved by pain medications   Foot feels cold to the touch or numb   Chest pain or shortness of breath    Anesthesia Discharge Instructions: After Your Surgery  Youve just had surgery. During surgery you were given medicine called anesthesia to keep you relaxed and comfortable. After surgery you may have some pain or nausea. This is common. Your doctor or nurse will show you how to take care of yourself when you go home. He or she will also answer your questions. Here are some tips for feeling better and getting well after surgery.    For the first 24 hours after your surgery:   Do not drive or use heavy equipment.    Do not make important decisions or sign legal papers.   Do not drink alcohol.   Have someone stay with you, if needed. He or she can watch for problems and help keep you safe.   Have an adult family member or friend drive you home.    Managing pain  If you have pain after surgery, pain medicine will help you feel better. Take it as ordered by your surgeon, before pain becomes  severe. Also, ask your doctor about other ways to control pain. This might be with rest, ice, repositioning, and elevation. Follow any other instructions your surgeon or nurse gives you.    Tips for taking pain medicine:    Stay on schedule with your medication   Most pain relievers taken by mouth need at least 20 to 30 minutes to start to work.   Taking medicine on a schedule can help you remember to take it. Try to time your medicine so that you can take it before starting an activity. This might be before you get dressed, go for a walk, or sit down for dinner.    Common side effects of prescription pain medications   Pain medicines can cause nausea. Eat a little food before taking pain medicine to avoid this.   Constipation is a common side effect of pain medicines. Drinking lots of fluids and eating foods, such as fruits and  vegetables, that are high in fiber can also help.    Call your doctor before taking any medicines such as laxatives or stool softeners to help ease constipation. Also, ask if you should avoid any foods. Remember, do not take laxatives unless your surgeon has prescribed them   Drinking alcohol and taking pain medicines can cause dizziness and slow your breathing. It can even be deadly. Do not drink alcohol while taking pain medicines.   Pain medicines can make you react more slowly to things. Do not drive or run machinery while taking pain medicines.    Important facts about Acetaminophen (Tylenol)    Acetaminophen, also known as Tylenol is a common, over the counter pain reliever.   Your health care provider may tell you to take acetaminophen to help ease your pain. Ask him or her how much you should take each day.     Recommended total dose of acetaminophen (Tylenol) is 3000mg  over 24 hours   Check your prescription pain medication labels to see if it contains acetaminophen.   Your prescription pain medication contains _325 mg per tablet of acetaminophen.   Some over the counter (OTC) medicines also contain acetaminophen (Tylenol).     Be sure to check the labels for acetaminophen in the ingredients.     Using both prescription pain medicines and over the counter (OTC) acetaminophen for pain can cause you to overdose on acetaminophen (Tylenol).     More than 4000mg  of acetaminophen in 24 hours may lead to liver failure.    If you have questions or do not understand the information, ask your pharmacist or health care provider to explain it to you before you take your medicine.    Managing nausea  Some people have an upset stomach after surgery. This is often because of anesthesia, pain, pain medicines, or the stress of surgery. If you were on a special food plan before surgery, ask your doctor if you should follow it while you get better. The following are tips to help you manage nausea after  anesthesia:   Do not push yourself to eat. Your body will tell you when to eat and how much.   Start off with clear liquids and soup. They are easier to digest.   Next try semi-solid foods such as mashed potatoes, applesauce, and gelatin, as you feel ready.   Slowly move to solid foods. Dont eat fatty, rich, or spicy foods at first.   Do not force yourself to have 3 large meals a day. Instead eat smaller amounts more often.  Take pain medicines with a small amount of food, such as crackers or toast, to avoid nausea.     Call your surgeon if:   You have worsening pain an hour after taking pain medicine. The pain medicine may not be strong enough.   You feel too sleepy, dizzy, or groggy. The pain medicine may be too strong.   You have side effects like persistent nausea, vomiting, or skin changes such as rash, itching, or hives.    You have bleeding through your dressing or your dressing becomes saturated.   You have signs of infection such as redness, fever, or increased/foul smelling drainage.      If you have obstructive sleep apnea (OSA)  You were given anesthesia medicine during surgery to keep you comfortable and free of pain. After surgery, you may have more apnea spells because of this medicine and other medicines you were given. These episodes may last longer than usual.   At home:    Keep using the continuous positive airway pressure (CPAP) device when you sleep. Unless your health care provider tells you not to, use it when you sleep, or take a nap; day or night. CPAP is a common device used to treat OSA.   Sleep/nap in upright position(ie. with pillows), or use a recliner for first week and/or while on opioid medications or medications that are sedating   Another adult needs to be with you at all times during the first 24 hours home.   Limit opioid use when possible and use alternative comfort measures.    Talk with your provider before taking any pain medicine, muscle relaxants, or  sedatives. Your provider will tell you about the possible dangers of taking these medicines.

## 2019-01-23 NOTE — H&P (Signed)
Podiatric Surgery Interval H&P    Date Time: 01/23/19 7:14 AM  Patient Name: [name], [name]    Patient S&E by me, current H&P on file verified within 30 days and no changes to H&P noted.    General: AAOx3, NAD, well appearance  Heart: Regular rate  Lungs: symmetric air entry, normal effort  Abdomen: non-tender  LE: Palpable pedal pulses to the R foot with brisk CFT and intact motor and sensory function to the digits.    [name] is a 58 y.o. female with right hallux limitus.    - Patient stable, proceed with planned surgery:  Procedure(s) (LRB):  ARTHROTOMY, TOE (Right)  CHEILECTOMY, TOE (Right)  - Prophylactic antibiotics: 2 grams of Ancef  preoperatively  - All alternatives, risks, and complications discussed with patient and all question/concerns addressed. Patient exhibited appropriate understanding of all discussion points. Informed consent obtained with no guarantees to surgical outcome given or implied.    Alva Garnet, DPM  Resident - Foot and Ankle Surgery  [facility], PGY-2  [phone] (Podiatry on-call pager)  [phone] (Personal pager)

## 2019-01-23 NOTE — Anesthesia Postprocedure Evaluation (Signed)
Anesthesia Post Evaluation    Patient: [name]    Procedure(s) with comments:  ARTHROTOMY, TOE - RIGHT 1ST METATARSOPHALANGEAL JOINT ARTHROTOMY/ CHEILECTOMY WITH CARTIVA IMPLANT  CHEILECTOMY, TOE    Anesthesia type: MAC    Last Vitals:   Vitals Value Taken Time   BP 137/81 01/23/19 0840   Temp 36.4 C (97.5 F) 01/23/19 0840   Pulse 84 01/23/19 0840   Resp 14 01/23/19 0840   SpO2 98 % 01/23/19 0840       Anesthesia Post Evaluation:     Patient Evaluated: PACU  Patient Participation: complete - patient participated  Level of Consciousness: awake and alert  Pain Score: 1  Pain Management: adequate  Multimodal analgesia pain management approach    Airway Patency: patent    Anesthetic complications: No      PONV Status: none    Cardiovascular status: acceptable  Respiratory status: acceptable  Hydration status: acceptable        Anesthesia Qualified Clinical Data Registry 2018    PACU Reintubation  Did the Patient have general anesthesia with intubation: No        PONV Adult  Is the patient aged 14 or older: Yes  Did the patient receive recieve a general anesthestic: Yes  Does the patient have 3 or more risk factors for PONV? No  Did the patient receive anti-emetics from at least two classes of medications? Yes      PONV Pediatric  Is the patient aged 75-17? No            PACU Transfer Checklist Protocol  Was the patient transferred to the PACU at the conclusion of surgery? Yes  Was a checklist or transfer protocol used? Yes    ICU Transfer Checklist Protocol  Was the patient transferred to the ICU at the conclusion of surgery? No      Post-op Pain Assessment Prior to Anesthesia Care End  Age >=18 and assessed for pain in PACU: Yes  Pacu pain score <7/10: Yes      Perioperative Mortality  Perioperative mortality prior to Anesthesia end time: No    Perioperative Cardiac Arrest  Did the patient have an unanticipated intraoperative cardiac arrest between anesthesia start time and anesthesia end time?  No    Unplanned Admission to ICU  Did the patient have an unplanned admission to the ICU (not initially anticipated at anesthesia start time)? No      Signed by: Jovita Gamma, 01/23/2019 9:05 AM

## 2019-01-23 NOTE — Transfer of Care (Signed)
Anesthesia Transfer of Care Note    Patient: [name]    Procedures performed: Procedure(s) with comments:  ARTHROTOMY, TOE - RIGHT 1ST METATARSOPHALANGEAL JOINT ARTHROTOMY/ CHEILECTOMY WITH CARTIVA IMPLANT  CHEILECTOMY, TOE    Anesthesia type: MAC    Patient location:PACU    Last vitals:   Vitals:    01/23/19 0833   BP: 122/82   Pulse: 80   Resp: 12   Temp: 36.7 C (98.1 F)   SpO2: 100%       Post pain: Patient not complaining of pain, continue current therapy      Mental Status:awake    Respiratory Function: tolerating face mask    Cardiovascular: stable    Nausea/Vomiting: patient not complaining of nausea or vomiting    Hydration Status: adequate    Post assessment: no apparent anesthetic complications    Signed by: Estil Daft [facility]  01/23/19 8:34 AM

## 2019-01-23 NOTE — Anesthesia Preprocedure Evaluation (Addendum)
Anesthesia Evaluation    AIRWAY    Mallampati: II    TM distance: >3 FB  Neck ROM: full  Mouth Opening:full  Planned to use difficult airway equipment: No CARDIOVASCULAR    cardiovascular exam normal and regular       DENTAL         PULMONARY         OTHER FINDINGS                Relevant Problems   PULMONARY   (+) Pneumonia   (+) Pneumonia of right upper lobe due to infectious organism               Anesthesia Plan    ASA 2     MAC                     intravenous induction   Detailed anesthesia plan: MAC      Post Op: plan for postoperative opioid use  Trial extubation is not planned.  Post op pain management: per surgeon        Plan discussed with CRNA.    ECG reviewed  pertinent labs reviewed             Signed by: Estil Daft [facility] 01/23/19 7:15 AM

## 2019-01-23 NOTE — Op Note (Signed)
Full Operative Report    Date of Operation: 2:33 PM, 01/23/2019     Patient: [name] - 58 y.o. female    Providers: Latanya Presser, [place], [place]    Assistant: Ferne Coe DPM    Diagnosis: Hallux rigidus, unspecified laterality [M20.20]  Loose body of joint of toe, unspecified laterality [M24.076]    Procedure:   1. Implantation of synthetic implant, 1st MPJ (Right)  2. Tenolysis of extensor hallucis longus (Right)      Anesthesia:  Monitor Anesthesia Care   No responsible provider has been recorded for the case.   Anesthesiologist: Jovita Gamma, MD  CRNA: Eula Listen, CRNA     Estimated Blood Loss: Less than 5 cc    Hemostasis:  1) Anatomical dissection, mechanical compression, electrocautery  2) A well padded ankle tourniquet inflated to 250 mmHg    Total Tourniquet Time Documented:  Ankle (Right) - 23 minutes  Total: Ankle (Right) - 23 minutes      Implants:  Implants     Allograft    Graft Bone Matrix Strip 5x10cm - [id number] - Implanted  Spine Lumbar    Inventory item: GRAFT BONE MATRIX STRIP 5X10CM Model/Cat number: [id number]    Manufacturer: [employer] Lot number: [id number]    As of 05/14/2014     Status: Implanted                  Graft    Graft Duraform 3x3in - [id number] - Implanted   (Right) Spine Lumbar    Inventory item: GRAFT DURAFORM 3X3IN Model/Cat number: [id number]    Manufacturer: [employer] Lot number: [id number]    As of 02/28/2013     Status: Implanted                  Bone    Infuse Bn Graft Small Kit - [id number] - Implanted  Spine Lumbar    Inventory item: INFUSE BN GRAFT SMALL KIT Model/Cat number: [id number]    Manufacturer: [employer] Lot number: [id number]    As of 05/14/2014     Status: Implanted                  Cap    Cap Locking [place] - [id number] - Implanted  Spine Lumbar    Inventory item: CAP LOCKING CREO Model/Cat number: [id number]    Manufacturer: [employer] Lot number: [id number]    As of 05/14/2014     Status: Implanted                  Implant     Tisseel 4ml - Implanted   (Right) Spine Lumbar    Model/Cat number: [id number] Lot number: [id number]    As of 01/10/2017     Status: Implanted                  Tisseel 4ml - Implanted   (Right) Spine Lumbar    Model/Cat number: [id number] Lot number: [id number]    As of 02/28/2013     Status: Implanted                  Cartiva Synthetic Cartilage Implant - Implanted   (Right) Toe    Lot number: [id number]      As of 01/23/2019     Status: Implanted                  Rods    Rod Creo 5.5x67mm - [id number] - Implanted  Spine  Lumbar    Inventory item: ROD CREO 5.5X65MM Model/Cat number: [id number]    Manufacturer: [employer] Lot number: [id number]    As of 05/14/2014     Status: Implanted                  Screw    Screw Low Prof Canu 4.0x22mm - [id number] - Implanted   (Left) Foot    Inventory item: SCREW LOW PROF CANU 4.0X28MM Model/Cat number: [id number]    As of 06/01/2011     Status: Implanted                  Screw Low Pro Canu 4.0x4mm - [id number] - Implanted   (Left) Foot    Inventory item: SCREW LOW PRO CANU 4.0X38MM Model/Cat number: [id number]    Manufacturer: [employer]      As of 06/01/2011     Status: Implanted                  Screw Creo Preasmbld 6.5x11mm - [id number] - Implanted      Inventory item: [facility] 6.5X40MM Model/Cat number: [id number]    Manufacturer: [employer] Lot number: [id number]    As of 05/14/2014     Status: Implanted                  Screw Creo 6.5x35mm - [id number] - Implanted      Inventory item: SCREW CREO 6.5X45MM Model/Cat number: [id number]    Manufacturer: [employer] Lot number: [id number]    As of 05/14/2014     Status: Implanted                         Materials: Vicryl and Monocryl    Injectables:  1) Pre-operatively: 20 cc of 0.5% bupivacaine plain  2) Post-operatively: 10 cc of 0.5% bupivacaine plain     Specimens:      SPECIMENS (last 24 hours)      Pathology Specimens     Row Name 01/23/19 0700                Specimen Information    Specimen Testing Required  None per surgeon               Antibiotics: 2 grams of Ancef  preoperatively    Drains: None    Complications: Patient tolerated the procedure well without complication.    Indications for Procedure:  [name] presents to [place], [place], [place] with a chief complaint of right hallux limitus. The patient has failed conservative treatments of various modalities. Of note patient had previously undergone a distal 1st metatarsal osteotomy from another surgeon. At this time the patient has elected to proceed with surgical correction. All alternatives, risks, and complications of the procedures were thoroughly explained to the patient. Patient exhibits appropriate understanding of all discussion points and informed consent was signed and obtained in the chart with no guarantees to surgical outcome given or implied.    Description of Procedure:  Patient was brought to the operating room and placed on the operative table in the supine position. Patient was secured to the table with safety belt, a contralateral SCD was placed, and all bony prominences were well padded. A surgical timeout was performed and all members of the operating room, the procedure, and the surgical site were identified. IV sedation anesthesia occurred. Local anesthetic as previously described was then injected about the operative field in a local  infiltrative block.     A well padded pneumatic tourniquet was placed to the right ankle. The right lower extremity was then prepped and draped in the usual sterile manner. The pneumatic tourniquet was inflated. The following procedure then began.    Attention was then directed on the dorsomedial aspect of the first metatarsophalangeal joint on theright foot. A linear incision using a 15 blade was placed directly over the first MPJ parallel and medial to the course of the extensor hallucis longus tendon using the scar from previous surgery.The incision was deepened through subcutaneous tissues. All the bleeders were  identified, cut, clamped, and cauterized. All neurovascular structures were identified and retracted from the site. The incision was deepened to the level of the capsule and the periosteum of the joint. Using sharp and dull dissection, the periosteal and capsular attachments were mobilized from the head of the metatarsal. Hypertrophic bone was noted to the head of the metatarsal head, especially on the dorsal surface. The prominence  was adequately exposed and resected with the use of a sagittal saw. The area was remodeled using saggital saw and power rasp.  Adequate contouring was accomplished to a more anatomical appearance including resection of bony overgrowth medially and laterally. Any bony overgrowth of the phalangeal base was also removed at this time with a rongeur. Next, the joint was taken through full range of motion and there was an evident increase in the sagittal plane. The extensor hallucis tendon was released from surrounding scar tissue with an increase in sagittal plane motion noted. All remaining bony edges were smoothed. The area was copiously flushed with saline.      Next, a provisional wire was placed in the center of the metatarsal head and then reamed. The wire was then removed. The 12 mm size Cartiva implant was then placed into the metatarsal head under manufacturer's guidelines.  The implant was noted to be well aligned and secured. Then, using the suture materials previously described, the site was closed in anatomic layers and the skin was well approximated under minimal tension.    The surgical site was then dressed with steri-strips, betadine, owens silk, 4x4 gauze, ABD pad and ACE wrap. The tourniquet was deflated with immediate vascular return noted to the operative foot. The patient tolerated both the procedure and anesthesia well with vital signs stable throughout. The patient was transferred from the OR to recovery under the discretion of anesthesia.    Condition: Patient was  taken to PACU in good condition and all vital signs stable and neurovascular status intact to the operative limb.    Discharge:  The patient will be discharged with all postoperative instructions and prescriptions when cleared by anesthesia and will follow up in the office of the surgeon.    Latanya Presser, [place], [place] will follow the patient throughout the entire post-operative course and the patient is aware of all post-operative protocols in place. The patient will follow the protocol of rest, ice, and elevation. The patient will be non-weightbearing in a post-op shoe to the operative limb until further instructed. The dressing is to remain clean, dry, and intact.       Alva Garnet, DPM  Resident - Foot and Ankle Surgery  [facility], PGY-2  [phone] (Podiatry on-call pager)  [phone] (Personal pager)

## 2019-01-25 ENCOUNTER — Encounter (INDEPENDENT_AMBULATORY_CARE_PROVIDER_SITE_OTHER): Payer: Self-pay

## 2019-01-26 ENCOUNTER — Encounter (INDEPENDENT_AMBULATORY_CARE_PROVIDER_SITE_OTHER): Payer: Self-pay

## 2019-02-14 ENCOUNTER — Telehealth (INDEPENDENT_AMBULATORY_CARE_PROVIDER_SITE_OTHER): Payer: Self-pay

## 2019-02-14 NOTE — Telephone Encounter (Signed)
Unable to reach patient,. Line stayed continuously busy.    Rishard Delange, Care Coordinator

## 2019-02-25 ENCOUNTER — Encounter (INDEPENDENT_AMBULATORY_CARE_PROVIDER_SITE_OTHER): Payer: Self-pay

## 2019-02-26 ENCOUNTER — Other Ambulatory Visit: Payer: Self-pay | Admitting: Physician Assistant

## 2019-02-26 ENCOUNTER — Encounter (INDEPENDENT_AMBULATORY_CARE_PROVIDER_SITE_OTHER): Payer: Self-pay

## 2019-03-13 ENCOUNTER — Encounter (INDEPENDENT_AMBULATORY_CARE_PROVIDER_SITE_OTHER): Payer: Self-pay

## 2019-03-21 ENCOUNTER — Other Ambulatory Visit: Payer: Self-pay | Admitting: Chiropractor

## 2019-03-21 DIAGNOSIS — M5416 Radiculopathy, lumbar region: Secondary | ICD-10-CM

## 2019-03-22 ENCOUNTER — Ambulatory Visit
Admission: RE | Admit: 2019-03-22 | Discharge: 2019-03-22 | Disposition: A | Discharge status: Home / Self Care | Payer: No Typology Code available for payment source | Source: Ambulatory Visit | Attending: Chiropractor | Admitting: Chiropractor

## 2019-03-22 DIAGNOSIS — M5136 Other intervertebral disc degeneration, lumbar region: Secondary | ICD-10-CM | POA: Insufficient documentation

## 2019-03-22 DIAGNOSIS — M5126 Other intervertebral disc displacement, lumbar region: Secondary | ICD-10-CM | POA: Insufficient documentation

## 2019-03-22 DIAGNOSIS — G834 Cauda equina syndrome: Secondary | ICD-10-CM | POA: Insufficient documentation

## 2019-03-22 DIAGNOSIS — M5416 Radiculopathy, lumbar region: Secondary | ICD-10-CM

## 2019-03-22 MED ORDER — GADOBUTROL 1 MMOL/ML IV SOLN
7.50 mL | Freq: Once | INTRAVENOUS | Status: AC | PRN
Start: 2019-03-22 — End: 2019-03-22
  Administered 2019-03-22: 10:00:00 7.5 mmol via INTRAVENOUS
  Filled 2019-03-22: qty 7.5

## 2019-03-27 ENCOUNTER — Encounter (INDEPENDENT_AMBULATORY_CARE_PROVIDER_SITE_OTHER): Payer: Self-pay

## 2019-03-28 ENCOUNTER — Encounter (INDEPENDENT_AMBULATORY_CARE_PROVIDER_SITE_OTHER): Payer: Self-pay

## 2019-04-28 ENCOUNTER — Encounter (INDEPENDENT_AMBULATORY_CARE_PROVIDER_SITE_OTHER): Payer: Self-pay

## 2019-06-05 ENCOUNTER — Encounter (INDEPENDENT_AMBULATORY_CARE_PROVIDER_SITE_OTHER): Payer: Self-pay

## 2019-06-10 ENCOUNTER — Ambulatory Visit (INDEPENDENT_AMBULATORY_CARE_PROVIDER_SITE_OTHER): Payer: No Typology Code available for payment source

## 2019-06-10 DIAGNOSIS — Z23 Encounter for immunization: Secondary | ICD-10-CM

## 2019-09-26 ENCOUNTER — Encounter (INDEPENDENT_AMBULATORY_CARE_PROVIDER_SITE_OTHER): Payer: Self-pay

## 2019-10-25 ENCOUNTER — Encounter (INDEPENDENT_AMBULATORY_CARE_PROVIDER_SITE_OTHER): Payer: Self-pay

## 2019-10-26 ENCOUNTER — Encounter (INDEPENDENT_AMBULATORY_CARE_PROVIDER_SITE_OTHER): Payer: Self-pay

## 2019-10-26 ENCOUNTER — Other Ambulatory Visit: Payer: Self-pay | Admitting: Physician Assistant

## 2019-11-25 ENCOUNTER — Encounter (INDEPENDENT_AMBULATORY_CARE_PROVIDER_SITE_OTHER): Payer: Self-pay

## 2019-11-26 ENCOUNTER — Encounter (INDEPENDENT_AMBULATORY_CARE_PROVIDER_SITE_OTHER): Payer: Self-pay

## 2019-12-26 ENCOUNTER — Encounter (INDEPENDENT_AMBULATORY_CARE_PROVIDER_SITE_OTHER): Payer: Self-pay

## 2019-12-27 ENCOUNTER — Encounter (INDEPENDENT_AMBULATORY_CARE_PROVIDER_SITE_OTHER): Payer: Self-pay

## 2020-01-26 ENCOUNTER — Encounter (INDEPENDENT_AMBULATORY_CARE_PROVIDER_SITE_OTHER): Payer: Self-pay

## 2020-12-04 ENCOUNTER — Other Ambulatory Visit: Payer: Self-pay | Admitting: Physician Assistant

## 2020-12-04 DIAGNOSIS — M25562 Pain in left knee: Secondary | ICD-10-CM

## 2020-12-05 ENCOUNTER — Ambulatory Visit
Admission: RE | Admit: 2020-12-05 | Discharge: 2020-12-05 | Disposition: A | Discharge status: Home / Self Care | Payer: No Typology Code available for payment source | Source: Ambulatory Visit | Attending: Physician Assistant | Admitting: Physician Assistant

## 2020-12-05 DIAGNOSIS — M25562 Pain in left knee: Secondary | ICD-10-CM | POA: Insufficient documentation

## 2021-07-16 ENCOUNTER — Encounter (HOSPITAL_BASED_OUTPATIENT_CLINIC_OR_DEPARTMENT_OTHER): Payer: Self-pay | Admitting: Orthopaedic Surgery

## 2021-07-18 ENCOUNTER — Other Ambulatory Visit: Payer: Self-pay | Admitting: Chiropractor

## 2021-07-18 DIAGNOSIS — M961 Postlaminectomy syndrome, not elsewhere classified: Secondary | ICD-10-CM

## 2021-07-22 ENCOUNTER — Ambulatory Visit: Payer: Commercial Managed Care - POS

## 2021-07-27 ENCOUNTER — Other Ambulatory Visit: Payer: Self-pay | Admitting: Chiropractor

## 2021-07-27 ENCOUNTER — Ambulatory Visit
Admission: RE | Admit: 2021-07-27 | Discharge: 2021-07-27 | Disposition: A | Discharge status: Home / Self Care | Payer: Commercial Managed Care - POS | Source: Ambulatory Visit | Attending: Chiropractor | Admitting: Chiropractor

## 2021-07-27 DIAGNOSIS — M961 Postlaminectomy syndrome, not elsewhere classified: Secondary | ICD-10-CM

## 2021-07-27 DIAGNOSIS — M47816 Spondylosis without myelopathy or radiculopathy, lumbar region: Secondary | ICD-10-CM | POA: Insufficient documentation

## 2021-07-27 MED ORDER — GADOBUTROL 1 MMOL/ML IV SOSY (WRAP)
7.00 mL | Freq: Once | INTRAVENOUS | Status: AC | PRN
Start: 2021-07-27 — End: 2021-07-27
  Administered 2021-07-27: 7 mL via INTRAVENOUS
  Filled 2021-07-27: qty 7.5

## 2021-08-04 ENCOUNTER — Encounter (HOSPITAL_BASED_OUTPATIENT_CLINIC_OR_DEPARTMENT_OTHER): Payer: Self-pay | Admitting: Orthopaedic Surgery

## 2021-09-23 ENCOUNTER — Other Ambulatory Visit (INDEPENDENT_AMBULATORY_CARE_PROVIDER_SITE_OTHER): Payer: Self-pay | Admitting: Chiropractor

## 2021-11-23 ENCOUNTER — Other Ambulatory Visit: Payer: Self-pay | Admitting: Physician Assistant

## 2021-12-30 ENCOUNTER — Encounter (HOSPITAL_BASED_OUTPATIENT_CLINIC_OR_DEPARTMENT_OTHER): Payer: Self-pay | Admitting: Orthopaedic Surgery

## 2022-01-04 ENCOUNTER — Other Ambulatory Visit (INDEPENDENT_AMBULATORY_CARE_PROVIDER_SITE_OTHER): Payer: Self-pay | Admitting: Physician Assistant

## 2022-02-03 ENCOUNTER — Other Ambulatory Visit: Payer: Self-pay | Admitting: General Acute Care Hospital

## 2022-02-03 ENCOUNTER — Ambulatory Visit
Admission: RE | Admit: 2022-02-03 | Discharge: 2022-02-03 | Disposition: A | Discharge status: Home / Self Care | Payer: Commercial Managed Care - POS | Source: Ambulatory Visit | Attending: General Acute Care Hospital | Admitting: General Acute Care Hospital

## 2022-02-03 DIAGNOSIS — Z1231 Encounter for screening mammogram for malignant neoplasm of breast: Secondary | ICD-10-CM

## 2022-07-09 IMAGING — DX HIP BILATERAL WITH PELVIS 5 VIEWS
1 series · 5 of 5 positions shown · non-contrast
Comparison: None.

________________________________________________________________________________________________ 
HIP BILATERAL WITH PELVIS 5 VIEWS, 07/09/2022 [DATE]: 
CLINICAL INDICATION: Pain in unspecified hip.

[Series 1: AP · U · 0.14mm/px · 5 of 5 slices shown]
[im 1/5]
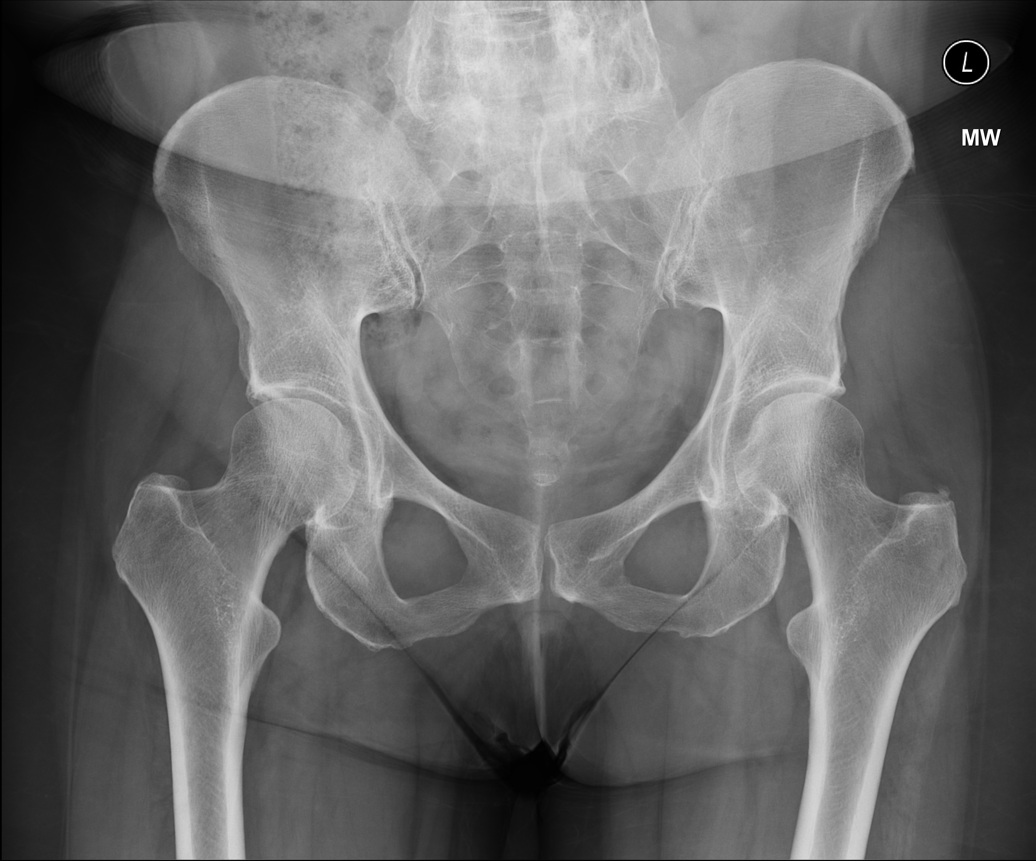
[im 2/5]
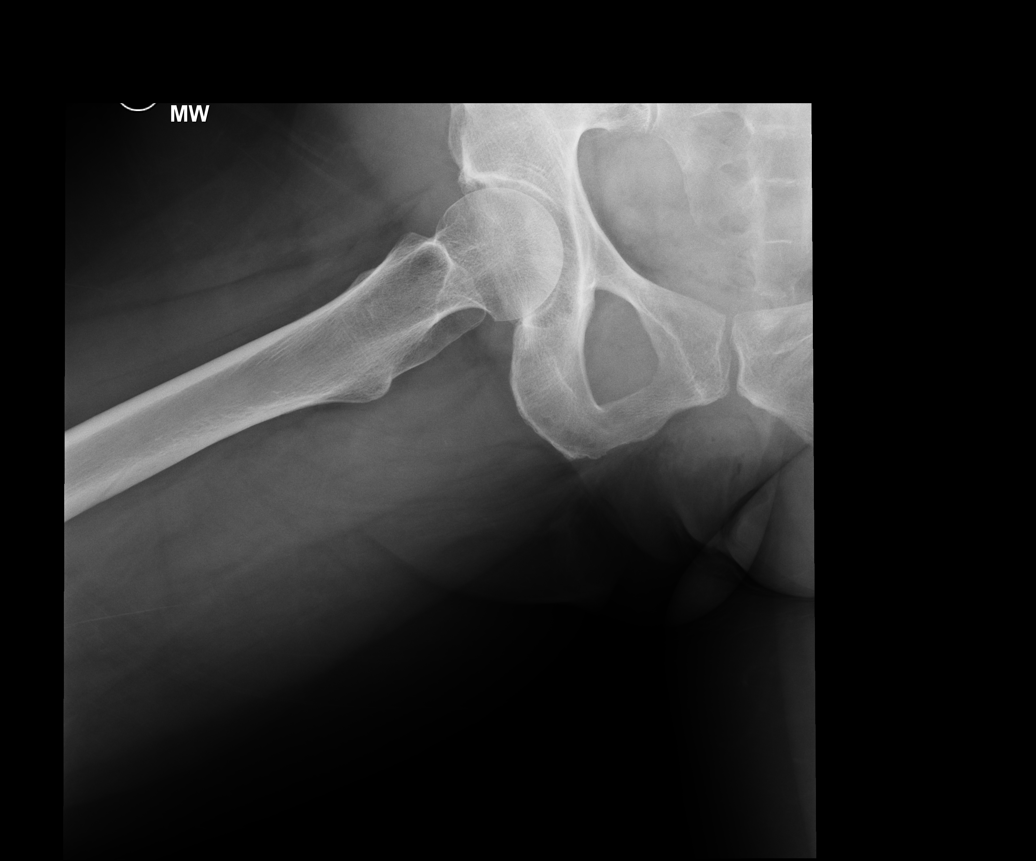
[im 3/5]
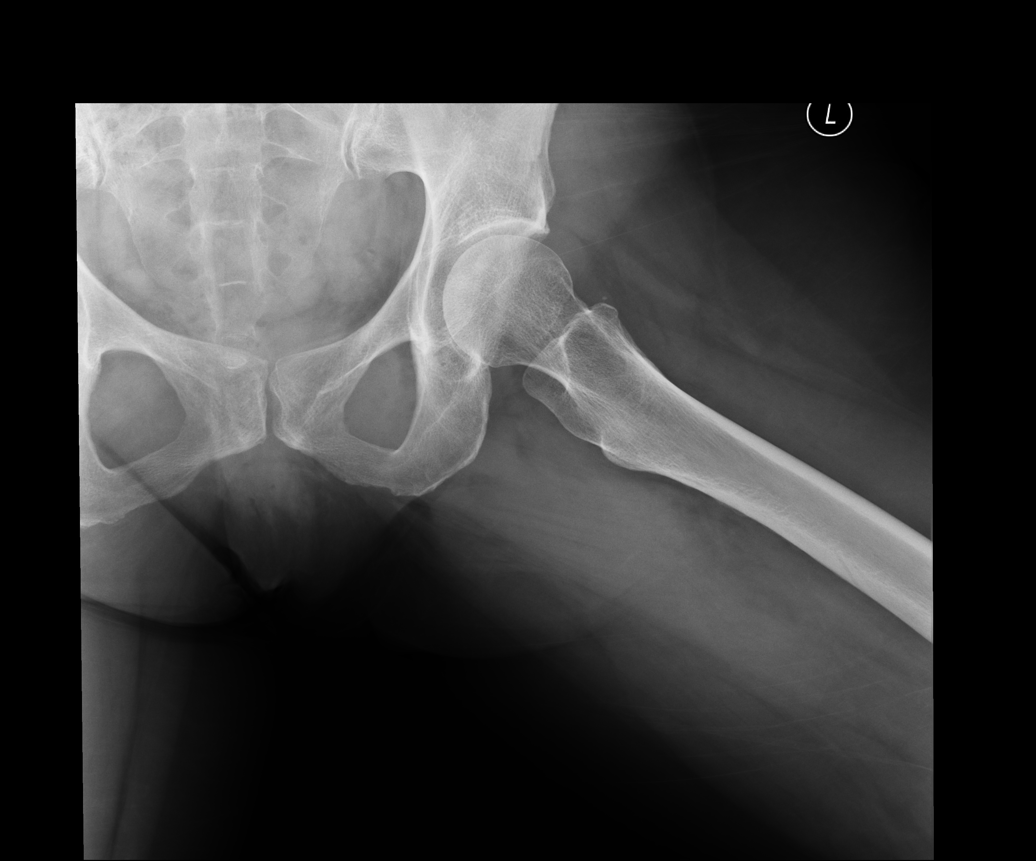
[im 4/5]
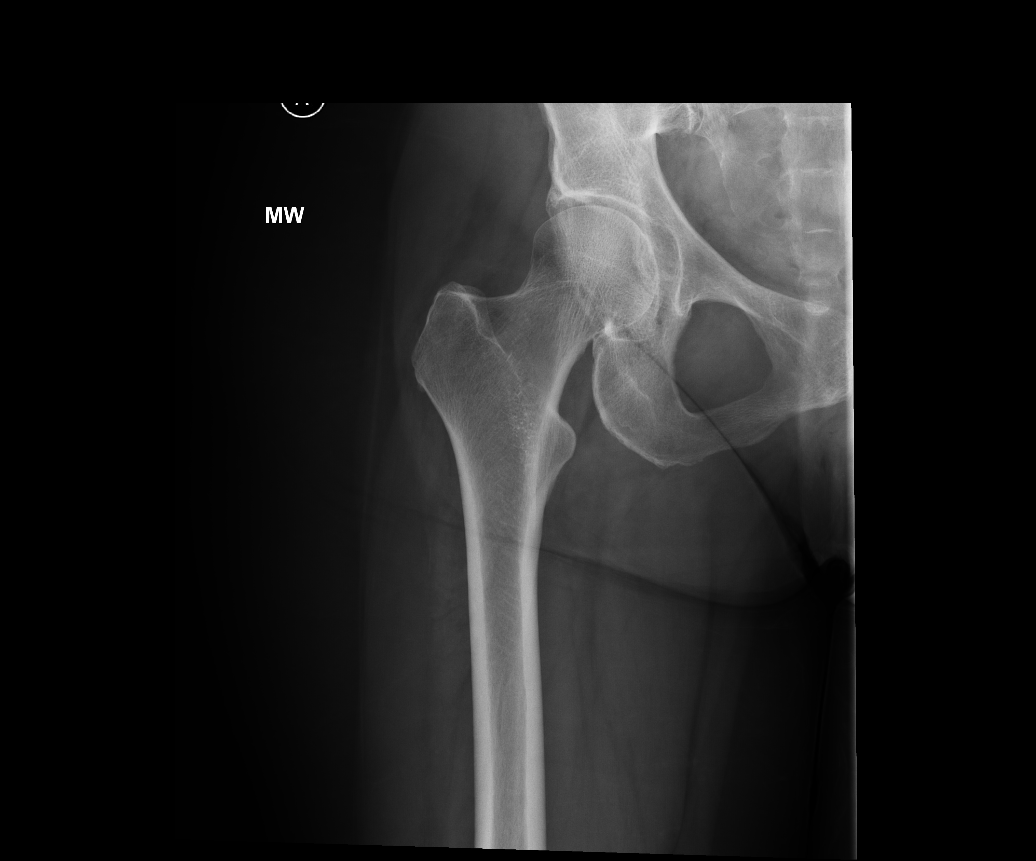
[im 5/5]
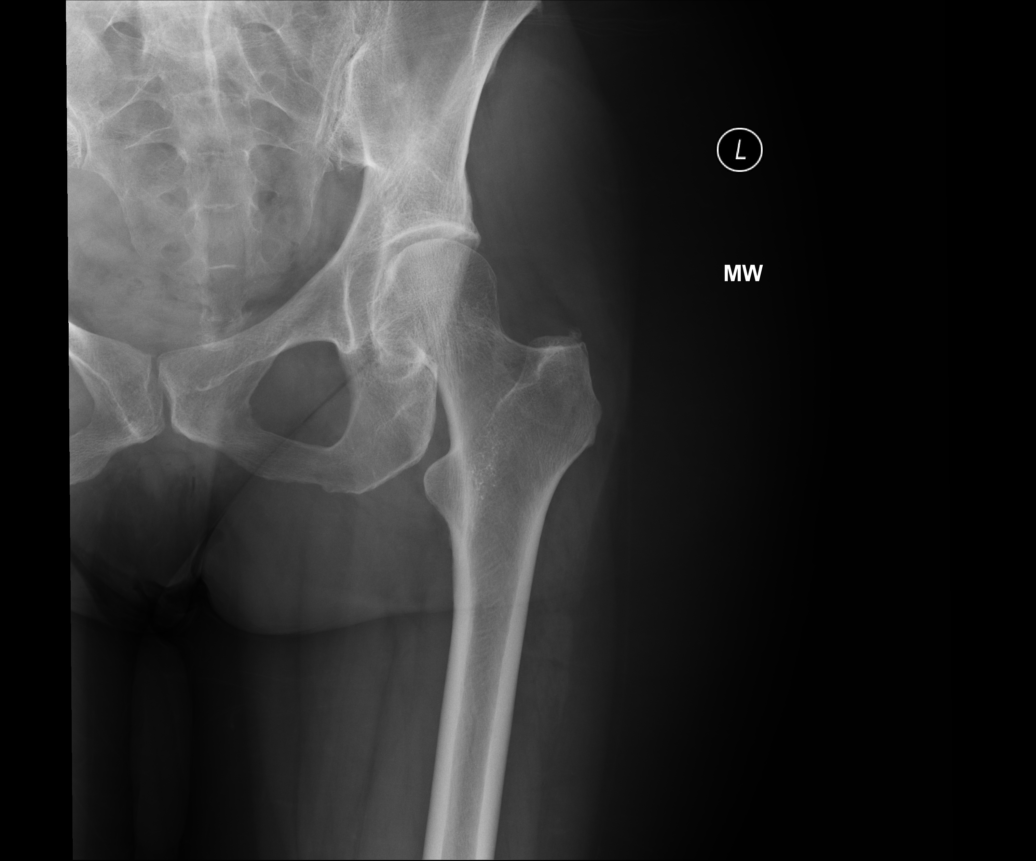

[5 of 5 positions shown; findings below may reference images not displayed]

FINDINGS: No fracture. Normal alignment. Hip joint spaces are preserved. 
Degenerative change of the SI joints. Lumbosacral fusion. No focal soft tissue 
swelling.
IMPRESSION: 1.  Hip joint spaces are preserved.  
2.  Degenerative change of the SI joints and lumbosacral fusion.

## 2022-07-25 IMAGING — MR MRI RIGHT HIP WITHOUT CONTRAST
4 of 6 series · 15 of 40 positions shown · IV contrast (gadolinium)
Comparison: 07/09/2022 radiographs

________________________________________________________________________________________________ 
MRI LEFT HIP WITHOUT CONTRAST, MRI RIGHT HIP WITHOUT CONTRAST, 07/25/2022 [DATE]: 
CLINICAL INDICATION: Pain in hips.
TECHNIQUE: Multiplanar, multiecho position MR images of the hips and pelvis were 
performed without intravenous gadolinium enhancement. Small field-of-view 
imaging was performed of the hip.

[Series 201: T1 · axial · 5.0mm · 0.56mm/px · z∈[-55,+159]mm · 3 of 44 slices shown]
[im 5/44]
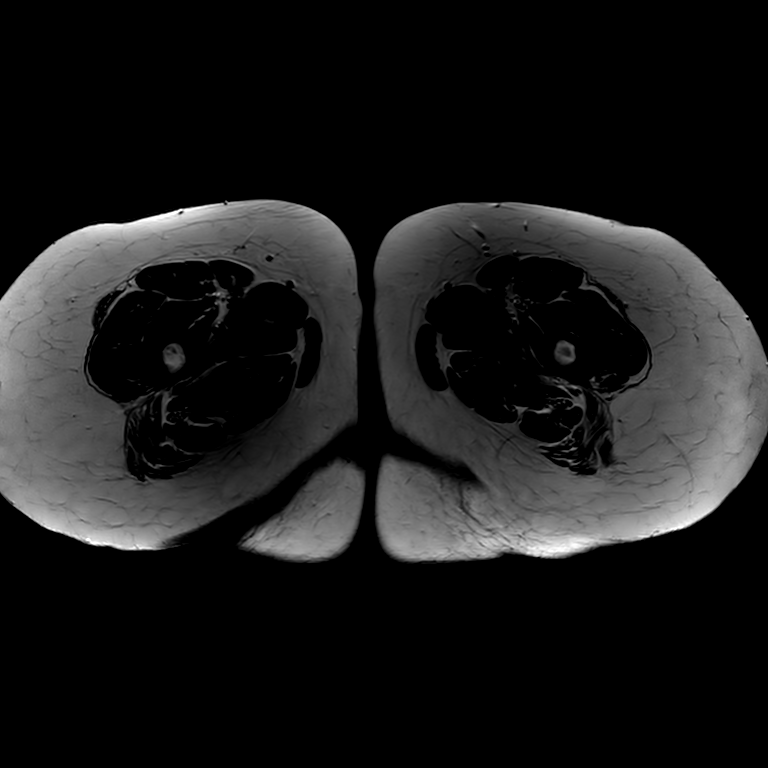
[im 24/44]
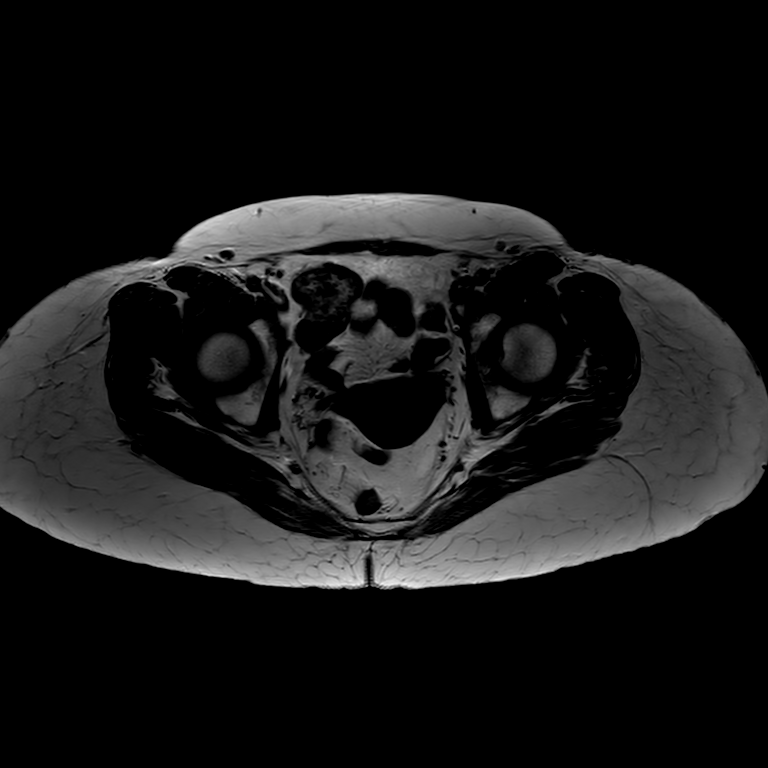
[im 39/44]
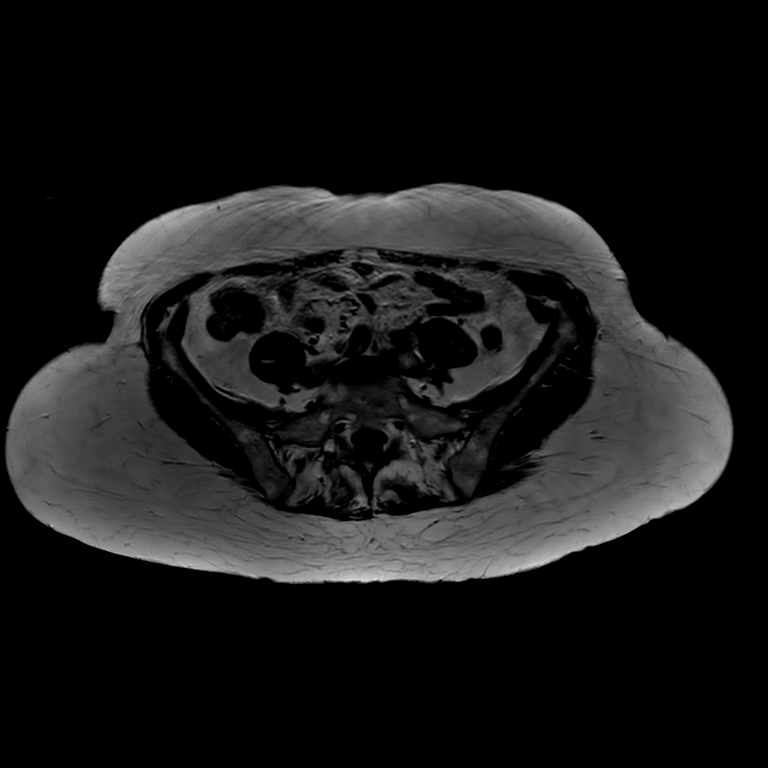

[Series 401: PD fat-sat · axial · 4.0mm · 0.35mm/px · z∈[-37,+78]mm · 6 of 32 slices shown (1 of 2)]
[im 1/32]
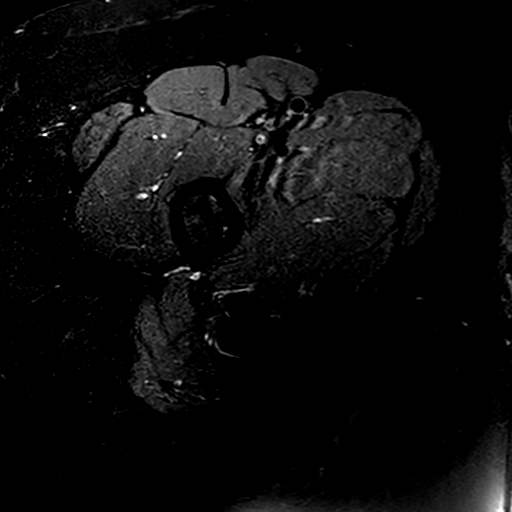
[im 6/32]
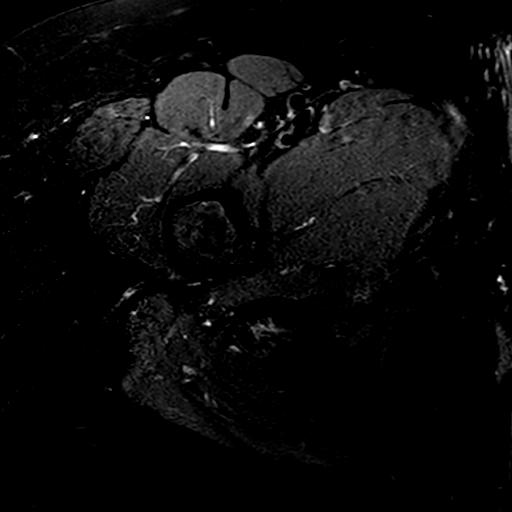
[im 11/32]
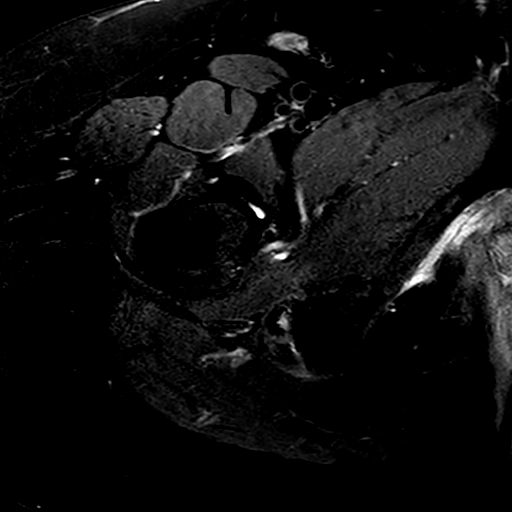
[im 16/32]
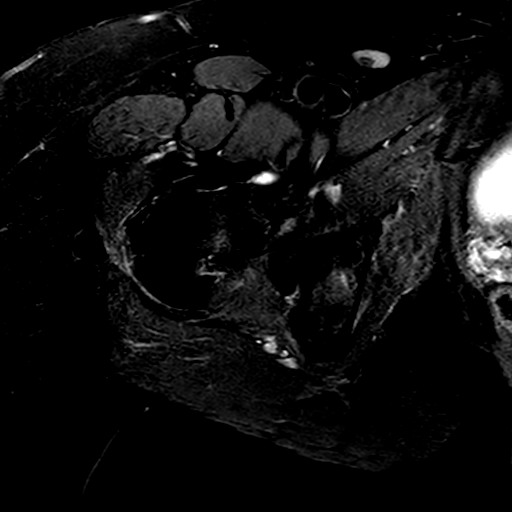
[im 21/32]
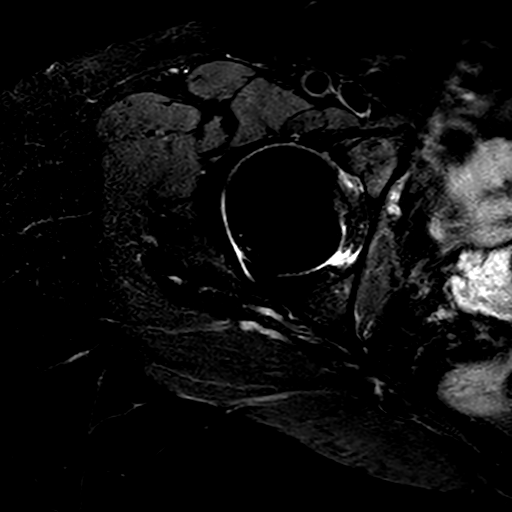
[im 26/32]
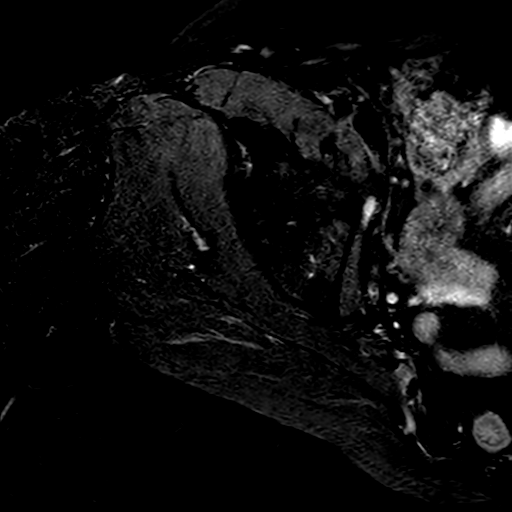

[Series 501: PD · sagittal · 3.5mm · 0.43mm/px · 3 of 36 slices shown]
[im 6/36]
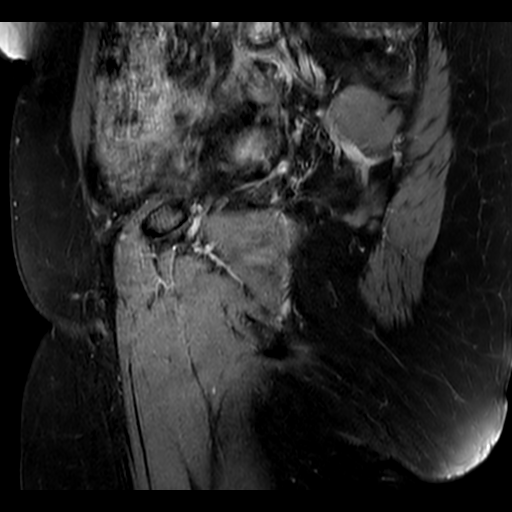
[im 21/36]
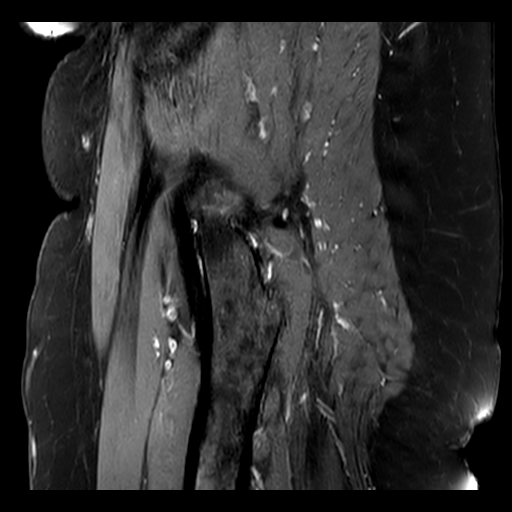
[im 31/36]
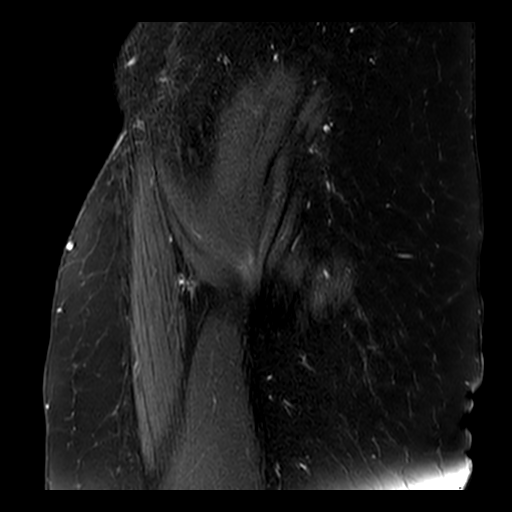

[Series 601: PD fat-sat · coronal · 3.2mm · 0.43mm/px · 3 of 20 slices shown (2 of 2)]
[im 1/20]
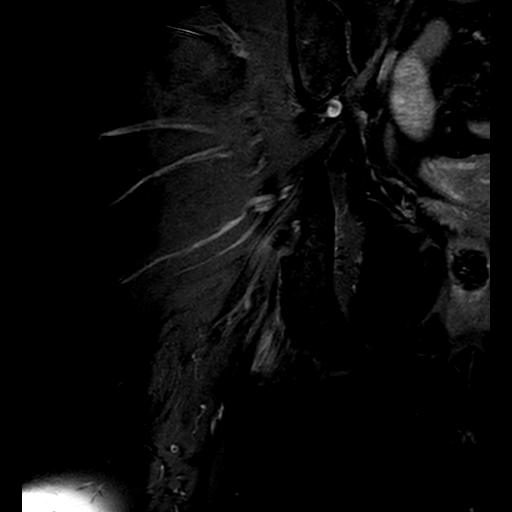
[im 10/20]
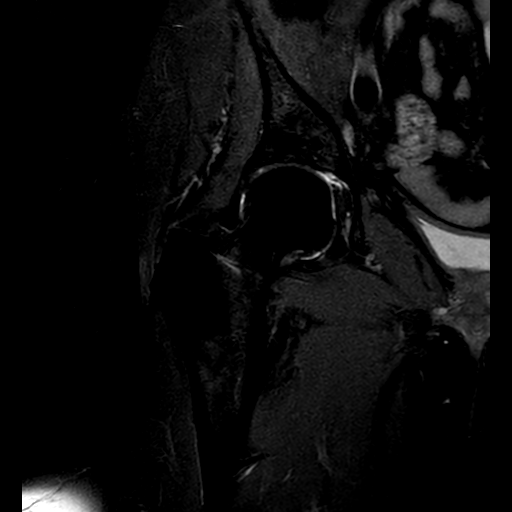
[im 20/20]
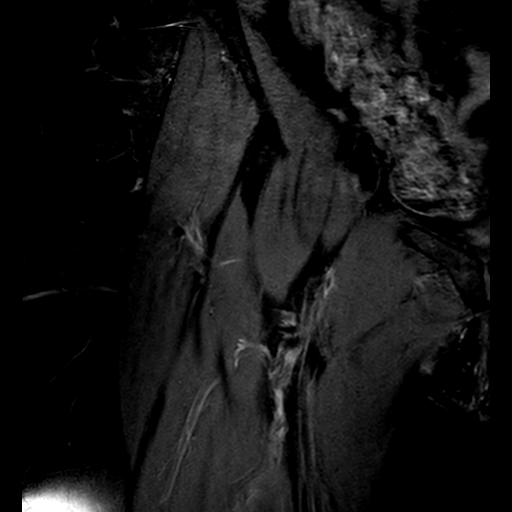

[15 of 40 positions shown; findings below may reference images not displayed]

FINDINGS: HIPS: Mild partial thickness right superolateral hip joint chondromalacia. No 
discrete articular cartilaginous loss of the left hip. Degenerative labral 
tears. No paralabral cyst. No hip joint effusion. 0.6 cm right anterior femoral 
neck synovial herniation pit. Both femoral heads maintain a spherical 
configuration without evidence of avascular necrosis or subarticular collapse. 
No abnormal morphology of the proximal femurs or acetabulum to predispose to 
impingement. 
PELVIC BONES: Normal marrow signal intensity. No fracture, contusion or marrow 
replacing lesion.  
SI JOINTS: SI joints are preserved.  
PUBIC SYMPHYSIS: Preserved. 
SPINE: Postsurgical and degenerative change of the spine. 
SOFT TISSUES: Mild tendinosis of the bilateral distal gluteus minimus tendons 
with tendon thickening, intermediate signal and mild peritendinous edema. The 
abductor cuffs are otherwise preserved without high-grade interstitial tear. 
There is trace fluid overlying the greater trochanters without overt 
trochanteric bursitis. Hamstring origin tendinosis. The rectus 
abdominis-adductor aponeurotic complexes are intact. Inferior paraspinal muscle 
edema and fatty atrophy. Tiny fat-containing umbilical hernia. No mass, free 
fluid or adenopathy. The bowel, bladder and pelvic organs are unremarkable.
IMPRESSION: 1.  Mild right hip degenerative change and bilateral anterior labral tears.  
2.  Mild gluteal/hamstring tendinosis. 
3.  Postsurgical and degenerative change of the spine and paraspinal muscle 
edema/fatty atrophy. 
4.  Tiny fat-containing umbilical hernia.

## 2022-07-25 IMAGING — MR MRI LEFT HIP WITHOUT CONTRAST
4 of 6 series · 15 of 40 positions shown · IV contrast (gadolinium)
Comparison: 07/09/2022 radiographs

________________________________________________________________________________________________ 
MRI LEFT HIP WITHOUT CONTRAST, MRI RIGHT HIP WITHOUT CONTRAST, 07/25/2022 [DATE]: 
CLINICAL INDICATION: Pain in hips.
TECHNIQUE: Multiplanar, multiecho position MR images of the hips and pelvis were 
performed without intravenous gadolinium enhancement. Small field-of-view 
imaging was performed of the hip.

[Series 201: T1 · axial · 5.0mm · 0.56mm/px · z∈[-55,+159]mm · 3 of 44 slices shown]
[im 5/44]
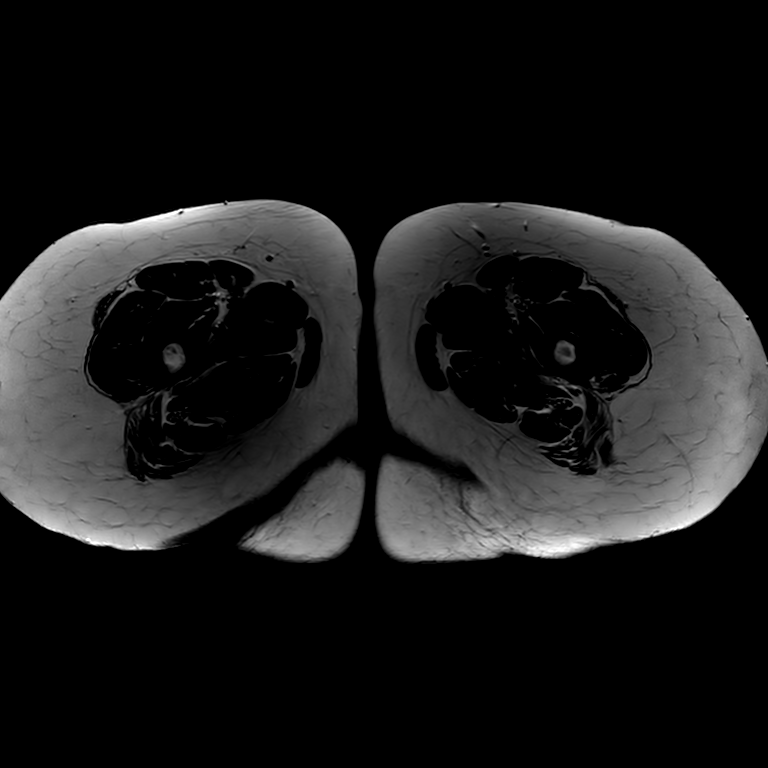
[im 24/44]
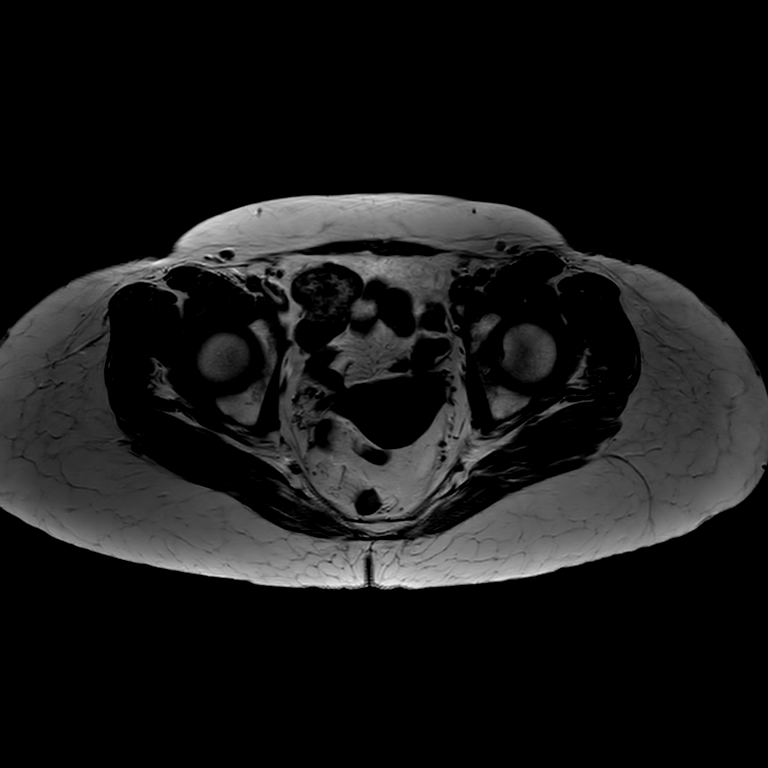
[im 39/44]
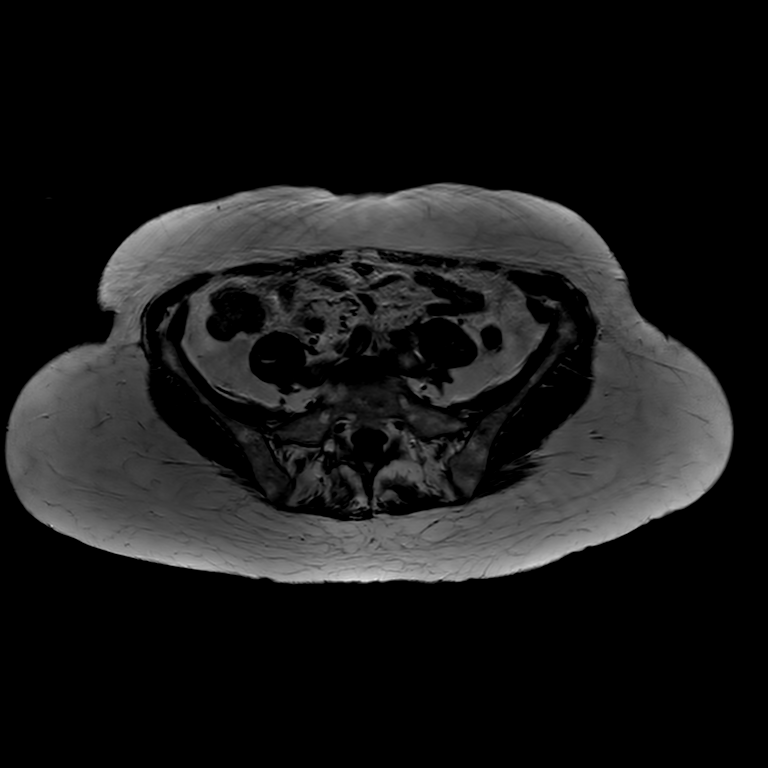

[Series 701: PD fat-sat · axial · 4.0mm · 0.35mm/px · z∈[-30,+85]mm · 6 of 32 slices shown (1 of 2)]
[im 1/32]
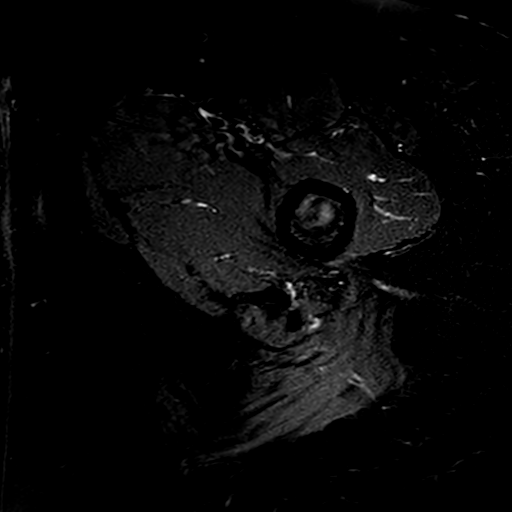
[im 6/32]
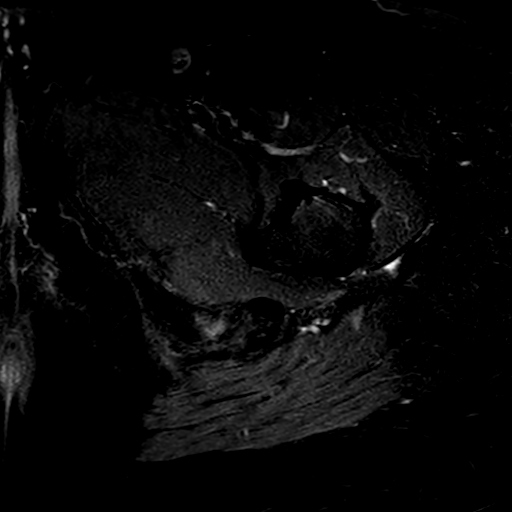
[im 11/32]
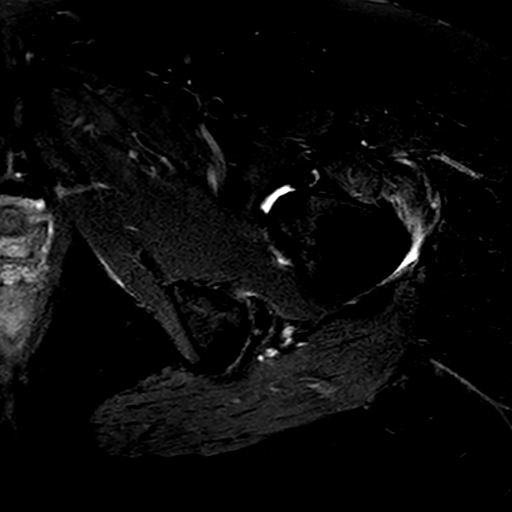
[im 16/32]
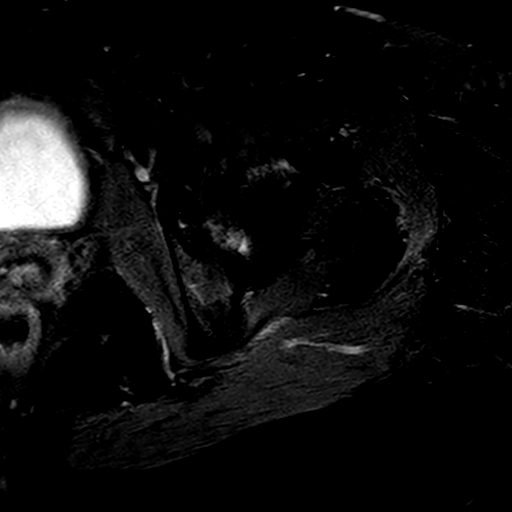
[im 21/32]
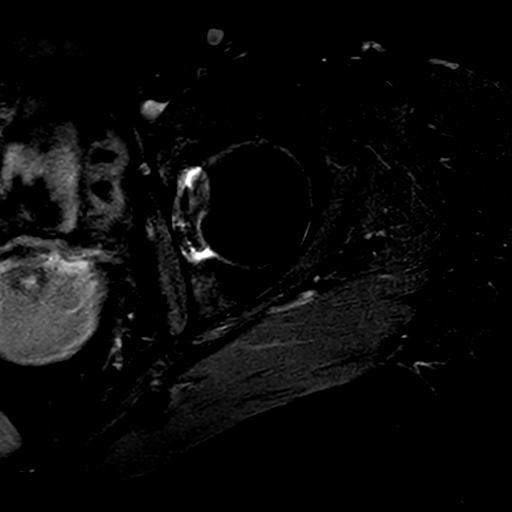
[im 26/32]
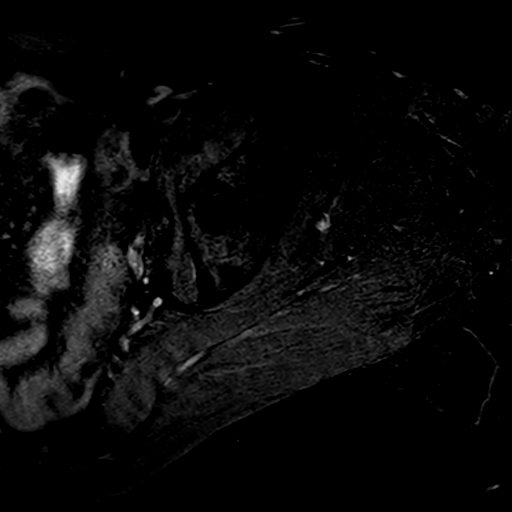

[Series 801: PD · sagittal · 3.5mm · 0.43mm/px · 3 of 36 slices shown]
[im 6/36]
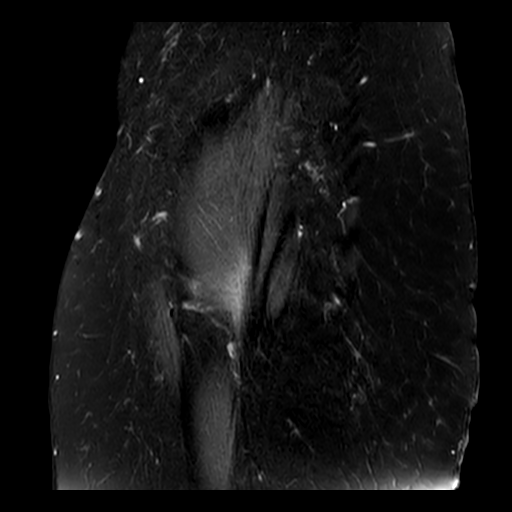
[im 21/36]
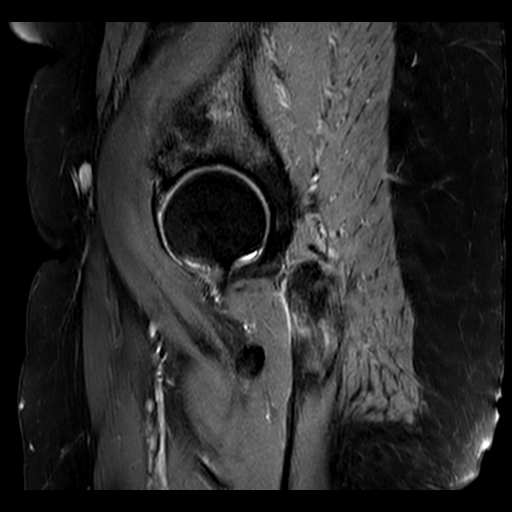
[im 31/36]
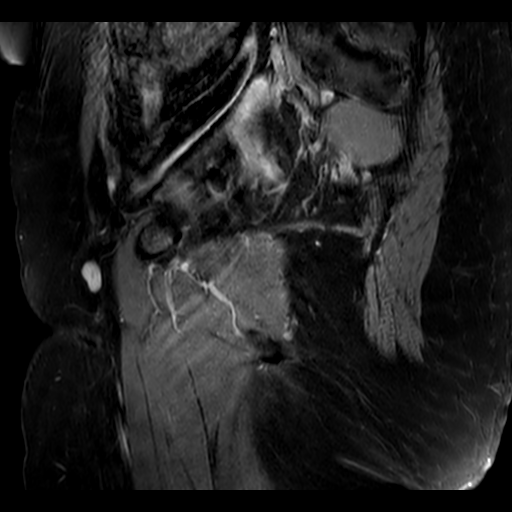

[Series 901: PD fat-sat · coronal · 3.2mm · 0.43mm/px · 3 of 20 slices shown (2 of 2)]
[im 1/20]
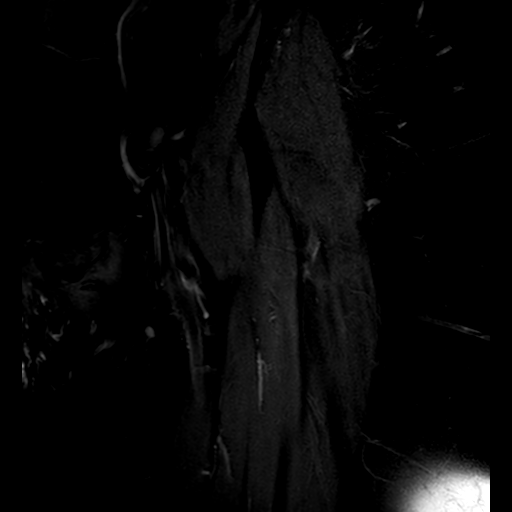
[im 10/20]
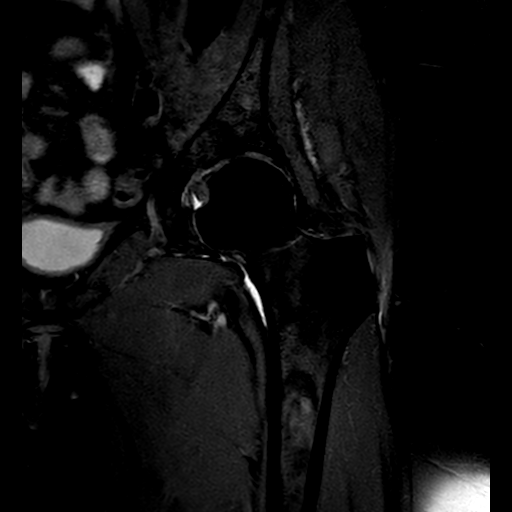
[im 20/20]
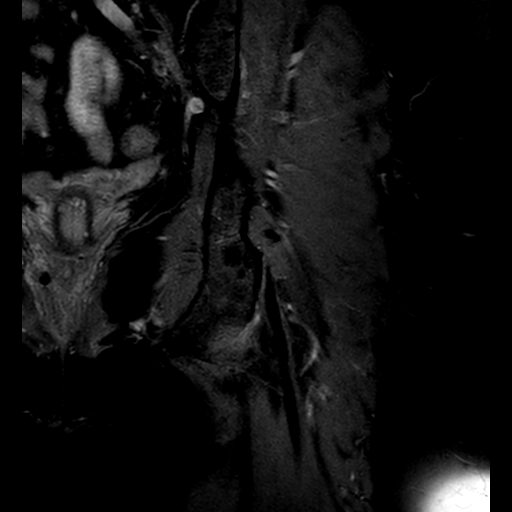

[15 of 40 positions shown; findings below may reference images not displayed]

FINDINGS: HIPS: Mild partial thickness right superolateral hip joint chondromalacia. No 
discrete articular cartilaginous loss of the left hip. Degenerative labral 
tears. No paralabral cyst. No hip joint effusion. 0.6 cm right anterior femoral 
neck synovial herniation pit. Both femoral heads maintain a spherical 
configuration without evidence of avascular necrosis or subarticular collapse. 
No abnormal morphology of the proximal femurs or acetabulum to predispose to 
impingement. 
PELVIC BONES: Normal marrow signal intensity. No fracture, contusion or marrow 
replacing lesion.  
SI JOINTS: SI joints are preserved.  
PUBIC SYMPHYSIS: Preserved. 
SPINE: Postsurgical and degenerative change of the spine. 
SOFT TISSUES: Mild tendinosis of the bilateral distal gluteus minimus tendons 
with tendon thickening, intermediate signal and mild peritendinous edema. The 
abductor cuffs are otherwise preserved without high-grade interstitial tear. 
There is trace fluid overlying the greater trochanters without overt 
trochanteric bursitis. Hamstring origin tendinosis. The rectus 
abdominis-adductor aponeurotic complexes are intact. Inferior paraspinal muscle 
edema and fatty atrophy. Tiny fat-containing umbilical hernia. No mass, free 
fluid or adenopathy. The bowel, bladder and pelvic organs are unremarkable.
IMPRESSION: 1.  Mild right hip degenerative change and bilateral anterior labral tears.  
2.  Mild gluteal/hamstring tendinosis. 
3.  Postsurgical and degenerative change of the spine and paraspinal muscle 
edema/fatty atrophy. 
4.  Tiny fat-containing umbilical hernia.

## 2022-08-19 IMAGING — DX LUMBAR SPINE 2 VIEW
1 series · 2 of 2 positions shown · non-contrast
Comparison: None

________________________________________________________________________________________________ 
LUMBAR SPINE 2 VIEW, 08/19/2022 [DATE]: 
CLINICAL INDICATION:  Low back pain

[Series 1: AP · U · 0.14mm/px · 2 of 2 slices shown]
[im 1/2]
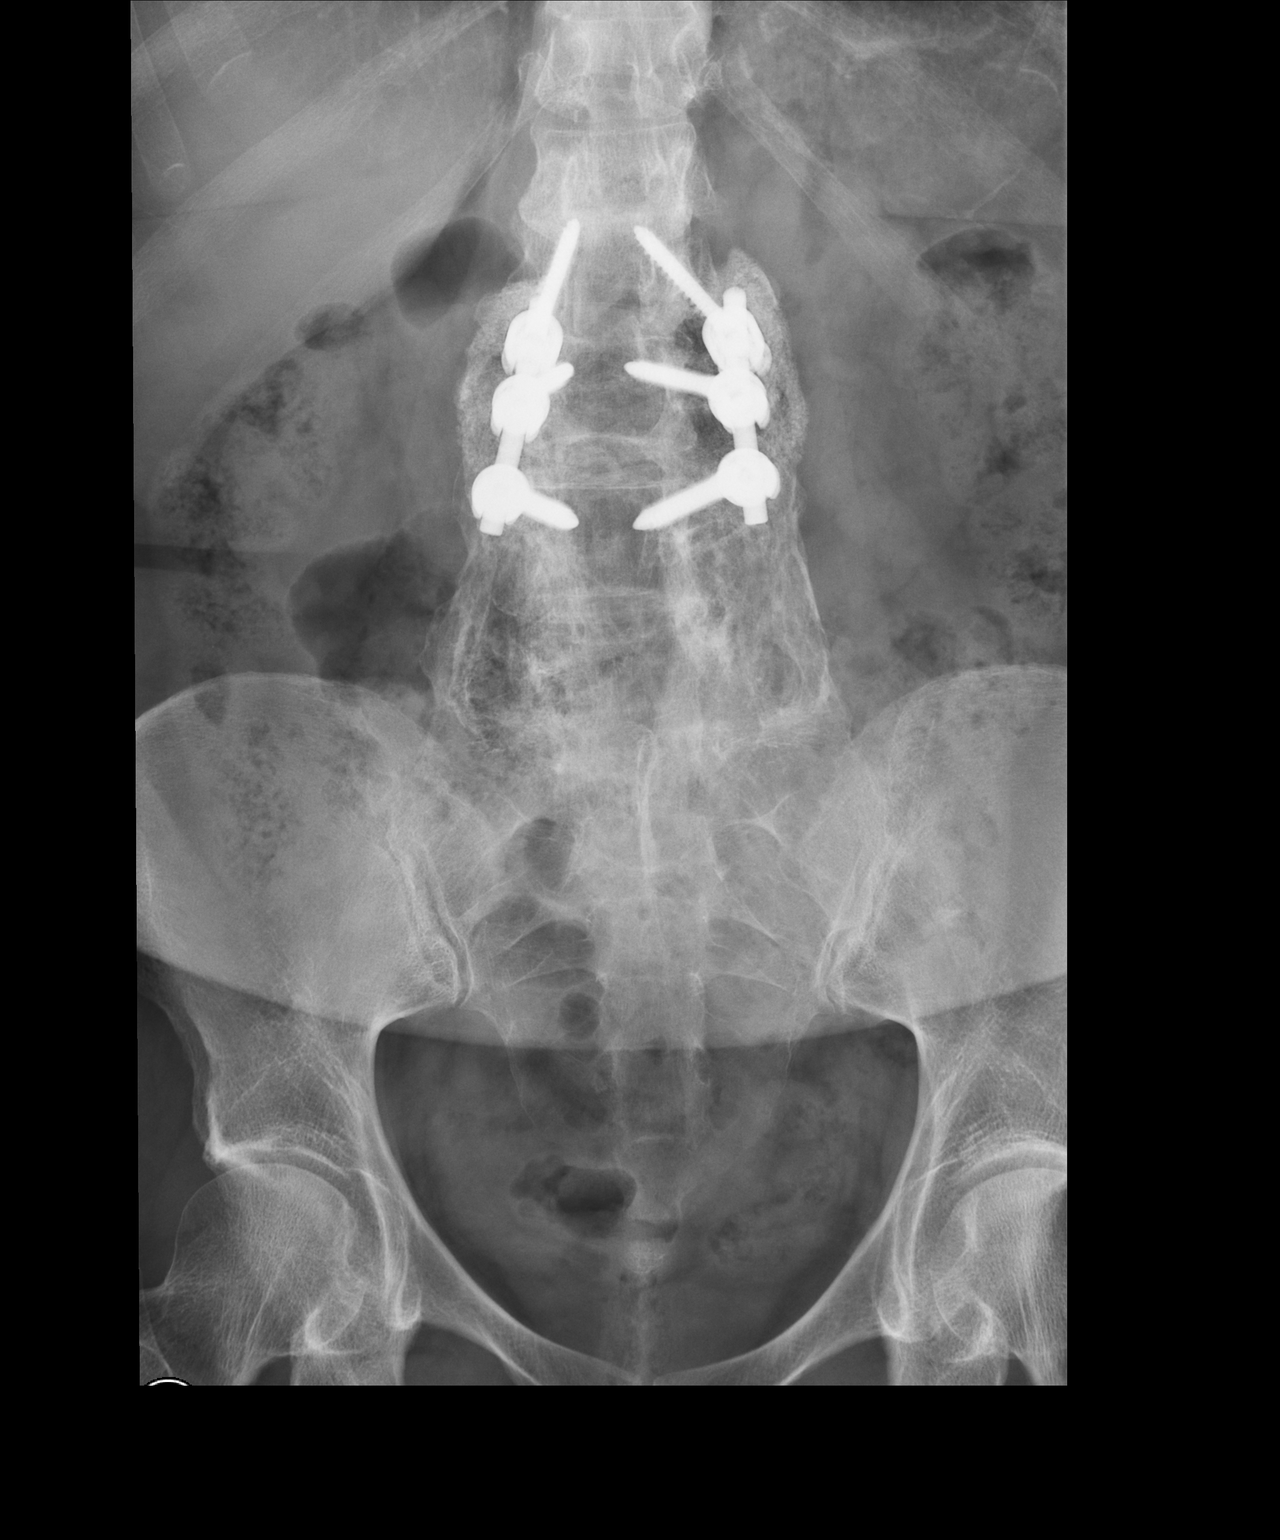
[im 2/2]
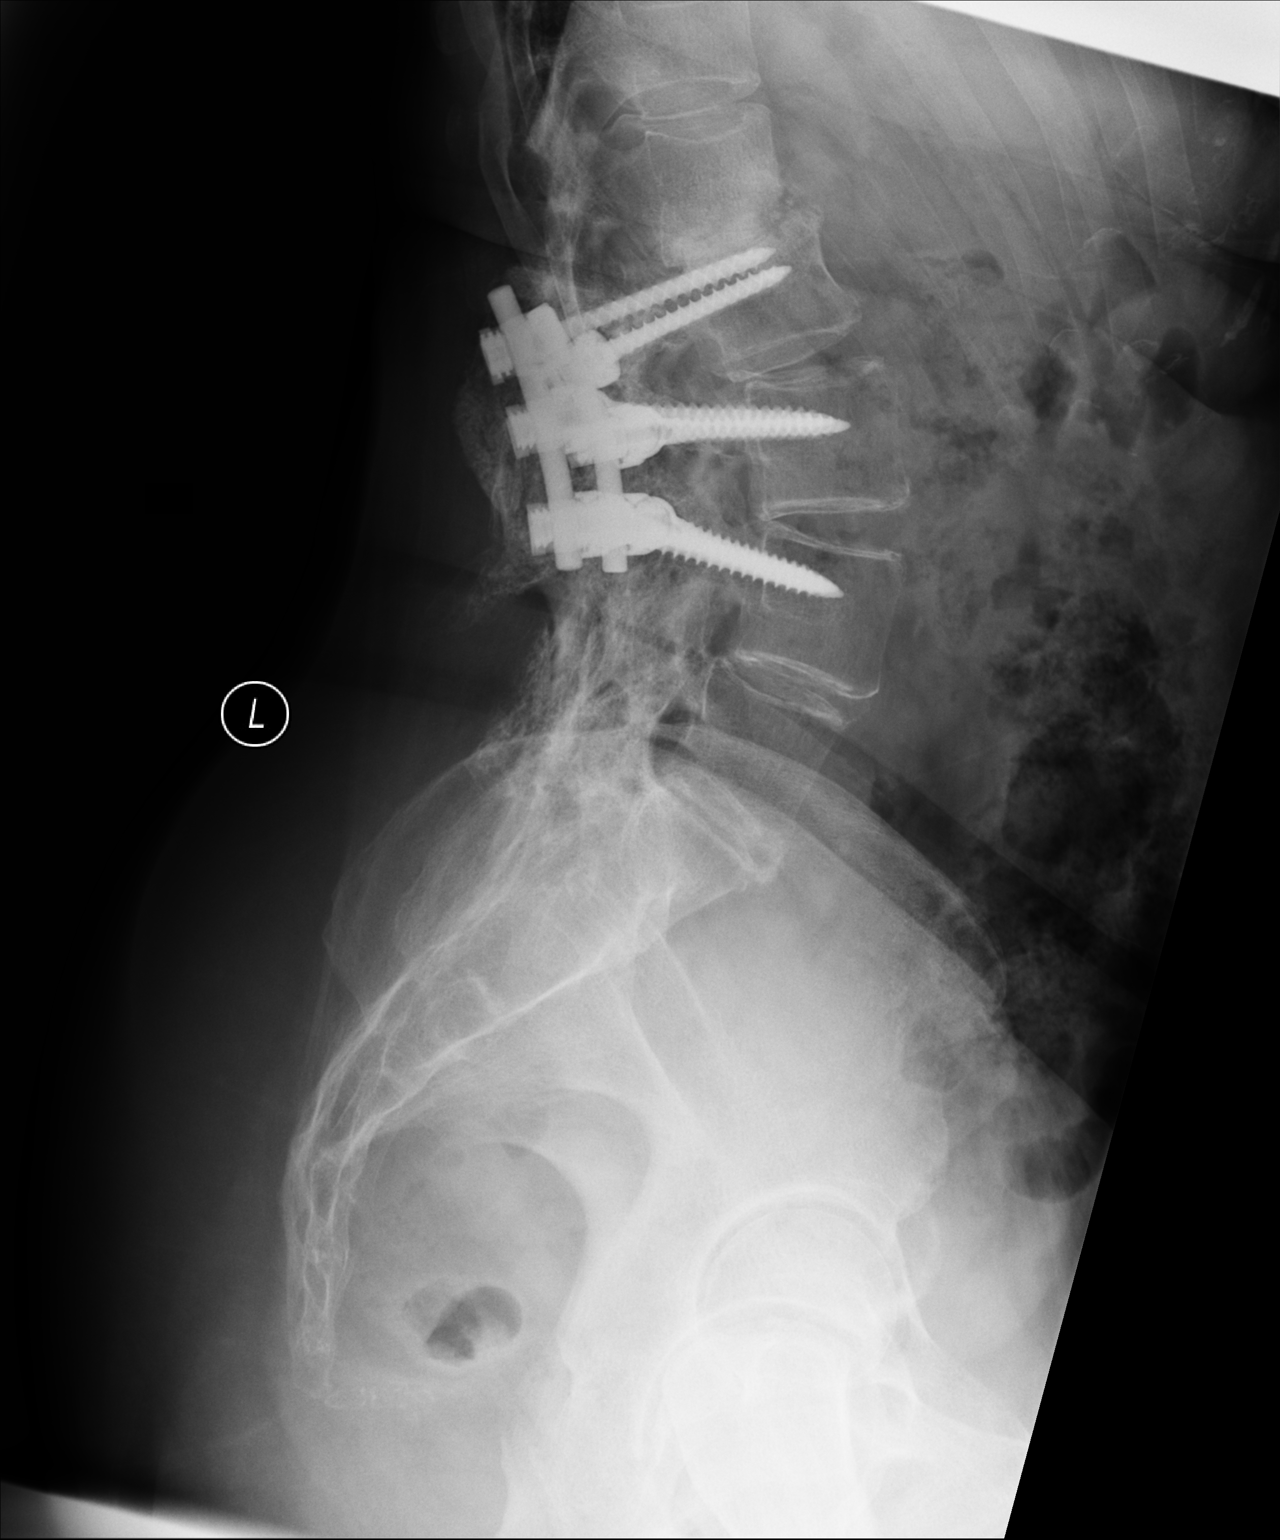

[2 of 2 positions shown; findings below may reference images not displayed]

FINDINGS: Lumbar vertebral heights are intact. Patient is status post L2-L4 
pedicle screw fusion with decompression. Extensive posterior element fusion bone 
appears solid. There is marked disc narrowing and less than grade 1 
retrolisthesis at L1-2. Mild loss of height along the upper L1 endplate is 
likely chronic. .
IMPRESSION: Postsurgical and degenerative changes as described. CT or MRI would be useful to 
evaluate for stenosis at L1-2.

## 2022-09-20 IMAGING — MR MRI LUMBAR SPINE WITHOUT CONTRAST
7 of 8 series · 17 of 48 positions shown · IV contrast (gadolinium)
Comparison: Lumbar radiograph August 19, 2022

________________________________________________________________________________________________ 
MRI LUMBAR SPINE WITHOUT CONTRAST, 09/20/2022 [DATE]: 
CLINICAL INDICATION: Chronic low back pain extending to the hips
TECHNIQUE: Multiplanar, multiecho position MR images of the lumbar spine were 
performed without intravenous gadolinium enhancement. Patient was scanned on a 
1.5T magnet

[Series 101: survey · axial · 10.0mm · 1.25mm/px · 1 of 10 slices shown]
[im 1/10]
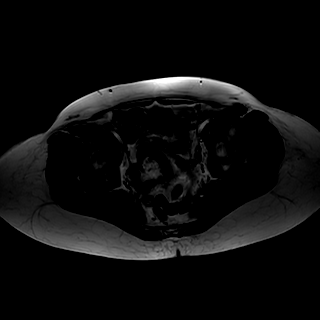

[Series 201: t2w_cor-surv · coronal · 6.0mm · 0.62mm/px · 1 of 14 slices shown]
[im 1/14]
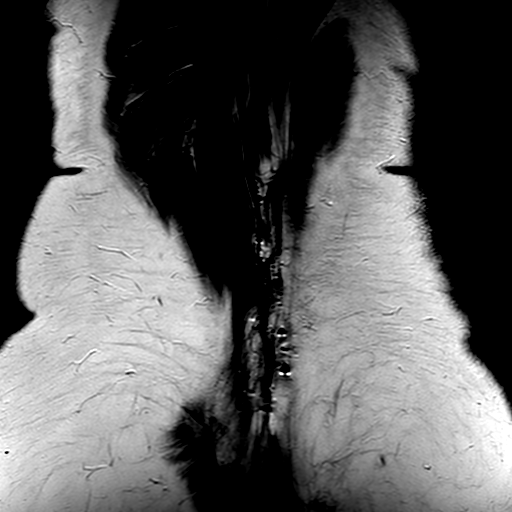

[Series 301: T1 · sagittal · 4.0mm · 0.48mm/px · 3 of 19 slices shown]
[im 1/19]
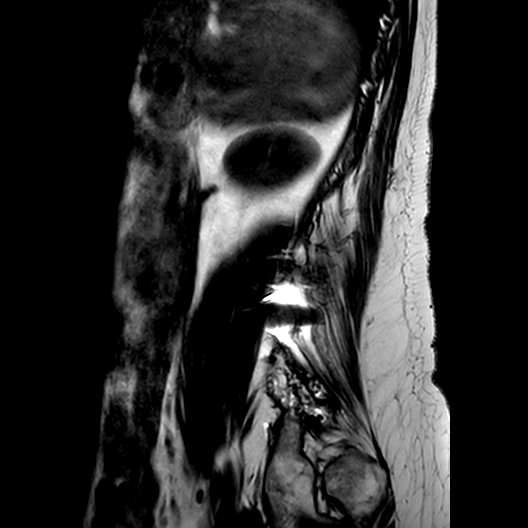
[im 10/19]
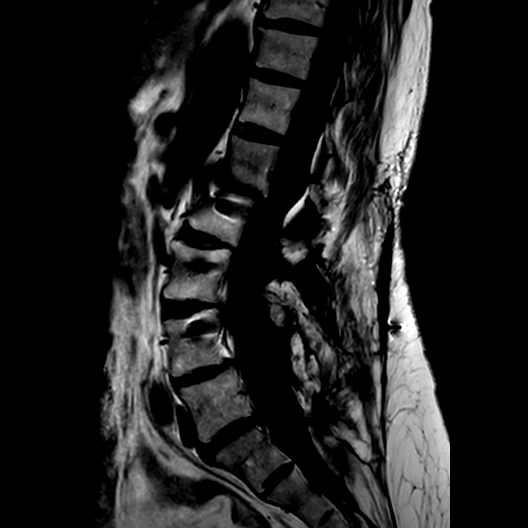
[im 19/19]
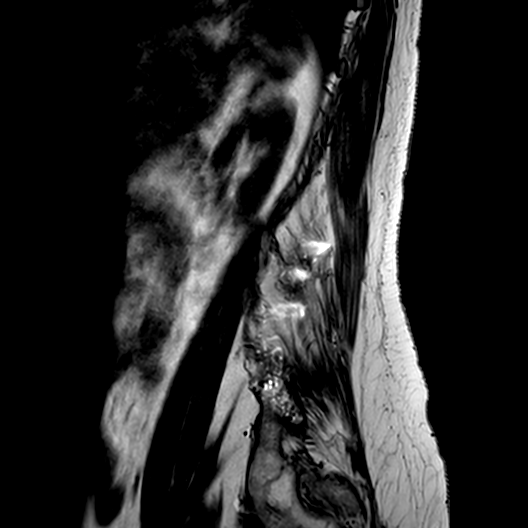

[Series 402: (id)_mdixon_tse · sagittal · 4.0mm · 0.50mm/px · 3 of 19 slices shown]
[im 1/19]
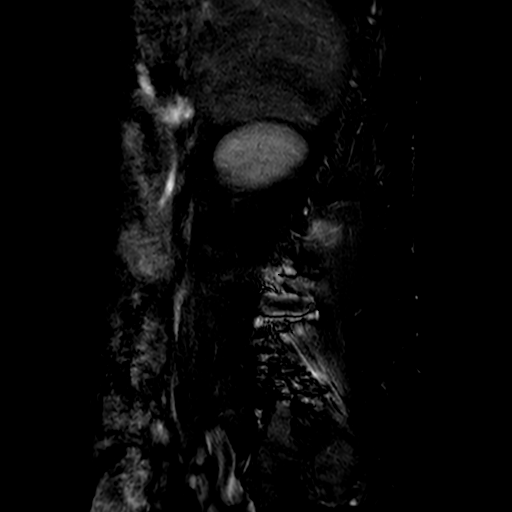
[im 10/19]
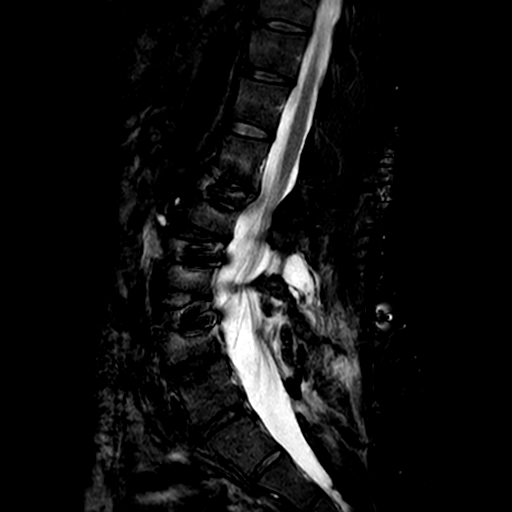
[im 19/19]
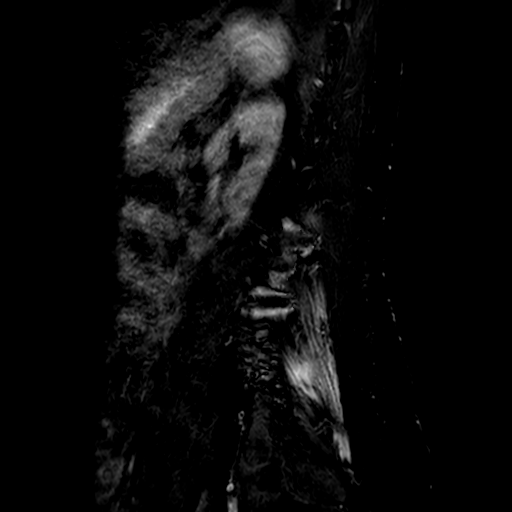

[Series 403: st2w_mdixon_tse · sagittal · 4.0mm · 0.50mm/px · 3 of 19 slices shown]
[im 1/19]
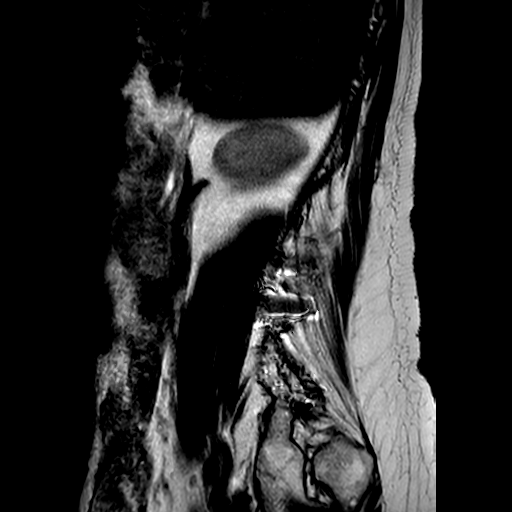
[im 10/19]
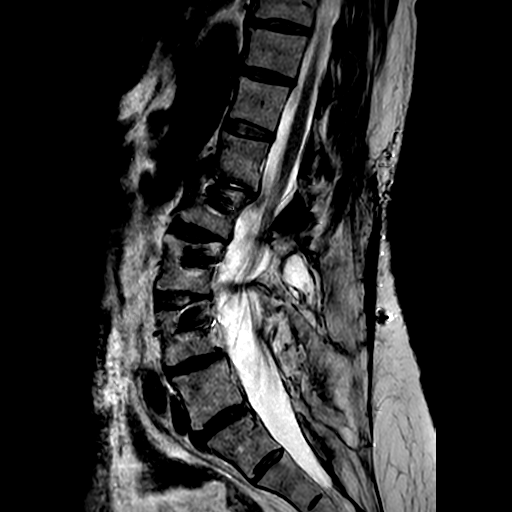
[im 19/19]
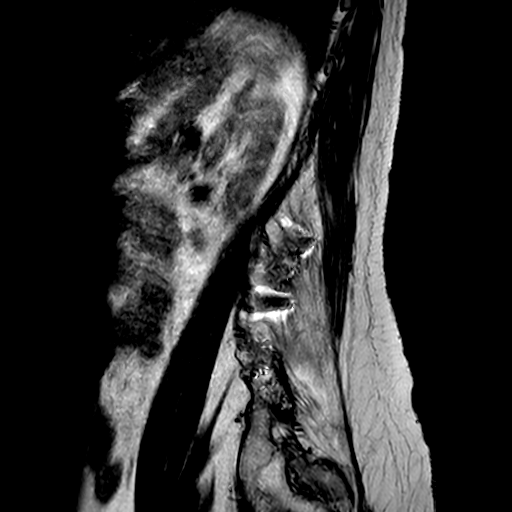

[Series 501: sag spineview ax · sagittal · 1.4mm · 0.36mm/px · 2 of 114 slices shown]
[im 1/114]
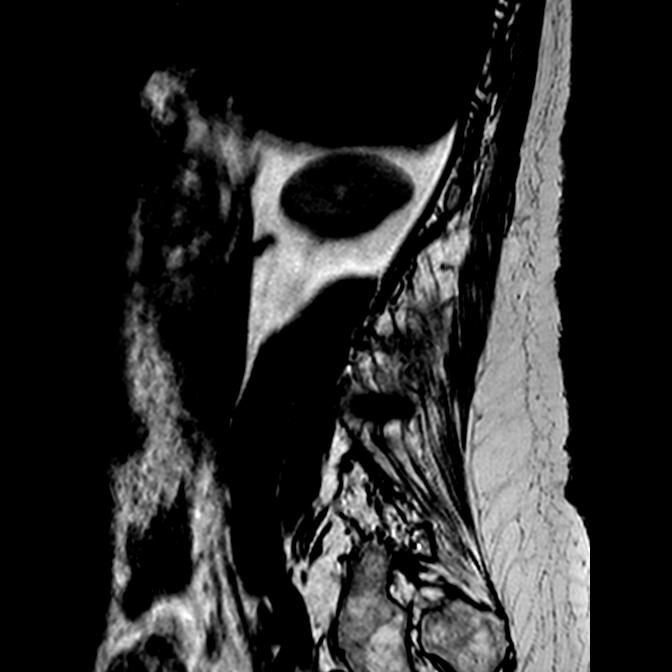
[im 17/114]
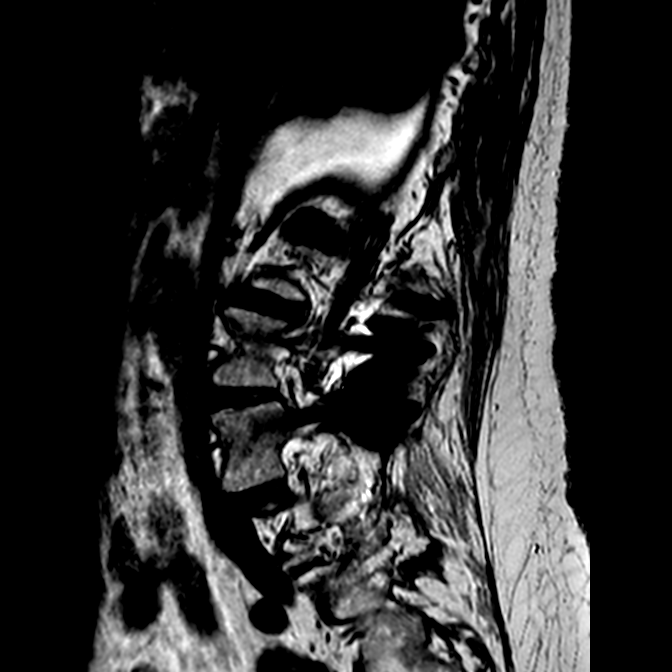

[Series 601: T2 · axial · 4.0mm · 0.30mm/px · z∈[+2,+225]mm · 4 of 30 slices shown]
[im 1/30]
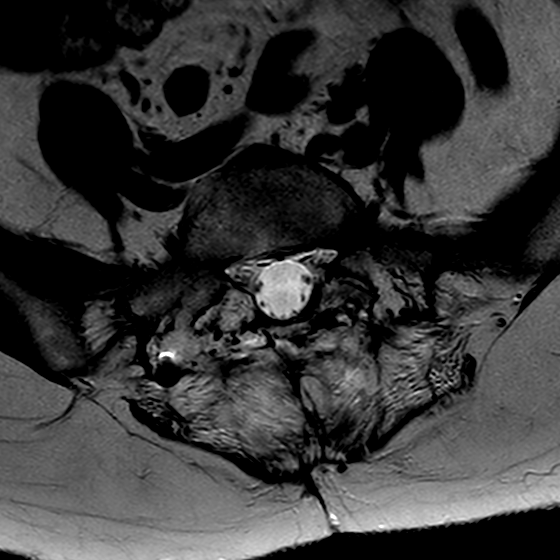
[im 10/30]
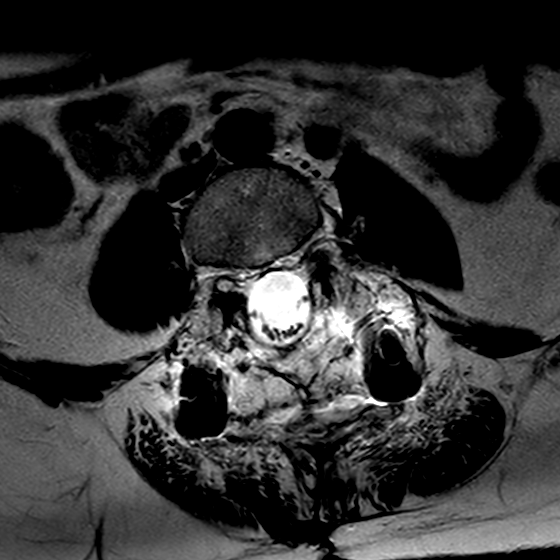
[im 20/30]
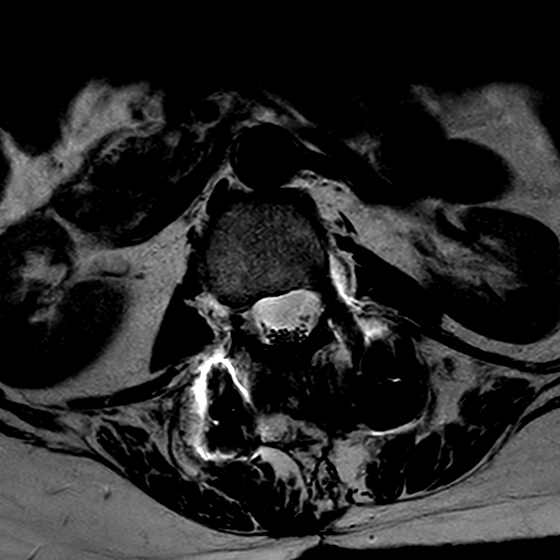
[im 30/30]
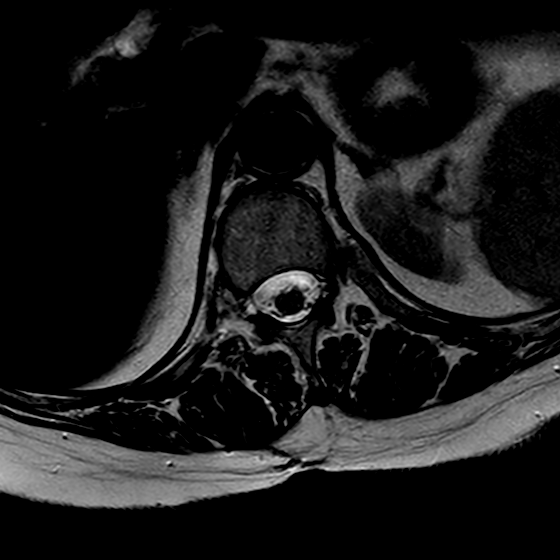

[17 of 48 positions shown; findings below may reference images not displayed]

FINDINGS: There is mild motion artifact limiting detail. 
Patient is status post L2-L4 pedicle screw fusion. Hardware appears 
appropriately positioned. 
At L5-S1 there has been decompression. The canal is open. Mild right foraminal 
narrowing. 
At L4-5 there is 2 mm anterolisthesis. There has been decompression. The canal 
and foramina are open. 
At L3-4 there has been decompression. The canal and foramina are open. 
At L2-3 there is been decompression. The canal and foramina are open. 
At L1-2 there is marked disc narrowing. There is moderate facet change with 
ligamentous thickening. There is no significant canal stenosis. There is mild 
narrowing of the upper L2 lateral recesses. Foramina are open. 
At T12-L1 the canal and foramina are open.
IMPRESSION: Degenerative and postsurgical changes as described. There is no significant 
canal stenosis. There is mild broad-based disc-osteophyte at L1-2, with mild 
encroachment on the upper L2 lateral recesses. 
No evidence for fracture, spinal infection, or malignancy. 
Mild lumbar dextroscoliosis.

## 2023-02-07 IMAGING — MG MAMMOGRAPHY SCREENING BILATERAL 3[PERSON_NAME]
8 series · 8 of 24 positions shown · non-contrast
Comparison: Comparison was made to prior examinations.

________________________________________________________________________________________________ 
MAMMOGRAPHY SCREENING BILATERAL 3EDISHER REJON, 02/07/2023 [DATE]: 
CLINICAL INDICATION: Encounter for screening mammogram.
TECHNIQUE: Digital bilateral mammograms and 3-D Tomosynthesis were obtained. 
These were interpreted both primarily and with the aid of computer-aided 
detection system.  
BREAST DENSITY: (Level C) The breasts are heterogeneously dense, which may 
obscure small masses.

[R MLO]
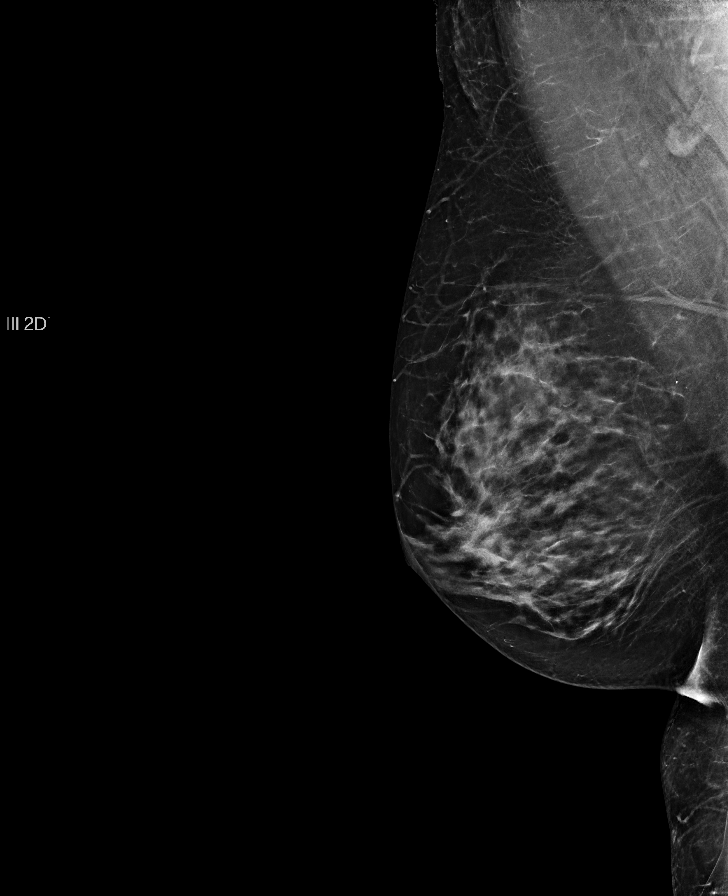

[L CC]
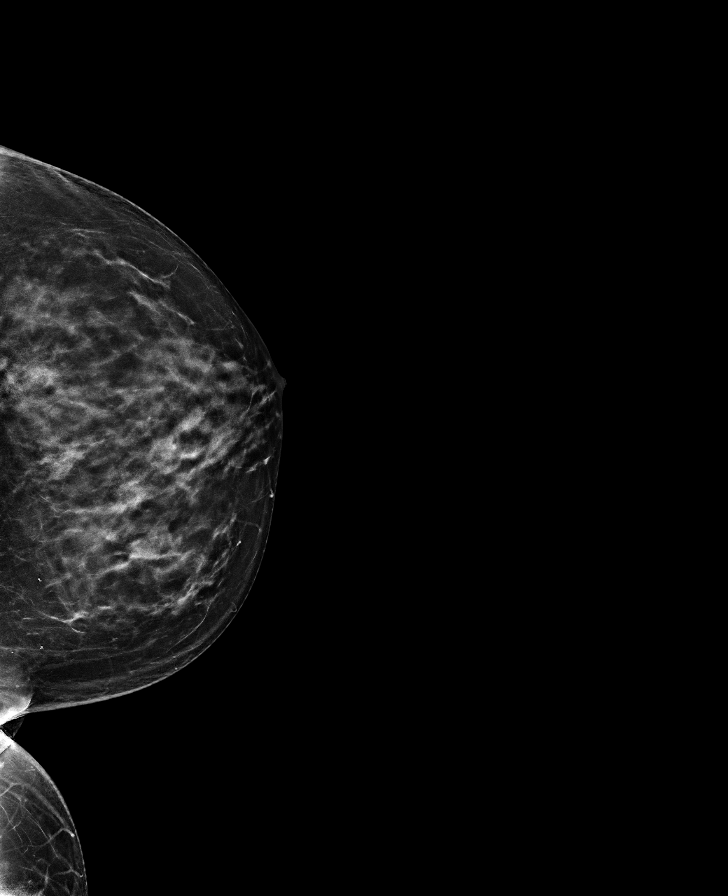

[R CC]
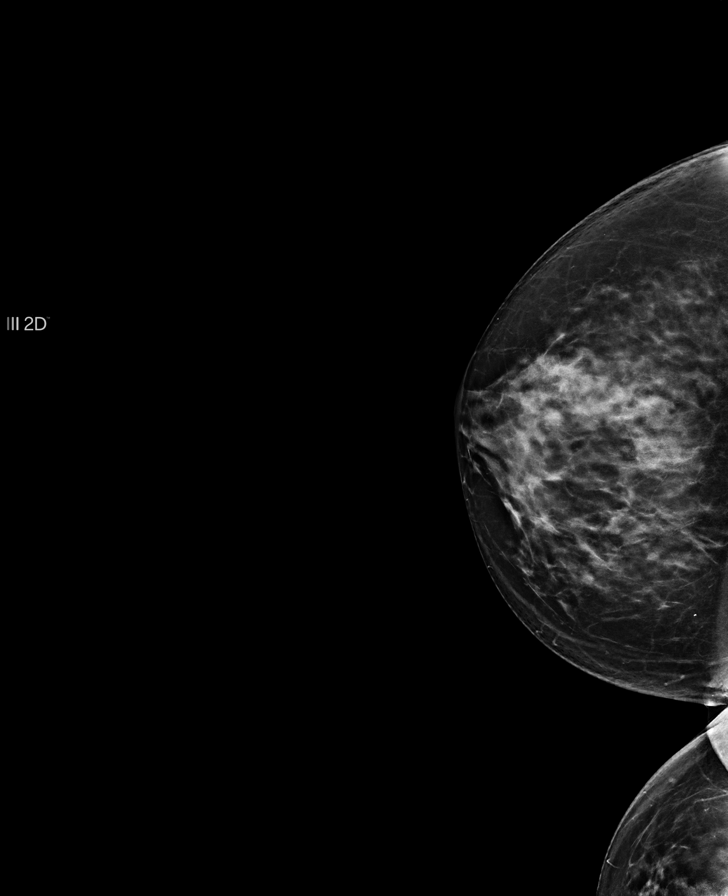

[L MLO]
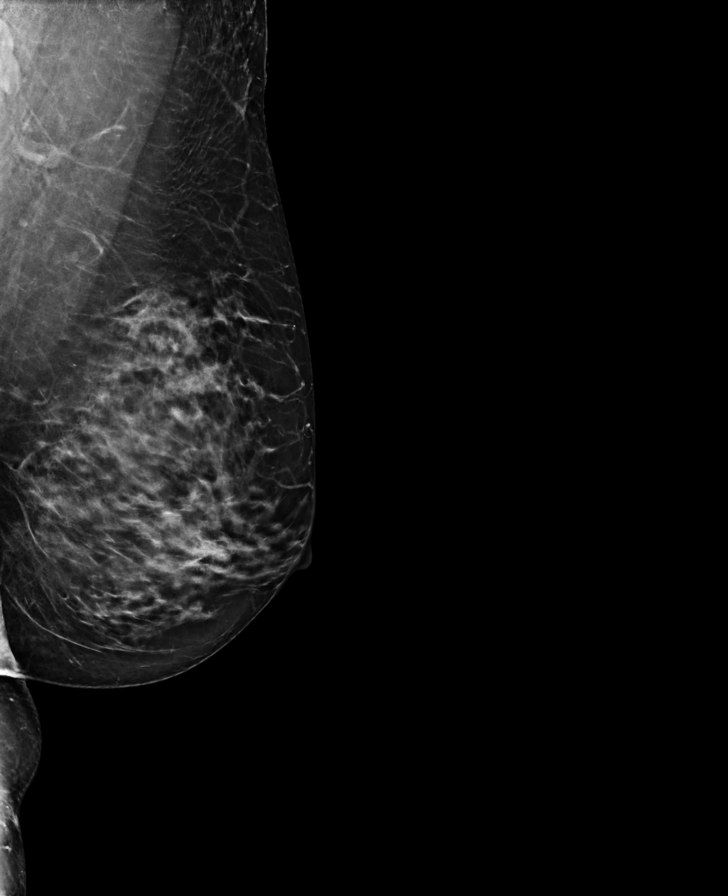

[L CC tomo · tomo slice 12/23.0]
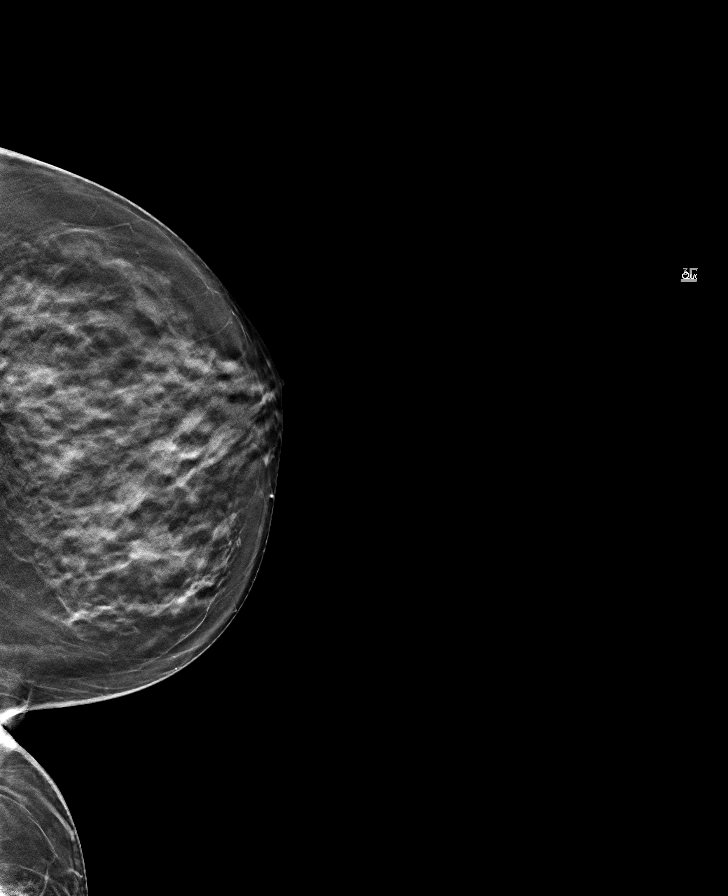

[R MLO tomo · tomo slice 14/27.0]
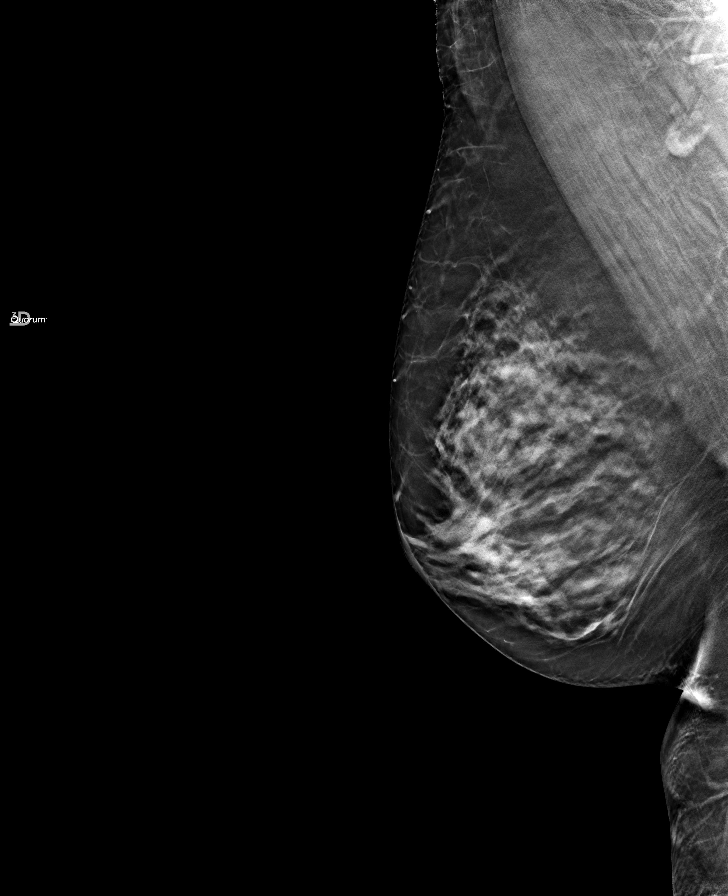

[R CC tomo · tomo slice 13/24.0]
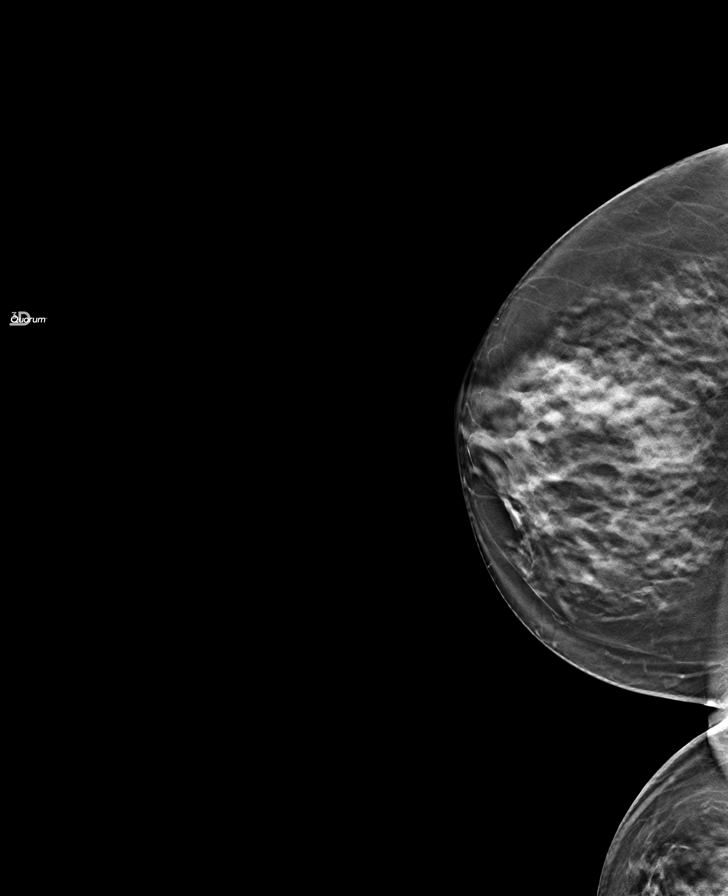

[L MLO tomo · tomo slice 15/28.0]
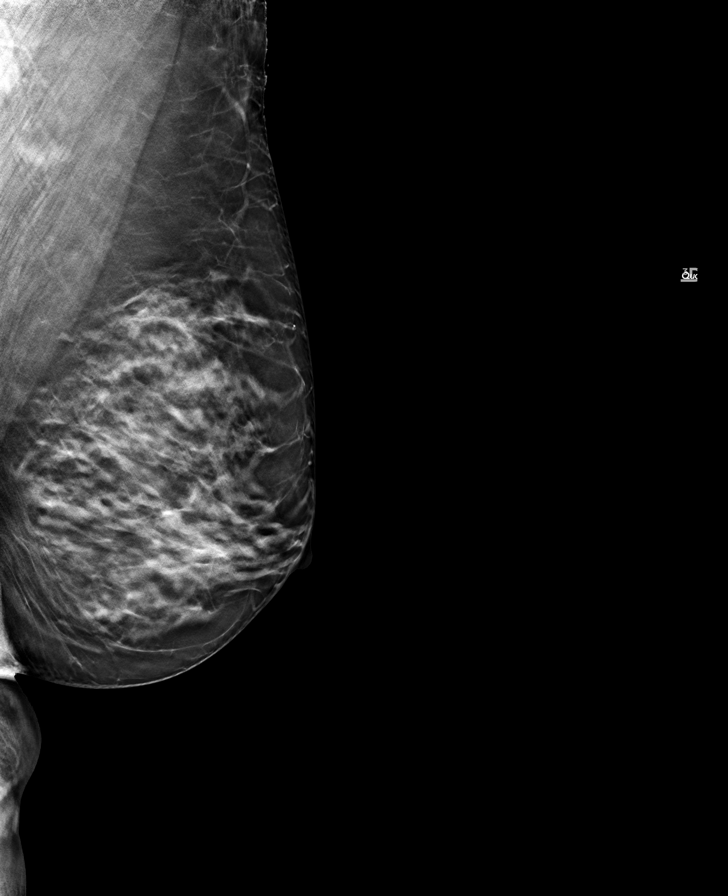

[8 of 24 positions shown; findings below may reference images not displayed]

FINDINGS: No suspicious mass, calcifications, or area of architectural 
distortion in either breast.
IMPRESSION: Stable exam. Given the breast density consideration should be given to 
supplemental screening with whole breast MRI. 
(BI-RADS 2) Benign findings. Routine mammographic follow-up is recommended.

## 2023-04-15 IMAGING — CT CT THORACIC SPINE WITHOUT CONTRAST
3 of 6 series · 10 of 33 positions shown, 12 images · non-contrast
Comparison: MRI lumbar spine from September 20, 2022.

________________________________________________________________________________________________ 
CT THORACIC SPINE WITHOUT CONTRAST, 04/15/2023 [DATE]: 
CLINICAL INDICATION: Pain in thoracic spine, Rule Out Thnp Left Side Lower 
Throacic Spine L1-L2 Possible Left Foranial Stenosis 
A search for DICOM formatted images was conducted for prior CT imaging studies 
completed at a non-affiliated media free facility.
TECHNIQUE: The thoracic spine was scanned from C7 through L1 vertebra without 
contrast on a high-resolution CT scanner using dose reduction techniques. 
Routine MPR images were performed.

[Series 4: axial · axial · 0.39mm/px · z∈[-225,-116]mm · 2 of 328 slices shown, 3 images]
[im 110/328  soft-tissue]
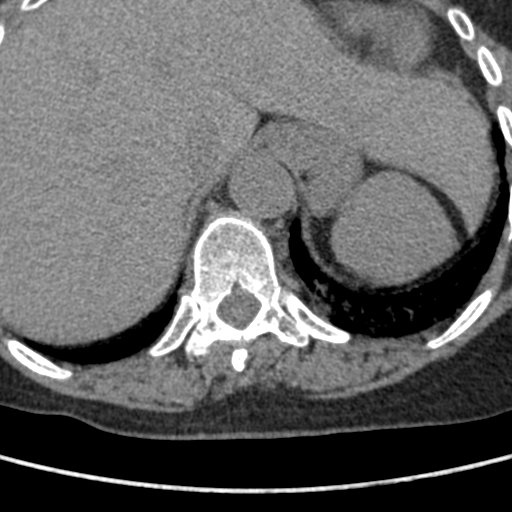
[im 110/328  bone]
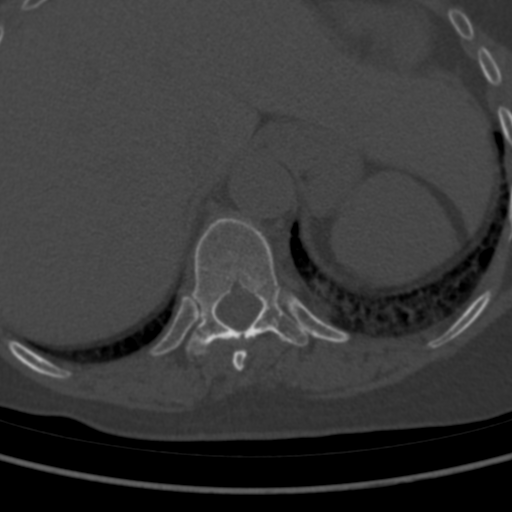
[im 219/328  bone]
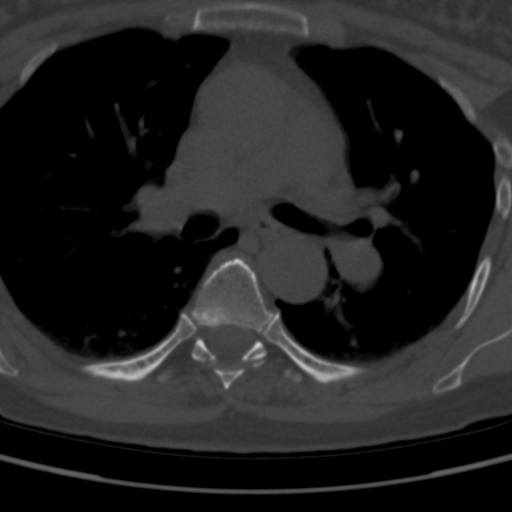

[Series 6: cor · coronal · 0.37mm/px · 3 of 52 slices shown]
[im 11/52  bone]
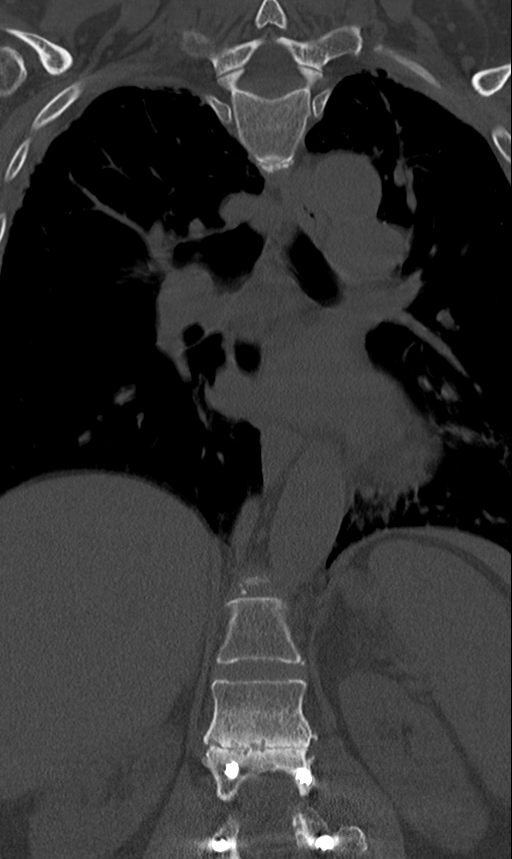
[im 21/52  bone]
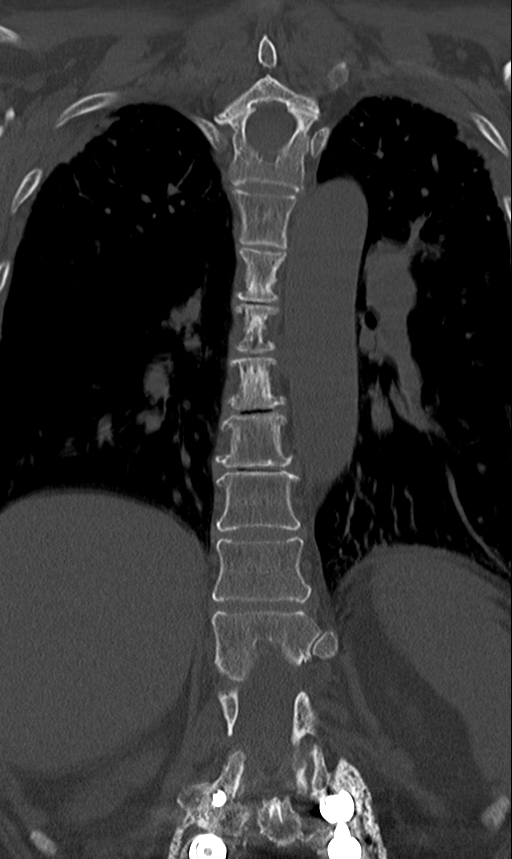
[im 31/52  bone]
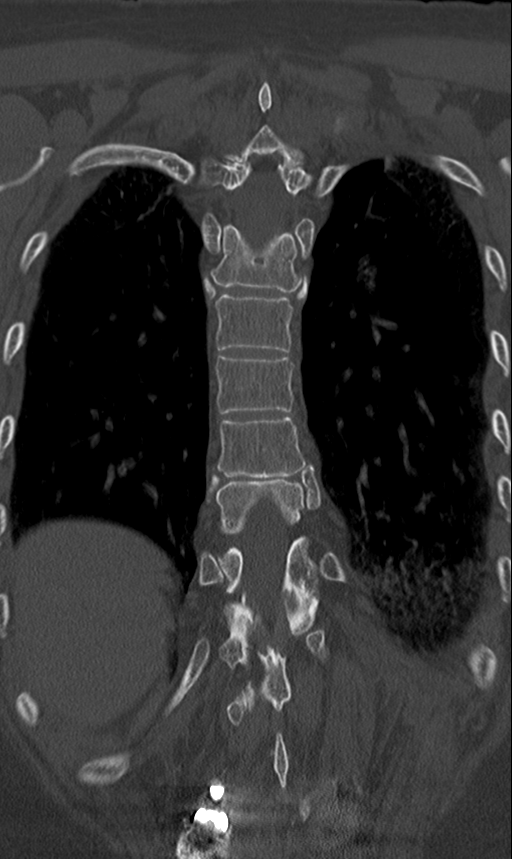

[Series 8: sag st · sagittal · 0.34mm/px · 5 of 66 slices shown, 6 images]
[im 22/66  bone]
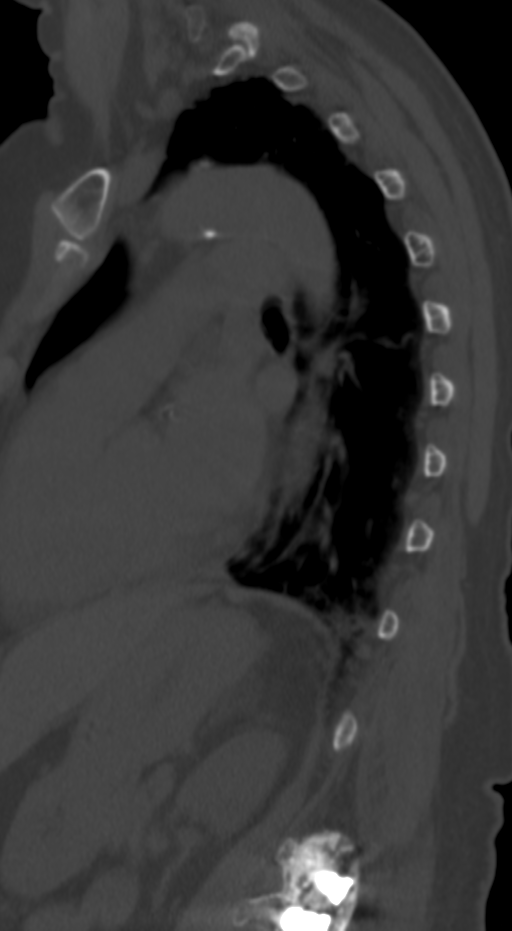
[im 28/66  bone]
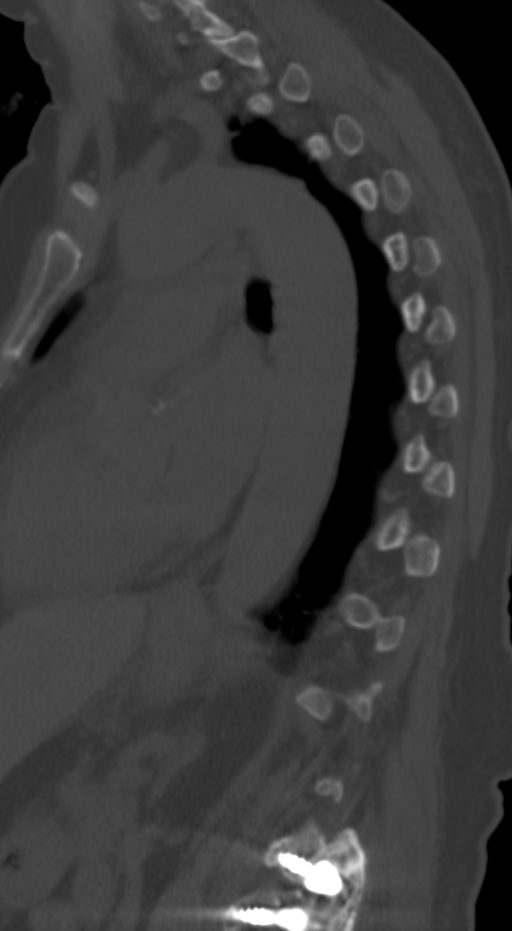
[im 33/66  soft-tissue]
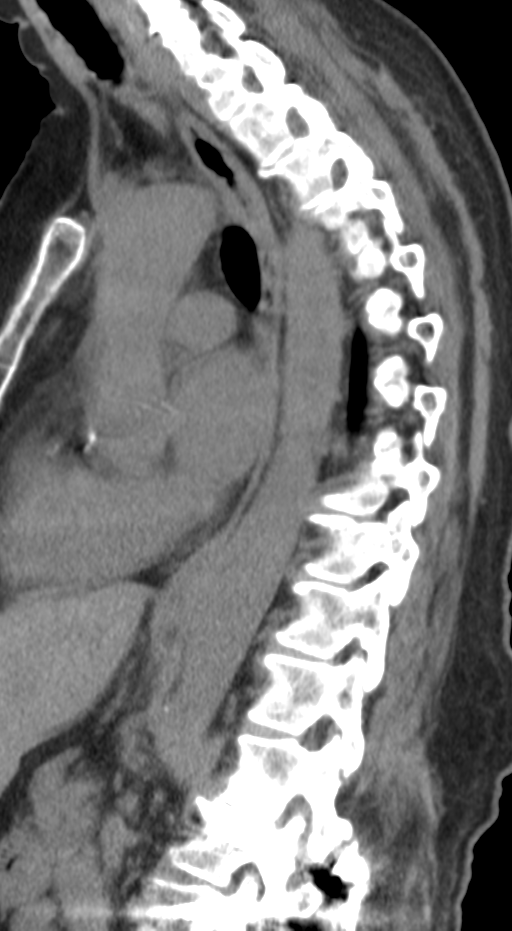
[im 33/66  bone]
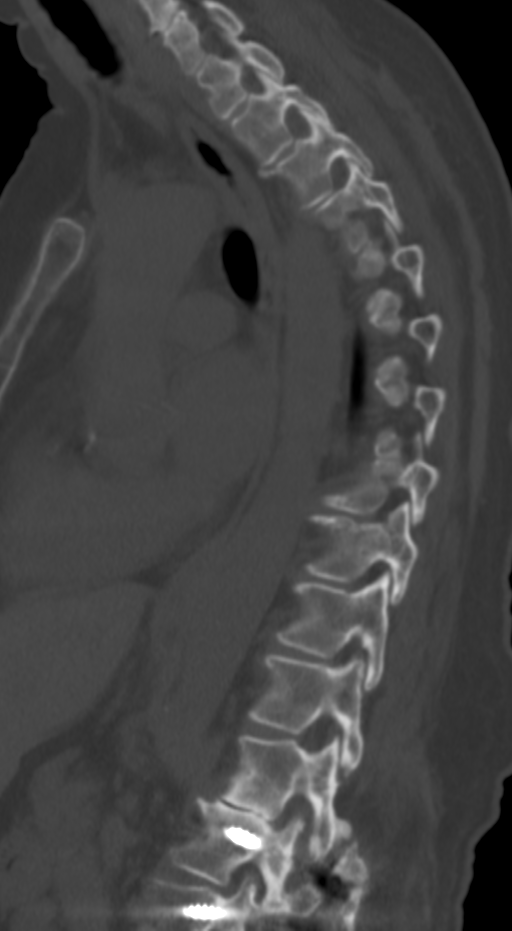
[im 38/66  bone]
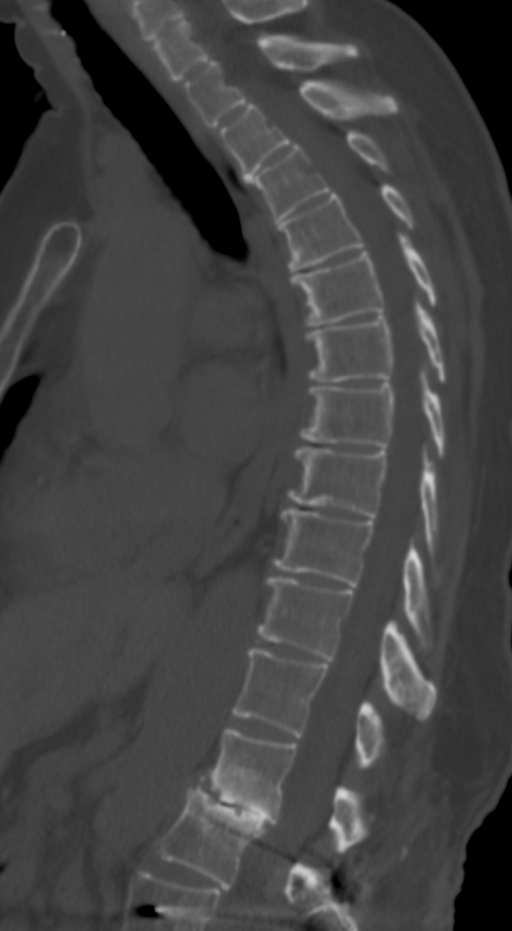
[im 44/66  bone]
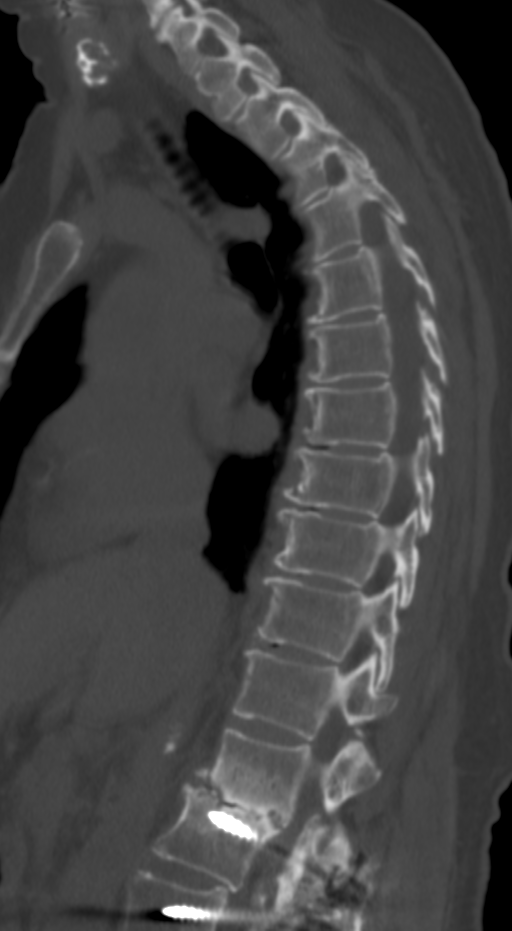

[10 of 33 positions shown; findings below may reference images not displayed]

Count of known CT and Cardiac Nuclear Medicine studies performed in the previous 
12 months = 0.
FINDINGS: --------------------------------------------------------------------------- 
GENERAL: 
Surgical fusion hardware noted at L1 and L2. 
Alignment demonstrates exaggeration of normal thoracic kyphosis and mild 
rightward curvature. Mild anterolisthesis of T2 on T3. Mild retrolisthesis of 
T12 on L1. No acute fracture. No focal suspect lytic or blastic lesion. 
Visualized extraspinal soft tissues reveal surgical clips in the right neck with 
calcified right thyroid nodule present. 
--------------------------------------------------------------------------- 
SEGMENTAL: 
Multilevel discogenic/degenerative changes are present. Severe disc space 
narrowing at T12-L1 with sclerotic endplate change. This is stable compared to 
prior MRI lumbar spine. 
Levels with severe central canal narrowing: None. 
Levels with severe neural foraminal narrowing: None. 
---------------------------------------------------------------------------
IMPRESSION: 1.  No severe stenosis seen in the thoracic spine on CT, patient is scheduled 
for MRI of thoracic spine. 
2.  Severe disc space narrowing at T12-L1. 
3.  Right thyroid nodule adjacent to surgical clips, advise ultrasound 
correlation. 
RADIATION DOSE REDUCTION: All CT scans are performed using radiation dose 
reduction techniques, when applicable.  Technical factors are evaluated and 
adjusted to ensure appropriate moderation of exposure.  Automated dose 
management technology is applied to adjust the radiation doses to minimize 
exposure while achieving diagnostic quality images.

## 2023-05-01 IMAGING — MR MRI THORACIC SPINE WITHOUT CONTRAST
5 of 9 series · 17 of 48 positions shown · IV contrast (gadolinium)
Comparison: CT thoracic spine April 15, 2023. Lumbar spine x-ray December 23, 2022. MRI lumbar spine September 20, 2022.

________________________________________________________________________________________________ 
MRI THORACIC SPINE WITHOUT CONTRAST, 05/01/2023 [DATE]: 
CLINICAL INDICATION: Pain in thoracic spine , evaluate for foraminal stenosis. 
Chronic low back pain with occasional anterior leg pain to the knee. Prior 
lumbar fusion.
TECHNIQUE: Multiplanar, multiecho position MR images of the thoracic spine were 
performed without intravenous gadolinium enhancement. Patient was scanned on a 
1.5T magnet.

[Series 101: t2_sag_count · sagittal · 4.0mm · 0.62mm/px · 3 of 22 slices shown]
[im 1/22]
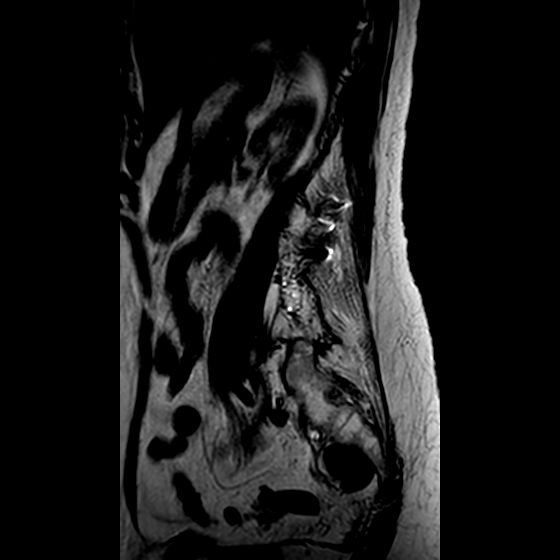
[im 11/22]
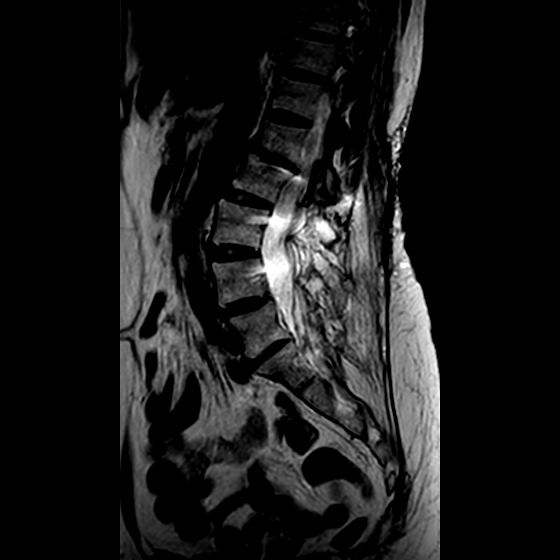
[im 22/22]
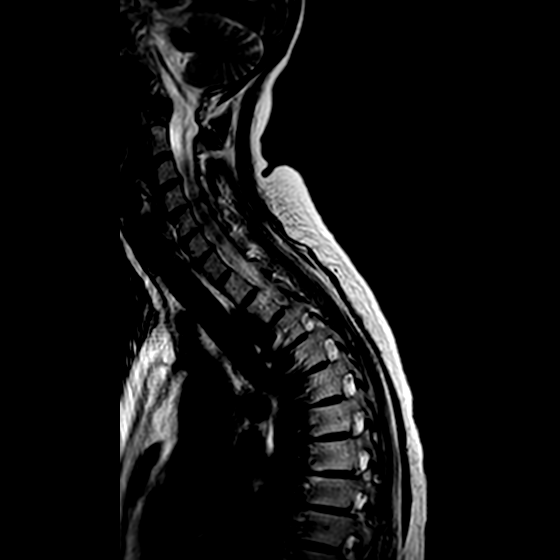

[Series 102: T2 · sagittal · 4.0mm · 0.62mm/px · 1 of 11 slices shown (1 of 2)]
[im 1/11]
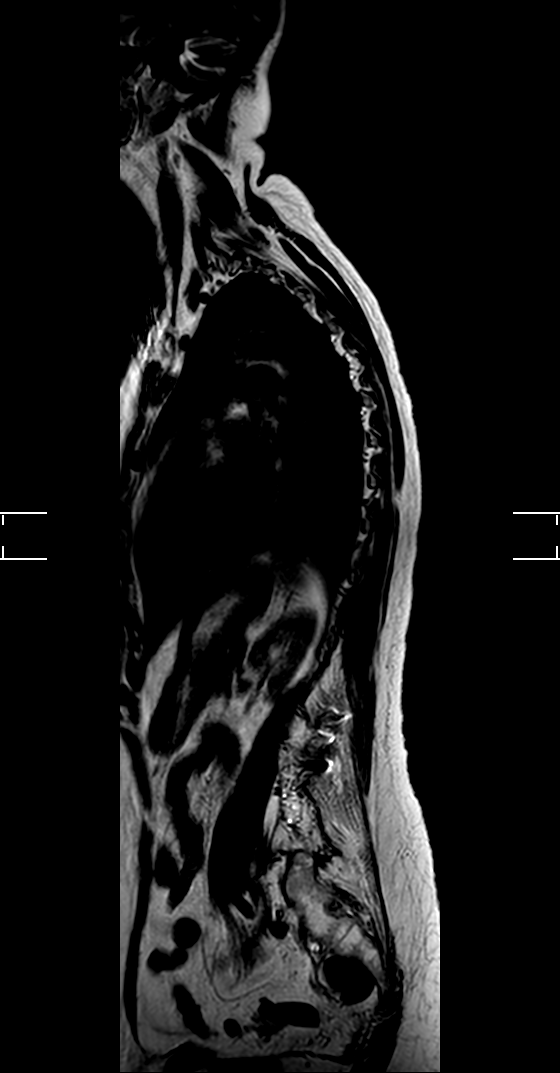

[Series 201: t2_cor_count · coronal · 4.0mm · 0.61mm/px · 2 of 15 slices shown]
[im 1/15]
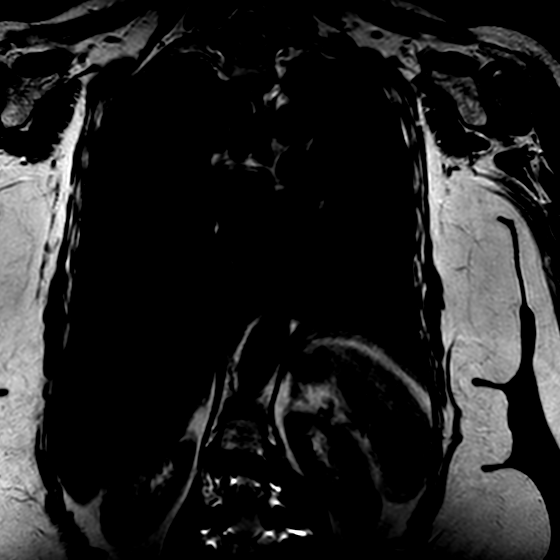
[im 15/15]
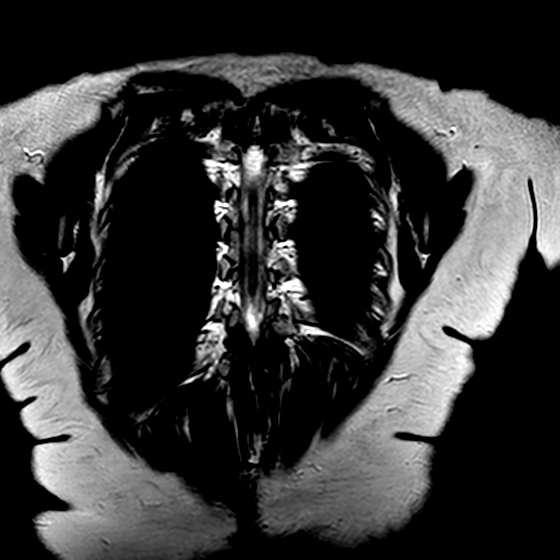

[Series 301: T1 · sagittal · 3.0mm · 0.54mm/px · 3 of 21 slices shown]
[im 1/21]
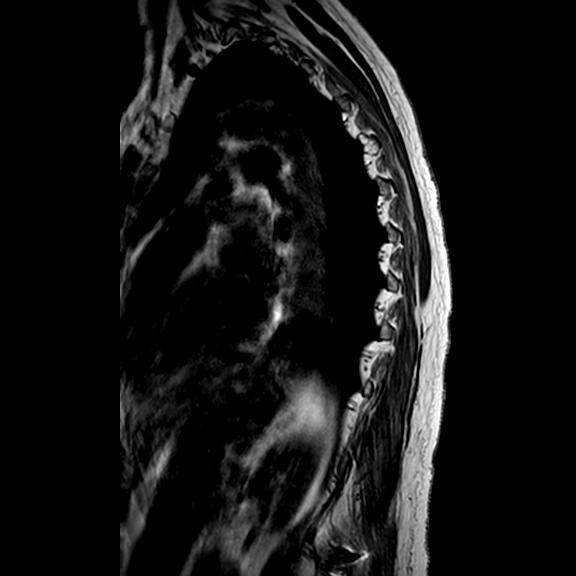
[im 11/21]
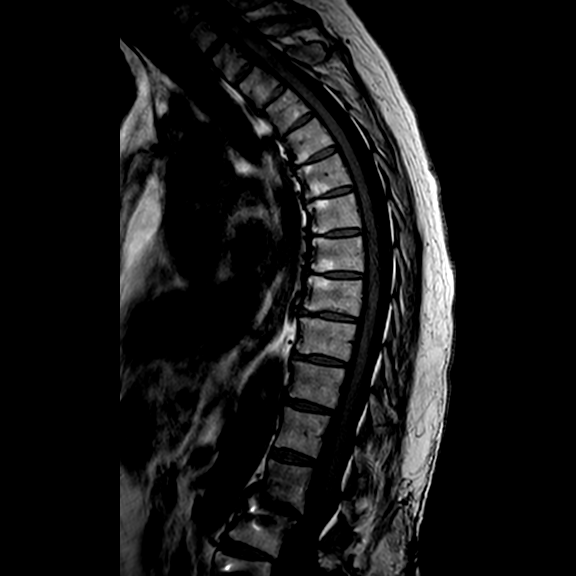
[im 21/21]
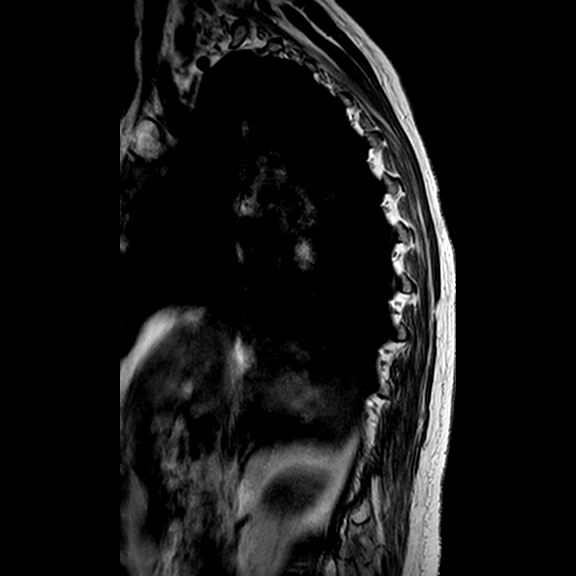

[Series 601: T2 · axial · 4.0mm · 0.42mm/px · z∈[-247,-10]mm · 8 of 72 slices shown (2 of 2)]
[im 1/72]
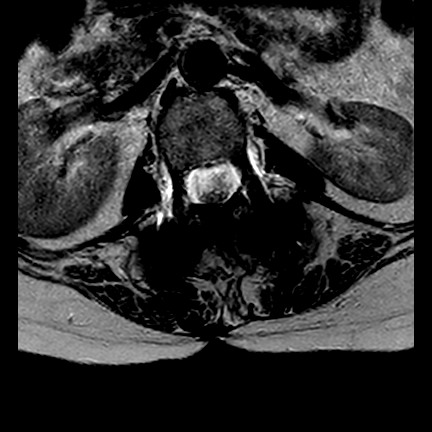
[im 8/72]
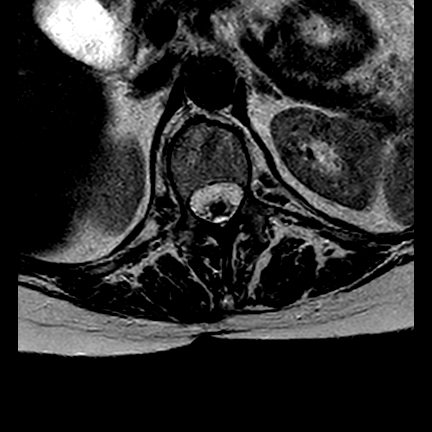
[im 24/72]
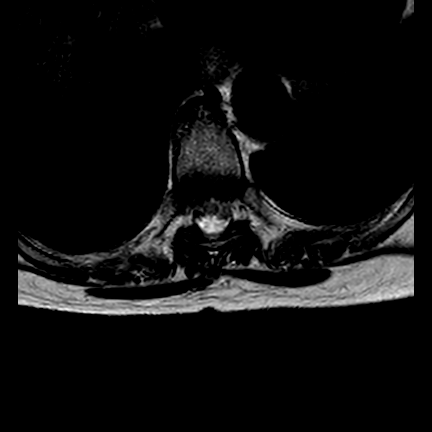
[im 32/72]
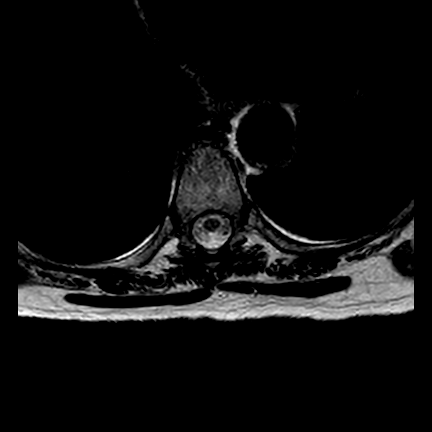
[im 40/72]
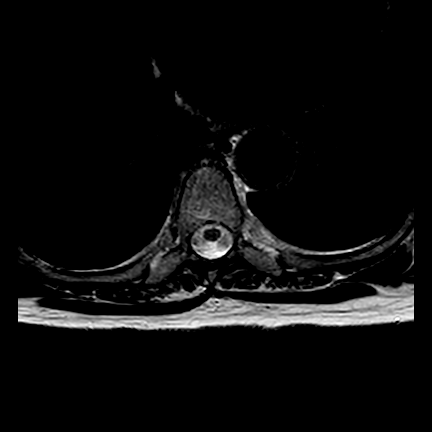
[im 48/72]
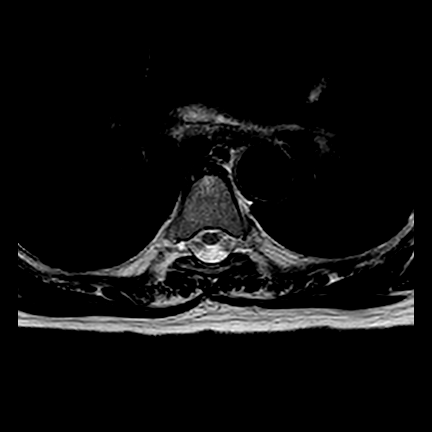
[im 64/72]
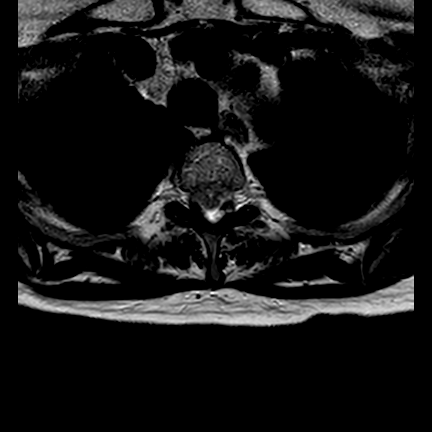
[im 72/72]
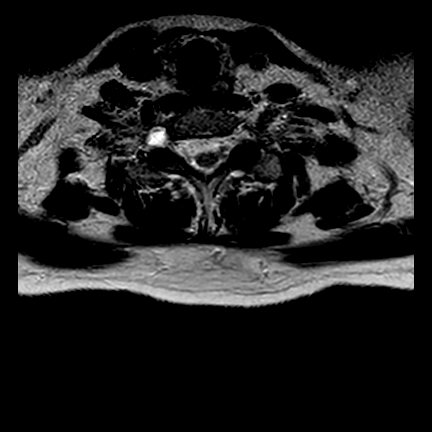

[17 of 48 positions shown; findings below may reference images not displayed]

FINDINGS: Sagittal localizer demonstrates 12 thoracic and 4 lumbar type vertebral bodies. 
The L5 level is sacralized. There are pedicle screws noted L1-L3. Prior CT 
demonstrated that the T12 level is nonrib-bearing. Tarlov cyst. 
-------------------------------------------------------------------------------- 
------ 
GENERAL: 
ALIGNMENT: Normal coronal alignment. Accentuated thoracic kyphosis. 
VERTEBRAL BODY HEIGHT: Normal.  
MARROW SIGNAL: No evidence of marrow infiltrative process. There are prominent 
type I Modic endplate signal changes T4-T5, T5-T6, T6-T7 and T7-T8 and T8-T9 
levels. 
CORD SIGNAL: Normal. 
ADDITIONAL FINDINGS: None. 
-------------------------------------------------------------------------------- 
------ 
RELEVANT SEGMENTAL (levels with severe stenosis or significant findings): 
C7-T1: Bilateral nerve root sleeve cyst. 
T10-T11: Borderline bilateral foraminal narrowing. 
T12-L1: Loss of disc height worst to the left. Degenerative endplate 
irregularity. Grade 1 retrolisthesis. Mild canal stenosis. Mild right foraminal 
narrowing. Mild to moderate left foraminal narrowing. Facet arthropathy. 
Additional scattered discogenic/degenerative changes are noted. 
-------------------------------------------------------------------------------- 
------
IMPRESSION: Transitional anatomy. Please see above discussion. If intervention is 
entertained, correlate with radiographic guidance. 
Thoracolumbar spondylosis at T12-L1 with mild right and mild-to-moderate left 
foraminal narrowing.

## 2024-03-07 ENCOUNTER — Encounter (INDEPENDENT_AMBULATORY_CARE_PROVIDER_SITE_OTHER): Payer: Self-pay

## 2024-04-13 ENCOUNTER — Encounter (INDEPENDENT_AMBULATORY_CARE_PROVIDER_SITE_OTHER): Payer: Self-pay

## 2024-05-10 ENCOUNTER — Encounter (INDEPENDENT_AMBULATORY_CARE_PROVIDER_SITE_OTHER): Payer: Self-pay
